# Patient Record
Sex: Female | Born: 1950 | Race: Black or African American | Hispanic: No | Smoking: Former smoker
Health system: Southern US, Community
[De-identification: ages and names within clinical notes are randomized; demographics above are authoritative.]

## PROBLEM LIST (undated history)

## (undated) DIAGNOSIS — N814 Uterovaginal prolapse, unspecified: Secondary | ICD-10-CM

## (undated) DIAGNOSIS — N8110 Cystocele, unspecified: Secondary | ICD-10-CM

## (undated) DIAGNOSIS — I10 Essential (primary) hypertension: Secondary | ICD-10-CM

## (undated) DIAGNOSIS — M1990 Unspecified osteoarthritis, unspecified site: Secondary | ICD-10-CM

## (undated) HISTORY — PX: BREAST SURGERY: SHX581

## (undated) HISTORY — DX: Cystocele, unspecified: N81.10

## (undated) HISTORY — PX: OOPHORECTOMY: SHX86

## (undated) HISTORY — PX: HERNIA REPAIR: SHX51

## (undated) HISTORY — PX: OTHER SURGICAL HISTORY: SHX169

## (undated) HISTORY — PX: KNEE ARTHROSCOPY: SUR90

---

## 1998-05-23 ENCOUNTER — Ambulatory Visit (HOSPITAL_COMMUNITY): Admission: RE | Admit: 1998-05-23 | Discharge: 1998-05-23 | Payer: Self-pay | Admitting: *Deleted

## 1998-11-23 ENCOUNTER — Ambulatory Visit (HOSPITAL_COMMUNITY): Admission: RE | Admit: 1998-11-23 | Discharge: 1998-11-23 | Payer: Self-pay

## 1999-12-22 ENCOUNTER — Ambulatory Visit (HOSPITAL_COMMUNITY): Admission: RE | Admit: 1999-12-22 | Discharge: 1999-12-22 | Payer: Self-pay | Admitting: *Deleted

## 1999-12-22 ENCOUNTER — Encounter: Payer: Self-pay | Admitting: *Deleted

## 2000-11-26 HISTORY — PX: REDUCTION MAMMAPLASTY: SUR839

## 2000-12-30 ENCOUNTER — Ambulatory Visit (HOSPITAL_COMMUNITY): Admission: RE | Admit: 2000-12-30 | Discharge: 2000-12-30 | Payer: Self-pay | Admitting: Internal Medicine

## 2000-12-30 ENCOUNTER — Encounter: Payer: Self-pay | Admitting: Internal Medicine

## 2001-12-31 ENCOUNTER — Ambulatory Visit (HOSPITAL_COMMUNITY): Admission: RE | Admit: 2001-12-31 | Discharge: 2001-12-31 | Payer: Self-pay | Admitting: Internal Medicine

## 2001-12-31 ENCOUNTER — Encounter: Payer: Self-pay | Admitting: Internal Medicine

## 2003-09-24 ENCOUNTER — Ambulatory Visit (HOSPITAL_COMMUNITY): Admission: RE | Admit: 2003-09-24 | Discharge: 2003-09-24 | Payer: Self-pay | Admitting: Internal Medicine

## 2004-05-17 ENCOUNTER — Ambulatory Visit (HOSPITAL_COMMUNITY): Admission: RE | Admit: 2004-05-17 | Discharge: 2004-05-17 | Payer: Self-pay | Admitting: Family Medicine

## 2004-06-13 ENCOUNTER — Encounter (INDEPENDENT_AMBULATORY_CARE_PROVIDER_SITE_OTHER): Payer: Self-pay | Admitting: Specialist

## 2004-06-13 ENCOUNTER — Encounter (INDEPENDENT_AMBULATORY_CARE_PROVIDER_SITE_OTHER): Payer: Self-pay | Admitting: *Deleted

## 2004-06-13 ENCOUNTER — Observation Stay (HOSPITAL_COMMUNITY): Admission: RE | Admit: 2004-06-13 | Discharge: 2004-06-14 | Payer: Self-pay | Admitting: Obstetrics and Gynecology

## 2004-09-27 ENCOUNTER — Ambulatory Visit (HOSPITAL_COMMUNITY): Admission: RE | Admit: 2004-09-27 | Discharge: 2004-09-27 | Payer: Self-pay | Admitting: Obstetrics and Gynecology

## 2005-04-09 ENCOUNTER — Other Ambulatory Visit: Admission: RE | Admit: 2005-04-09 | Discharge: 2005-04-09 | Payer: Self-pay | Admitting: Obstetrics and Gynecology

## 2005-10-09 ENCOUNTER — Ambulatory Visit (HOSPITAL_COMMUNITY): Admission: RE | Admit: 2005-10-09 | Discharge: 2005-10-09 | Payer: Self-pay | Admitting: Oncology

## 2006-03-15 ENCOUNTER — Ambulatory Visit: Payer: Self-pay | Admitting: Gastroenterology

## 2006-03-25 ENCOUNTER — Encounter (INDEPENDENT_AMBULATORY_CARE_PROVIDER_SITE_OTHER): Payer: Self-pay | Admitting: Specialist

## 2006-03-25 ENCOUNTER — Ambulatory Visit: Payer: Self-pay | Admitting: Gastroenterology

## 2006-10-10 ENCOUNTER — Ambulatory Visit (HOSPITAL_COMMUNITY): Admission: RE | Admit: 2006-10-10 | Discharge: 2006-10-10 | Payer: Self-pay | Admitting: Obstetrics and Gynecology

## 2006-12-02 ENCOUNTER — Other Ambulatory Visit: Admission: RE | Admit: 2006-12-02 | Discharge: 2006-12-02 | Payer: Self-pay | Admitting: Obstetrics and Gynecology

## 2007-10-15 ENCOUNTER — Ambulatory Visit (HOSPITAL_COMMUNITY): Admission: RE | Admit: 2007-10-15 | Discharge: 2007-10-15 | Payer: Self-pay | Admitting: Obstetrics and Gynecology

## 2007-12-10 ENCOUNTER — Other Ambulatory Visit: Admission: RE | Admit: 2007-12-10 | Discharge: 2007-12-10 | Payer: Self-pay | Admitting: Obstetrics and Gynecology

## 2008-10-19 ENCOUNTER — Ambulatory Visit (HOSPITAL_COMMUNITY): Admission: RE | Admit: 2008-10-19 | Discharge: 2008-10-19 | Payer: Self-pay | Admitting: Obstetrics and Gynecology

## 2009-03-08 ENCOUNTER — Encounter: Payer: Self-pay | Admitting: Obstetrics and Gynecology

## 2009-03-08 ENCOUNTER — Ambulatory Visit: Payer: Self-pay | Admitting: Obstetrics and Gynecology

## 2009-03-08 ENCOUNTER — Other Ambulatory Visit: Admission: RE | Admit: 2009-03-08 | Discharge: 2009-03-08 | Payer: Self-pay | Admitting: Obstetrics and Gynecology

## 2009-06-08 ENCOUNTER — Ambulatory Visit: Payer: Self-pay | Admitting: Obstetrics and Gynecology

## 2009-06-13 ENCOUNTER — Ambulatory Visit: Payer: Self-pay | Admitting: Obstetrics and Gynecology

## 2009-07-03 ENCOUNTER — Ambulatory Visit (HOSPITAL_COMMUNITY): Admission: RE | Admit: 2009-07-03 | Discharge: 2009-07-03 | Payer: Self-pay | Admitting: Orthopedic Surgery

## 2009-07-20 ENCOUNTER — Ambulatory Visit (HOSPITAL_BASED_OUTPATIENT_CLINIC_OR_DEPARTMENT_OTHER): Admission: RE | Admit: 2009-07-20 | Discharge: 2009-07-20 | Payer: Self-pay | Admitting: Orthopedic Surgery

## 2009-10-26 ENCOUNTER — Ambulatory Visit (HOSPITAL_COMMUNITY): Admission: RE | Admit: 2009-10-26 | Discharge: 2009-10-26 | Payer: Self-pay | Admitting: Emergency Medicine

## 2010-03-15 ENCOUNTER — Other Ambulatory Visit: Admission: RE | Admit: 2010-03-15 | Discharge: 2010-03-15 | Payer: Self-pay | Admitting: Obstetrics and Gynecology

## 2010-03-15 ENCOUNTER — Ambulatory Visit: Payer: Self-pay | Admitting: Obstetrics and Gynecology

## 2010-11-23 ENCOUNTER — Ambulatory Visit (HOSPITAL_COMMUNITY)
Admission: RE | Admit: 2010-11-23 | Discharge: 2010-11-23 | Payer: Self-pay | Source: Home / Self Care | Attending: Obstetrics and Gynecology | Admitting: Obstetrics and Gynecology

## 2011-04-10 NOTE — Op Note (Signed)
Holly Herring, Holly Herring             ACCOUNT NO.:  [id number]   MEDICAL RECORD NO.:  [id number]          PATIENT TYPE:  AMB   LOCATION:  [facility]                         FACILITY:  [facility]   PHYSICIAN:  Georges Lynch. Gioffre, M.D.DATE OF BIRTH:  July 29, 1951   DATE OF PROCEDURE:  07/20/2009  DATE OF DISCHARGE:                               OPERATIVE REPORT   SURGEON:  Dr. Darrelyn Hillock.   ASSISTANT:  Nurse.   PREOPERATIVE DIAGNOSES:  1. Severe degenerative arthritis, left knee.  2. Complex tear of the posterior horn of medial meniscus, left knee.   POSTOPERATIVE DIAGNOSES:  1. Severe degenerative arthritis, left knee.  2. Complex tear of the posterior horn of medial meniscus, left knee.   OPERATION:  1. Diagnostic diagnostic arthroscopy, left knee.  2. Medial meniscectomy, left knee.  3. Abrasion chondroplasty medial femoral condyle, left knee.  4. Synovectomy suprapatellar pouch, left knee.   PROCEDURE:  Under general anesthesia, routine orthopedic prep and  draping of the left lower extremity was carried out.  She was given 500  mg of vancomycin IV.  After prepping and draping was carried out, a  small incision was made in the suprapatellar pouch, inflow cannula was  inserted and knee was distended with saline.  Another small punctate  incision was made in the anterolateral joint.  The arthroscope was  entered from a lateral approach and a complete diagnostic arthroscopy  was carried out.  I went up in the suprapatellar pouch, she had severe  synovitis.  I introduced the ArthroCare and did a synovectomy.  Following that, I went down the lateral joint.  She had definite  arthritic changes, but no meniscal tear.  The cruciates were intact.  I  went over the medial joint, she had a severe complex tear of the  posterior horn of the medial meniscus.  I introduced a shaver suction  device followed by the ArthroCare and did a medial meniscectomy.  There  was no other abnormalities.  The lateral  meniscus was definitely intact.  I did an abrasion chondroplasty utilizing a shaver suction device  followed by the ArthroCare over the medial femoral condyle.  Note, she  had severe changes in the tibial plateau and the medial femoral condyle  and someday she will need a total knee replacement.  The wound was  thoroughly irrigated out with saline solution.  I closed all three  punctate incisions with 3-0 nylon suture.  I injected 30 mL of quarter-  percent Marcaine with epinephrine in the knee joint.  A sterile  Neosporin bundle dressing was applied and the patient left the operative  room in satisfactory condition.   POSTOPERATIVE CARE:  1. She will be on Percocet 10/650 one every 4 hours p.r.n. for pain.  2. She will be on aspirin 325 mg b.i.d. today and b.i.d. for 2 weeks      on a daily basis.  3. She will ambulate with a walker partial to full weightbearing as      tolerated.  4. She will see me in the office in 12-14 days or prior to that for  suture removal.           ______________________________  Georges Lynch. Darrelyn Hillock, M.D.     RAG/MEDQ  D:  07/20/2009  T:  07/20/2009  Job:  [id number]

## 2011-04-13 NOTE — Discharge Summary (Signed)
NAME:  Holly Herring, Holly Herring                       ACCOUNT NO.:  [id number]   MEDICAL RECORD NO.:  [id number]                   PATIENT TYPE:  OBV   LOCATION:  9309                                 FACILITY:  [facility]   PHYSICIAN:  Daniel L. Eda Paschal, M.D.           DATE OF BIRTH:  02-Jun-1951   DATE OF ADMISSION:  06/13/2004  DATE OF DISCHARGE:                                 DISCHARGE SUMMARY   HOSPITAL COURSE:  The patient is a 60 year old female with right lower  quadrant pain and a large ovarian neoplasm who entered the hospital for  surgery.  On the day of admission she was taken to the operating room and  open laparoscopy was performed.  Findings were pelvic adhesive disease,  fibroids, and a large right ovarian neoplasm.  As per our discussion  preoperatively a laparoscopic right salpingo-oophorectomy was performed.  She was kept overnight for observation, by the next morning was doing well,  and was ready for discharge.   DISCHARGE MEDICATIONS:  Percocet for pain relief.   DISCHARGE DIET:  Regular.   DISCHARGE ACTIVITY:  Ambulatory.   Final pathology report is not available at the time of dictation.   CONDITION ON DISCHARGE:  Improved.   DISCHARGE DIAGNOSES:  1. Pelvic adhesive disease.  2. Fibroids.  3. Right ovarian neoplasm.                                               Daniel L. Eda Paschal, M.D.    Tonette Bihari  D:  06/14/2004  T:  06/14/2004  Job:  [id number]

## 2011-04-13 NOTE — Op Note (Signed)
NAME:  Holly Herring, Holly Herring                       ACCOUNT NO.:  [id number]   MEDICAL RECORD NO.:  [id number]                   PATIENT TYPE:  OBV   LOCATION:  9309                                 FACILITY:  [facility]   PHYSICIAN:  Daniel L. Eda Paschal, M.D.           DATE OF BIRTH:  Feb 19, 1951   DATE OF PROCEDURE:  06/13/2004  DATE OF DISCHARGE:                                 OPERATIVE REPORT   PREOPERATIVE DIAGNOSIS:  Right ovarian neoplasm, right lower quadrant pain.   OPERATIONS:  1. Diagnostic laparoscopy with lysis of pelvic adhesions.  2. Right salpingo-oophorectomy.   SURGEON:  Daniel L. Eda Paschal, M.D.   FIRST ASSISTANT:  Timothy P. Fontaine, M.D.   FINDINGS:  At the time of surgery, the patient's uterus was involved with  multiple fibroids.  There were also adhesions on the left adnexal side,  obscuring the left adnexa; although by history it had been removed.  The cul-  de-sac had adhesions as well, although you could see the cul-de-sac.  The  patient's right ovary was enlarged by an ovarian neoplasm of about 5.5 cm,  that felt both firm and cystic.  There were adhesions from a previous  laparotomy, with little windows between the top of the peritoneum and the  fundus of the uterus.  There were adhesions obscuring the vesicouterine fold  of the peritoneum from previous surgery.  The ileocecal junction was  identified and the appendix appeared to be normal.   DESCRIPTION OF PROCEDURE:  After adequate general endotracheal anesthesia,  the patient was placed in the dorsal lithotomy position; prepped and draped  in the usual sterile manner.  A Foley catheter was inserted into the bladder  and a Hulka catheter was inserted into the uterus.  Two attempts were made  to introduce the Veress needle subumbilically to create a pneumoperitoneum;  both times it was felt that the peritoneum had been entered successfully;  however, we could not get good flow of gas.  Therefore the procedure  was  terminated and an open laparoscopy was done.   The skin incision was extended subumbilically from side to side.  It was cut  down to the fascia, which was easily identified.  The fascia was whipped  with a 0 Vicryl.  The peritoneum was then opened by sharp dissection.  The  Hasson catheter was placed and the Vicryl was tied around it to create a  good seal.  The operating laparoscope was placed and a pneumoperitoneum was  then created without any difficulty.  A 5 mm port was placed suprapubically  and 12 mm port was placed in the right lower quadrant.   Some of the adhesions, that obscured a good view of the right adnexa, were  taken down with sharp dissection by scissors -- without any bleeding  encountered.  Also, there were some dense adhesions still on the left side,  where the adnexa by history had been surgically removed.  Once again we  could not see an adnexa on the left, but there was adhesion there as well as  the uterus went all the way to the sidewall.  The patient's uterus was  enlarged by fibroids.   The right adnexa, however, was fairly free; although, there were some  adhesions which were lysed.  The right adnexa was then elevated. The ureter  could be identified.  The right infundibulopelvic ligament was bipolar  cauterized and cut.  Working our way down the broad ligament and staying  close to the adnexa, attachments of the adnexa to the broad ligament were  bipolar cauterized and cut, until the right adnexa was completely free.   An Endo pouch was placed, and the specimen was placed in it.  In trying to  remove it, the specimen was very large and the fascial incisions had to be  extended more in order to get it out -- but, finally this was successful and  the specimen was removed.   The pelvis was inspected and there was absolutely no bleeding.  Please note  that peritoneal washings had been obtained prior to obtaining the specimen;  these were sent to  pathology for tissue diagnosis separately.   At this point the subumbilical fascial incision was closed with the whip  stitch sutured in place before; just by pulling up on it the incision was  completely closed.  That skin incision was closed with a 3-0 plain  subcuticular suture.  The right lower quadrant fascial incision was closed  with a running 0 Vicryl, and then the skin was closed with a 3-0 plain  subcuticular suture.  Steri-Strips were placed in the other incision.   Sponge, needle and instrument counts were correct.  Estimated blood loss was  less than 100 cc, with no replaced.  The patient tolerated the procedure  well and left the operating room in satisfactory condition.  Drained clear  urine from her Foley catheter, which was then removed.                                               Daniel L. Eda Paschal, M.D.    Tonette Bihari  D:  06/13/2004  T:  06/14/2004  Job:  [id number]

## 2011-04-19 ENCOUNTER — Encounter: Payer: Self-pay | Admitting: Gastroenterology

## 2011-07-16 ENCOUNTER — Other Ambulatory Visit: Payer: Self-pay | Admitting: Obstetrics and Gynecology

## 2011-10-15 ENCOUNTER — Other Ambulatory Visit: Payer: Self-pay | Admitting: Obstetrics and Gynecology

## 2011-10-15 NOTE — Telephone Encounter (Signed)
RX DENIED. NO CHECK UP SINCE April 2011. PT. NEVER SET UP AN APPT.

## 2011-10-26 ENCOUNTER — Other Ambulatory Visit: Payer: Self-pay | Admitting: Internal Medicine

## 2011-10-26 DIAGNOSIS — Z1231 Encounter for screening mammogram for malignant neoplasm of breast: Secondary | ICD-10-CM

## 2011-11-30 ENCOUNTER — Telehealth: Payer: Self-pay | Admitting: *Deleted

## 2011-11-30 NOTE — Telephone Encounter (Signed)
Pt is currently unemployed last office visit 4/20/211 her rx for estrace 1 mg has expired. Pt would like to know what you would recommend to take OTC for hot flashes? No sleep in last 4 nights due to the flashes. Please advise

## 2011-11-30 NOTE — Telephone Encounter (Signed)
Pt informed with the below note. 

## 2011-11-30 NOTE — Telephone Encounter (Signed)
Health food stores sell a variety of products for hot flashes. Different things work for different people. I've had the best results with soy  or black cohash.

## 2011-12-03 ENCOUNTER — Ambulatory Visit (HOSPITAL_COMMUNITY)
Admission: RE | Admit: 2011-12-03 | Discharge: 2011-12-03 | Disposition: A | Discharge status: Home / Self Care | Payer: No Typology Code available for payment source | Source: Ambulatory Visit | Attending: Internal Medicine | Admitting: Internal Medicine

## 2011-12-03 DIAGNOSIS — Z1231 Encounter for screening mammogram for malignant neoplasm of breast: Secondary | ICD-10-CM

## 2011-12-07 ENCOUNTER — Encounter: Payer: Self-pay | Admitting: Family Medicine

## 2011-12-07 ENCOUNTER — Ambulatory Visit
Admission: RE | Admit: 2011-12-07 | Discharge: 2011-12-07 | Disposition: A | Discharge status: Home / Self Care | Payer: Self-pay | Source: Ambulatory Visit | Attending: Family Medicine | Admitting: Family Medicine

## 2011-12-07 ENCOUNTER — Ambulatory Visit (INDEPENDENT_AMBULATORY_CARE_PROVIDER_SITE_OTHER): Payer: Self-pay | Admitting: Family Medicine

## 2011-12-07 VITALS — BP 138/80 | HR 86 | Ht 61.0 in | Wt 222.0 lb

## 2011-12-07 DIAGNOSIS — M1712 Unilateral primary osteoarthritis, left knee: Secondary | ICD-10-CM

## 2011-12-07 DIAGNOSIS — IMO0002 Reserved for concepts with insufficient information to code with codable children: Secondary | ICD-10-CM

## 2011-12-07 DIAGNOSIS — M1710 Unilateral primary osteoarthritis, unspecified knee: Secondary | ICD-10-CM

## 2011-12-07 MED ORDER — MELOXICAM 15 MG PO TABS: ORAL_TABLET | ORAL | Status: DC

## 2011-12-07 NOTE — Progress Notes (Signed)
  Subjective:    Patient ID: Holly Herring, female    DOB: 05/21/1951, 61 y.o.   MRN: [id number]  HPI  Holly Herring is coming complaining of left knee pain. This is a chronic condition.  Holly Herring had an arthroscopic surgery 2 years ago for the same condition with findings of degenerative changes in intra-articularly . Holly Herring states that the pain is in the anteromedial side of her knee. In the medial joint line. The pain is 5 intensity, no radiating, worse with weightbearing activities. No mechanical symptoms locking catching or giving away. He denies a numbness or tingling. Holly Herring has been taking Advil for pain control. Holly Herring also has knee sleeve and Holly Herring also has a cane. Mild swelling on and off. The last time Holly Herring had any images of her knees was 2 years ago.  There is no problem list on file for this patient.  Current Outpatient Prescriptions on File Prior to Visit  Medication Sig Dispense Refill  . estradiol (ESTRACE) 1 MG tablet TAKE ONE TABLET BY MOUTH EVERY DAY  30 tablet  0   Allergies  Allergen Reactions  . Penicillins Hives      Review of Systems  Constitutional: Negative for fever, chills, diaphoresis and fatigue.  Musculoskeletal: Positive for arthralgias and gait problem. Negative for back pain and joint swelling.  Neurological: Negative for weakness and numbness.       Objective:   Physical Exam  Constitutional: Holly Herring is oriented to person, place, and time. Holly Herring appears well-developed and well-nourished.       BP 138/80  Pulse 86  Ht 5\' 1"  (1.549 m)  Wt 222 lb (100.699 kg)  BMI 41.95 kg/m2   Pulmonary/Chest: Effort normal.  Musculoskeletal:       Left knee with intact skin, FROM. Patellofemoral crepitus present with flexion and extension. Patellofemoral compression  test +. TTP in medial joint line. No tenderness on the quad neither or patellar tendon. Ligaments intact. Lachman neg. Varus and valgus test at 0 and 30 degres neg Poor quad muscle definition.  Tight  hamstrings Normal gait with a limp favoring the left side      Neurological: Holly Herring is alert and oriented to person, place, and time.  Skin: Skin is warm. No rash noted. No erythema.  Psychiatric: Holly Herring has a normal mood and affect. Her behavior is normal.          Assessment & Plan:   1. Left knee DJD  DG Knee AP/LAT W/Sunrise Left   Ice massage twice a day mobic 1 tablet orally daily for 2 weeks Quad exercises Knee sleeve Knee x rays Weight loss Biking daily F/U in 4 weeks. We discussed the merits of a cortisone and intra-articular injection but the patient to wait on Holly Herring had x-rays of her knees.

## 2011-12-07 NOTE — Patient Instructions (Signed)
Ice massage twice a day mobic 1 tablet orally daily for 2 weeks Quad exercises Knee sleeve Knee x rays Weight loss Biking daily F/U in 4 weeks.

## 2011-12-26 ENCOUNTER — Encounter: Payer: Self-pay | Admitting: Family Medicine

## 2011-12-26 ENCOUNTER — Ambulatory Visit (INDEPENDENT_AMBULATORY_CARE_PROVIDER_SITE_OTHER): Payer: Self-pay | Admitting: Family Medicine

## 2011-12-26 DIAGNOSIS — M25569 Pain in unspecified knee: Secondary | ICD-10-CM

## 2011-12-26 DIAGNOSIS — I10 Essential (primary) hypertension: Secondary | ICD-10-CM | POA: Insufficient documentation

## 2011-12-26 DIAGNOSIS — Z780 Asymptomatic menopausal state: Secondary | ICD-10-CM

## 2011-12-26 MED ORDER — NAPROXEN 500 MG PO TABS: 500.0000 mg | ORAL_TABLET | Freq: Two times a day (BID) | ORAL | Status: DC

## 2011-12-26 MED ORDER — ESTRADIOL 1 MG PO TABS: 1.0000 mg | ORAL_TABLET | Freq: Every day | ORAL | Status: DC

## 2011-12-26 MED ORDER — TRIAMTERENE-HCTZ 37.5-25 MG PO TABS: 1.0000 | ORAL_TABLET | Freq: Every day | ORAL | Status: DC

## 2011-12-26 MED ORDER — LISINOPRIL 10 MG PO TABS: 10.0000 mg | ORAL_TABLET | Freq: Every day | ORAL | Status: DC

## 2011-12-26 MED ORDER — MEDROXYPROGESTERONE ACETATE 5 MG PO TABS: 5.0000 mg | ORAL_TABLET | Freq: Every day | ORAL | Status: DC

## 2011-12-26 NOTE — Progress Notes (Signed)
  Subjective:    Patient ID: Holly Herring, female    DOB: 03/14/1951, 61 y.o.   MRN: [id number]  HPI  New patient to my practice for establishment of care. She has hypertension and has been on medication as it fairly well controlled for some time. She's not having any problems with that medicine. She takes irregular. #2. Postmenopausal and had been on hormone replacement up until about a month ago when she ran out of refills. In the last month she's really had a lot of hot flashes and would like to go back on her replacement. She is a nonsmoker. She's never had a history of DVT. She's only been off the medicine for about 3-4 weeks.  Review of Systems    denies fever, sweats, chills. Denies unusual weight change. Denies shortness of breath with exertion, shortness of breath at rest, denies chest pain, denies lower extremity swelling. Objective:   Physical Exam  Vital signs reviewed GENERALl: Well developed, well nourished, in no acute distress. NECK: Supple, FROM, without lymphadenopathy.  THYROID: normal without nodularity CAROTID ARTERIES: without bruits LUNGS: clear to auscultation bilaterally. No wheezes or rales. HEART: Regular rate and rhythm, no murmurs ABDOMEN: soft with positive bowel sounds NEURO: No gross focal deficits        Assessment & Plan:  #1. Hypertension. Will continue on her current medication. She recently had some blood work done at urgent care and she is going to get that lab work for her so we don't have to repeat it today. Finances are quite itchy for her right now she is unemployed #2. postmenopausal. Would like her hormone replacement refill did not do that. She has been postmenopausal for about 8 years and has been on hormone replacement that entire time except for the last 2 or 4 weeks. #3. Was seen by Dr. Ashley [facility] for knee arthritis. He placed her on meloxicam. She really thinks that Aleve or Naprosyn does better for her. She  wonders if she could take both. Discussed at some length. I will call in prescription strength Naprosyn. She will complete the meloxicam and then discontinue that. She has followup with [place] in sports medicine in a week.

## 2011-12-26 NOTE — Patient Instructions (Signed)
I have called in a rx for naprosyn which is same as aleve. Take one twice a day. It will be cheaper than buying it over the counter. Meloxicam, aleve and naprosyn are all really similar so take ONE of them at a time. Great to meet you

## 2012-01-04 ENCOUNTER — Encounter: Payer: Self-pay | Admitting: Family Medicine

## 2012-01-04 ENCOUNTER — Ambulatory Visit (INDEPENDENT_AMBULATORY_CARE_PROVIDER_SITE_OTHER): Payer: Self-pay | Admitting: Family Medicine

## 2012-01-04 VITALS — BP 146/84

## 2012-01-04 DIAGNOSIS — M25569 Pain in unspecified knee: Secondary | ICD-10-CM

## 2012-01-04 NOTE — Progress Notes (Signed)
  Subjective:    Patient ID: [name], female    DOB: [birth year], 61 y.o.   MRN: [id number]  HPI . Followup left he arthritis. About 20% better with the current treatment plan. She's having no problems with the nonsteroidal medication. #2. Followup recently restarting her hormone replacement. She just has been on it for about 2 days says there was delay in getting it filled at the pharmacy. She's already feeling somewhat better. #3. Hypertension: Said she did not sleep well last night She thinks her blood pressures up a little bit today   Review of Systems Pertinent review of systems: negative for fever or unusual weight change.     Objective:   Physical Exam  Vital signs reviewed. GENERAL: Well developed, well nourished, no acute distress KNEES: Bilaterally full range of motion in extension and flexion. There is no erythema, no warmth, no effusion on either knee. She has crepitus bilaterally that is mild. Distally she is neurovascular intact with soft calves.      Assessment & Plan:  #1. Osteoarthritis knee. Currently doing pretty well with the nonsteroidal medication. She is doing some weight loss and we did discuss water aerobics. I'll see her back in 4-6 weeks for followup. We have not done corticosteroid injection up to this point and that would still be an option. #2. Hormone replacement reinitiated after a 3-4 week absence from medication. She seems to be doing fine with that #3. Hypertension. Slight suboptimal control today, i will  recheck when  see her back

## 2012-02-08 ENCOUNTER — Ambulatory Visit: Payer: Self-pay | Admitting: Family Medicine

## 2012-09-05 ENCOUNTER — Encounter: Payer: Self-pay | Admitting: Family Medicine

## 2012-09-05 ENCOUNTER — Ambulatory Visit (INDEPENDENT_AMBULATORY_CARE_PROVIDER_SITE_OTHER): Payer: Self-pay | Admitting: Family Medicine

## 2012-09-05 VITALS — BP 126/80 | HR 82 | Ht 61.0 in | Wt 210.0 lb

## 2012-09-05 DIAGNOSIS — M25569 Pain in unspecified knee: Secondary | ICD-10-CM

## 2012-09-08 NOTE — Progress Notes (Signed)
  Subjective:    Patient ID: Holly Herring, female    DOB: November 01, 1951, 61 y.o.   MRN: [id number]  HPI  Right knee pain. Worse over last month or so. Last injection helped for many months. Has lost 16 pounds.Pain 6/10 at worst.   Review of Systems Pertinent review of systems: negative for fever or unusual weight change.     Objective:   Physical Exam Vital signs reviewed. GENERAL: Well developed, well nourished, no acute distress KNEE right ttp medial joint line. No effusion. ligamentously intact. Calf is soft. Distally NV intact  INJECTION: Patient was given informed consent, signed copy in the chart. Appropriate time out was taken. Area prepped and draped in usual sterile fashion. 1 cc of methylprednisolone 40 mg/ml plus  4 cc of 1% lidocaine without epinephrine was injected into the right knee using a(n) medial anterior approach. The patient tolerated the procedure well. There were no complications. Post procedure instructions were given.        Assessment & Plan:  DJD knee. csi today Congratulated on wt loss. At some point we will likely need new knee films

## 2012-11-07 ENCOUNTER — Other Ambulatory Visit: Payer: Self-pay | Admitting: Family Medicine

## 2012-11-07 DIAGNOSIS — Z1231 Encounter for screening mammogram for malignant neoplasm of breast: Secondary | ICD-10-CM

## 2012-12-01 ENCOUNTER — Other Ambulatory Visit: Payer: Self-pay | Admitting: Family Medicine

## 2012-12-04 ENCOUNTER — Ambulatory Visit (HOSPITAL_COMMUNITY)
Admission: RE | Admit: 2012-12-04 | Discharge: 2012-12-04 | Disposition: A | Discharge status: Home / Self Care | Payer: No Typology Code available for payment source | Source: Ambulatory Visit | Attending: Family Medicine | Admitting: Family Medicine

## 2012-12-04 DIAGNOSIS — Z1231 Encounter for screening mammogram for malignant neoplasm of breast: Secondary | ICD-10-CM

## 2012-12-12 ENCOUNTER — Ambulatory Visit (INDEPENDENT_AMBULATORY_CARE_PROVIDER_SITE_OTHER): Payer: Self-pay | Admitting: *Deleted

## 2012-12-12 DIAGNOSIS — Z111 Encounter for screening for respiratory tuberculosis: Secondary | ICD-10-CM

## 2012-12-15 ENCOUNTER — Ambulatory Visit (INDEPENDENT_AMBULATORY_CARE_PROVIDER_SITE_OTHER): Payer: Self-pay | Admitting: *Deleted

## 2012-12-15 DIAGNOSIS — Z111 Encounter for screening for respiratory tuberculosis: Secondary | ICD-10-CM

## 2012-12-15 LAB — TB SKIN TEST
Induration: 0 mm
TB Skin Test: NEGATIVE

## 2013-01-02 ENCOUNTER — Encounter: Payer: Self-pay | Admitting: Family Medicine

## 2013-01-02 ENCOUNTER — Ambulatory Visit (INDEPENDENT_AMBULATORY_CARE_PROVIDER_SITE_OTHER): Payer: No Typology Code available for payment source | Admitting: Family Medicine

## 2013-01-02 ENCOUNTER — Other Ambulatory Visit: Payer: Self-pay | Admitting: Family Medicine

## 2013-01-02 ENCOUNTER — Ambulatory Visit
Admission: RE | Admit: 2013-01-02 | Discharge: 2013-01-02 | Disposition: A | Discharge status: Home / Self Care | Payer: PRIVATE HEALTH INSURANCE | Source: Ambulatory Visit | Attending: Family Medicine | Admitting: Family Medicine

## 2013-01-02 VITALS — BP 149/84 | HR 86 | Ht 61.0 in | Wt 203.0 lb

## 2013-01-02 DIAGNOSIS — M25569 Pain in unspecified knee: Secondary | ICD-10-CM

## 2013-01-02 MED ORDER — LISINOPRIL 10 MG PO TABS: 10.0000 mg | ORAL_TABLET | Freq: Every day | ORAL | Status: DC

## 2013-01-02 MED ORDER — MEDROXYPROGESTERONE ACETATE 5 MG PO TABS: 5.0000 mg | ORAL_TABLET | Freq: Every day | ORAL | Status: DC

## 2013-01-02 MED ORDER — ESTRADIOL 1 MG PO TABS: 0.5000 mg | ORAL_TABLET | Freq: Every day | ORAL | Status: DC

## 2013-01-02 MED ORDER — NAPROXEN 500 MG PO TABS: 500.0000 mg | ORAL_TABLET | Freq: Two times a day (BID) | ORAL | Status: DC

## 2013-01-02 NOTE — Progress Notes (Signed)
Holly Herring is a 62 y.o. female who presents to [facility] today for follow up left knee pain.  She was last seen in October where she was found to have moderate DJD. She was treated with a left knee interarticular steroid injection. This provided last relief for about 2 months. Over the past month her knee pain is worsened. She is worsening pain with activity and better with rest. Additionally she notes stiffness and is using a cane to ambulate now.  Additionally she is taking naproxen twice daily for pain which does help.  She is thinking about a knee replacement.   PMH reviewed. Hypertension History  Substance Use Topics  . Smoking status: Former Smoker    Quit date: 11/26/1978  . Smokeless tobacco: Never Used  . Alcohol Use: 0.6 oz/week    1 Glasses of wine per week   ROS as above otherwise neg   Exam:  BP 149/84  Pulse 86  Ht 5\' 1"  (1.549 m)  Wt 203 lb (92.08 kg)  BMI 38.36 kg/m2 Gen: Well NAD MSK: Left knee no effusion Range of motion 0-120 Nontender 1+ retropatellar crepitations on extension Negative Lachman's, McMurray's, valgus varus stress.  Pulses capillary refill and sensation are intact distally  Knee injection. Consent obtained and timeout performed. Patient seated in a comfortable position with legs hanging off the table.  The medial Peri-patellar tendon space was palpated and marked. The skin was then cleaned with alcohol. Cold spray applied. A 25-gauge inch and a half needle was used to inject 40 mg of Depo-Medrol and 4 mL of 0.5% Marcaine. Patient tolerated procedure well no bleeding. Pain improved following injection

## 2013-01-02 NOTE — Progress Notes (Signed)
[facility]: Attending Note: I have reviewed the chart, discussed wit the Sports Medicine Fellow. I agree with assessment and treatment plan as detailed in the Fellow's note.  

## 2013-01-02 NOTE — Patient Instructions (Addendum)
Thank you for coming in today. We will try an injection.  We will also get repeat xrays.  If they look bad we will call and send you to the orthopedic surgeon to consider a knee replacement.  Come back as needed.

## 2013-01-02 NOTE — Assessment & Plan Note (Signed)
Secondary to DJD. Plan:  Repeat corticosteroid injection today New knee films If DJD severe refer to orthopedics for joint placement consultation. Followup when necessary

## 2013-01-16 ENCOUNTER — Other Ambulatory Visit: Payer: Self-pay | Admitting: Family Medicine

## 2013-02-02 ENCOUNTER — Telehealth: Payer: Self-pay | Admitting: *Deleted

## 2013-02-02 NOTE — Telephone Encounter (Signed)
Called pt- gave her Lt knee x-ray results.

## 2013-02-02 NOTE — Telephone Encounter (Signed)
Message copied by Mora Bellman on Mon Feb 02, 2013  4:57 PM ------      Message from: CERESI, MELANIE L      Created: Mon Feb 02, 2013  3:09 PM      Regarding: xray results      Contact: [phone]       Pt would like a call back with left knee xray results ------

## 2013-02-27 ENCOUNTER — Encounter: Payer: Self-pay | Admitting: Family Medicine

## 2013-02-27 ENCOUNTER — Ambulatory Visit (INDEPENDENT_AMBULATORY_CARE_PROVIDER_SITE_OTHER): Payer: PRIVATE HEALTH INSURANCE | Admitting: Family Medicine

## 2013-02-27 VITALS — BP 144/87 | HR 81 | Ht 61.0 in | Wt 203.0 lb

## 2013-02-27 DIAGNOSIS — M25569 Pain in unspecified knee: Secondary | ICD-10-CM

## 2013-02-27 DIAGNOSIS — M25562 Pain in left knee: Secondary | ICD-10-CM

## 2013-02-27 NOTE — Patient Instructions (Addendum)
Thank you for coming in today. You need a knee replacement.  We will get you to Dr. Luiz Blare group.  We are happy to see you back as needed.   You have been scheduled for an appt with Dr. Seleta Rhymes on 03/05/13/ @ 10:45am at [facility] ortho.

## 2013-02-27 NOTE — Assessment & Plan Note (Signed)
Secondary to DJD. Plan: Discuss possibility of viscous supplementation versus direct referral to orthopedics for or near placement. Patient agrees and this very marginal and likely not to be beneficial in her case. Refer to [facility] orthopedic for knee replacement

## 2013-02-27 NOTE — Progress Notes (Signed)
[facility]: Attending Note: I have reviewed the chart, discussed wit the Sports Medicine Fellow. I agree with assessment and treatment plan as detailed in the Fellow's note.  

## 2013-02-27 NOTE — Progress Notes (Signed)
Holly Herring is a 62 y.o. female who presents to [facility] today for left knee pain. Patient has continued chronic left knee pain. She's had 2 quarts her injections recently which only lasted several weeks. She is a recent left knee x-ray in February 2014 which is moderate to severe DJD. She notes that her pain and stiffness prevent her from enjoying her normal activities of daily living such as walking her work and shopping. She is ready to consider a knee replacement.  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    PMH reviewed.  History  Substance Use Topics  . Smoking status: Former Smoker    Quit date: 11/26/1978  . Smokeless tobacco: Never Used  . Alcohol Use: 0.6 oz/week    1 Glasses of wine per week   ROS as above otherwise neg   Exam:  BP 144/87  Pulse 81  Ht 5\' 1"  (1.549 m)  Wt 203 lb (92.08 kg)  BMI 38.38 kg/m2 Gen: Well NAD MSK: Left knee no effusion  Range of motion 0-120  Nontender  1+ retropatellar crepitations on extension  Negative Lachman's, McMurray's, valgus varus stress.  Pulses capillary refill and sensation are intact distally   No results found.

## 2013-03-09 ENCOUNTER — Other Ambulatory Visit: Payer: Self-pay | Admitting: Orthopedic Surgery

## 2013-03-11 ENCOUNTER — Other Ambulatory Visit: Payer: Self-pay | Admitting: Family Medicine

## 2013-03-25 ENCOUNTER — Encounter (HOSPITAL_COMMUNITY): Payer: Self-pay | Admitting: Pharmacy Technician

## 2013-04-03 NOTE — H&P (Signed)
TOTAL KNEE ADMISSION H&P  Patient is being admitted for left total knee arthroplasty.  Subjective:  Chief Complaint:left knee pain.  HPI: Holly Herring, 62 y.o. female, has a history of pain and functional disability in the left knee due to arthritis and has failed non-surgical conservative treatments for greater than 12 weeks to includeNSAID's and/or analgesics, corticosteriod injections and use of assistive devices.  Onset of symptoms was gradual, starting 4 years ago with gradually worsening course since that time. The patient noted prior procedures on the knee to include  arthroscopy on the left knee(s).  Patient currently rates pain in the left knee(s) at 7 out of 10 with activity. Patient has night pain, worsening of pain with activity and weight bearing and pain that interferes with activities of daily living.  Patient has evidence of periarticular osteophytes and joint space narrowing by imaging studies. There is no active infection.  Patient Active Problem List   Diagnosis Date Noted  . Hypertension 12/26/2011  . Post menopausal problems 12/26/2011  . Knee pain 12/26/2011   No past medical history on file.  Past Surgical History  Procedure Laterality Date  . Knee arthroscopy      Dr Hollice Gong  . Hernia repair    . Oophorectomy      one ovary removed due to ectopic    No prescriptions prior to admission   Allergies  Allergen Reactions  . Penicillins Hives    History  Substance Use Topics  . Smoking status: Former Smoker    Quit date: 11/26/1978  . Smokeless tobacco: Never Used  . Alcohol Use: 0.6 oz/week    1 Glasses of wine per week    Family History  Problem Relation Age of Onset  . Heart disease Mother   . Heart disease Brother      Review of Systems  Constitutional: Positive for weight loss.  HENT: Negative.   Eyes: Negative.   Respiratory: Negative.   Cardiovascular: Negative.   Gastrointestinal: Negative.   Genitourinary: Negative.    Musculoskeletal: Positive for joint pain.  Skin: Negative.   Neurological: Negative.   Endo/Heme/Allergies: Negative.   Psychiatric/Behavioral: Positive for depression.    Objective:  Physical Exam  Constitutional: She is oriented to person, place, and time. She appears well-developed and well-nourished.  HENT:  Head: Normocephalic.  Eyes: Pupils are equal, round, and reactive to light.  Cardiovascular: Intact distal pulses.   Respiratory: Effort normal.  Musculoskeletal:       Left knee: She exhibits decreased range of motion and swelling. Tenderness found. Medial joint line tenderness noted.  Neurological: She is alert and oriented to person, place, and time.  Psychiatric: She has a normal mood and affect.    Vital signs in last 24 hours:    Labs:   Estimated body mass index is 38.38 kg/(m^2) as calculated from the following:   Height as of 02/27/13: 5\' 1"  (1.549 m).   Weight as of 02/27/13: 92.08 kg (203 lb).   Imaging Review Plain radiographs demonstrate severe degenerative joint disease of the left knee(s). The overall alignment ismild varus. The bone quality appears to be adequate for age and reported activity level.  Assessment/Plan:  End stage arthritis, left knee   The patient history, physical examination, clinical judgment of the provider and imaging studies are consistent with end stage degenerative joint disease of the left knee(s) and total knee arthroplasty is deemed medically necessary. The treatment options including medical management, injection therapy arthroscopy and arthroplasty were discussed at  length. The risks and benefits of total knee arthroplasty were presented and reviewed. The risks due to aseptic loosening, infection, stiffness, patella tracking problems, thromboembolic complications and other imponderables were discussed. The patient acknowledged the explanation, agreed to proceed with the plan and consent was signed. Patient is being admitted for  inpatient treatment for surgery, pain control, PT, OT, prophylactic antibiotics, VTE prophylaxis, progressive ambulation and ADL's and discharge planning. The patient is planning to be discharged home with home health services

## 2013-04-06 ENCOUNTER — Encounter (HOSPITAL_COMMUNITY)
Admission: RE | Admit: 2013-04-06 | Discharge: 2013-04-06 | Disposition: A | Discharge status: Home / Self Care | Payer: PRIVATE HEALTH INSURANCE | Source: Ambulatory Visit | Attending: Orthopedic Surgery | Admitting: Orthopedic Surgery

## 2013-04-06 ENCOUNTER — Encounter (HOSPITAL_COMMUNITY): Payer: Self-pay

## 2013-04-06 DIAGNOSIS — Z01812 Encounter for preprocedural laboratory examination: Secondary | ICD-10-CM | POA: Insufficient documentation

## 2013-04-06 DIAGNOSIS — Z01810 Encounter for preprocedural cardiovascular examination: Secondary | ICD-10-CM | POA: Insufficient documentation

## 2013-04-06 DIAGNOSIS — Z01818 Encounter for other preprocedural examination: Secondary | ICD-10-CM | POA: Insufficient documentation

## 2013-04-06 HISTORY — DX: Unspecified osteoarthritis, unspecified site: M19.90

## 2013-04-06 HISTORY — DX: Essential (primary) hypertension: I10

## 2013-04-06 LAB — URINALYSIS, ROUTINE W REFLEX MICROSCOPIC
Bilirubin Urine: NEGATIVE
Glucose, UA: NEGATIVE mg/dL
Ketones, ur: NEGATIVE mg/dL
Specific Gravity, Urine: 1.018 (ref 1.005–1.030)
pH: 6.5 (ref 5.0–8.0)

## 2013-04-06 LAB — BASIC METABOLIC PANEL
BUN: 19 mg/dL (ref 6–23)
CO2: 28 mEq/L (ref 19–32)
Chloride: 101 mEq/L (ref 96–112)
Creatinine, Ser: 0.74 mg/dL (ref 0.50–1.10)
GFR calc Af Amer: >90 mL/min (ref >90)
Glucose, Bld: 84 mg/dL (ref 70–99)

## 2013-04-06 LAB — CBC WITH DIFFERENTIAL/PLATELET
Basophils Absolute: 0 10*3/uL (ref 0.0–0.1)
Basophils Relative: 0 % (ref 0–1)
Eosinophils Absolute: 0.1 10*3/uL (ref 0.0–0.7)
HCT: 38.4 % (ref 36.0–46.0)
Lymphs Abs: 2.8 10*3/uL (ref 0.7–4.0)
MCH: 27.8 pg (ref 26.0–34.0)
MCHC: 33.6 g/dL (ref 30.0–36.0)
MCV: 82.8 fL (ref 78.0–100.0)
Monocytes Absolute: 0.9 10*3/uL (ref 0.1–1.0)
Neutro Abs: 3.6 10*3/uL (ref 1.7–7.7)
RDW: 14.7 % (ref 11.5–15.5)

## 2013-04-06 LAB — SURGICAL PCR SCREEN: Staphylococcus aureus: NEGATIVE

## 2013-04-06 NOTE — Pre-Procedure Instructions (Signed)
Holly Herring  04/06/2013   Your procedure is scheduled on:  04-13-2013   Monday   Report to [facility] at 8:00 AM.  Call this number if you have problems the morning of surgery: [phone]   Remember:   Do not eat food or drink liquids after midnight.    Take these medicines the morning of surgery with A SIP OF WATER: estradiol,provera,   Do not wear jewelry, make-up or nail polish.  Do not wear lotions, powders, or perfumes. You may wear deodorant.  Do not shave 48 hours prior to surgery.   Do not bring valuables to the hospital.  Contacts, dentures or bridgework may not be worn into surgery.  Leave suitcase in the car. After surgery it may be brought to your room.   For patients admitted to the hospital, checkout time is 11:00 AM the day of discharge.       Special Instructions: Shower using CHG 2 nights before surgery and the night before surgery.  If you shower the day of surgery use CHG.  Use special wash - you have one bottle of CHG for all showers.  You should use approximately 1/3 of the bottle for each shower.   Please read over the following fact sheets that you were given: Pain Booklet, Coughing and Deep Breathing, Blood Transfusion Information and MRSA Information

## 2013-04-06 NOTE — Progress Notes (Signed)
Dr.Rowan's office notified that pt. Refuses blood and blood products.

## 2013-04-07 NOTE — Progress Notes (Signed)
This is a 63 year old female who is scheduled for left knee arthroplasty by Dr. Gean Birchwood on 13 Apr 2013. An EKG was performed on 06 Apr 2013 which has been conformed by cardiology. IMPRESSION: Normal sinus rhythm (ventricular rate 72 bpm), within normal limits. Her past medical history is significant for hypertension which is managed with Lisinopril 10 mg daily and Maxide 37.5.25 daily. May proceed with surgery as scheduled.

## 2013-04-12 MED ORDER — VANCOMYCIN HCL IN DEXTROSE 1-5 GM/200ML-% IV SOLN
1000.0000 mg | INTRAVENOUS | Status: AC
  Administered 2013-04-13: 1000 mg via INTRAVENOUS
  Filled 2013-04-12: qty 200

## 2013-04-13 ENCOUNTER — Encounter (HOSPITAL_COMMUNITY): Payer: Self-pay | Admitting: *Deleted

## 2013-04-13 ENCOUNTER — Inpatient Hospital Stay (HOSPITAL_COMMUNITY)
Admission: RE | Admit: 2013-04-13 | Discharge: 2013-04-15 | DRG: 470 | Disposition: A | Discharge status: Home / Self Care | Payer: PRIVATE HEALTH INSURANCE | Source: Ambulatory Visit | Attending: Orthopedic Surgery | Admitting: Orthopedic Surgery

## 2013-04-13 ENCOUNTER — Encounter (HOSPITAL_COMMUNITY): Payer: Self-pay | Admitting: Anesthesiology

## 2013-04-13 ENCOUNTER — Ambulatory Visit (HOSPITAL_COMMUNITY): Payer: PRIVATE HEALTH INSURANCE | Admitting: Anesthesiology

## 2013-04-13 ENCOUNTER — Encounter (HOSPITAL_COMMUNITY)
Admission: RE | Disposition: A | Discharge status: Home / Self Care | Payer: Self-pay | Source: Ambulatory Visit | Attending: Orthopedic Surgery

## 2013-04-13 DIAGNOSIS — Z87891 Personal history of nicotine dependence: Secondary | ICD-10-CM

## 2013-04-13 DIAGNOSIS — N959 Unspecified menopausal and perimenopausal disorder: Secondary | ICD-10-CM | POA: Diagnosis present

## 2013-04-13 DIAGNOSIS — M1712 Unilateral primary osteoarthritis, left knee: Secondary | ICD-10-CM | POA: Diagnosis present

## 2013-04-13 DIAGNOSIS — M129 Arthropathy, unspecified: Secondary | ICD-10-CM | POA: Diagnosis present

## 2013-04-13 DIAGNOSIS — I10 Essential (primary) hypertension: Secondary | ICD-10-CM | POA: Diagnosis present

## 2013-04-13 DIAGNOSIS — Z8249 Family history of ischemic heart disease and other diseases of the circulatory system: Secondary | ICD-10-CM

## 2013-04-13 DIAGNOSIS — M1710 Unilateral primary osteoarthritis, unspecified knee: Principal | ICD-10-CM | POA: Diagnosis present

## 2013-04-13 DIAGNOSIS — Z880 Allergy status to penicillin: Secondary | ICD-10-CM

## 2013-04-13 HISTORY — PX: TOTAL KNEE ARTHROPLASTY: SHX125

## 2013-04-13 SURGERY — ARTHROPLASTY, KNEE, TOTAL
Anesthesia: General | Site: Knee | Laterality: Left | Wound class: Clean

## 2013-04-13 MED ORDER — MEPERIDINE HCL 25 MG/ML IJ SOLN: 6.2500 mg | INTRAMUSCULAR | Status: DC | PRN

## 2013-04-13 MED ORDER — METHOCARBAMOL 500 MG PO TABS
500.0000 mg | ORAL_TABLET | Freq: Four times a day (QID) | ORAL | Status: DC | PRN
  Administered 2013-04-13 – 2013-04-15 (×6): 500 mg via ORAL
  Filled 2013-04-13 (×6): qty 1

## 2013-04-13 MED ORDER — ZOLPIDEM TARTRATE 5 MG PO TABS
5.0000 mg | ORAL_TABLET | Freq: Every evening | ORAL | Status: DC | PRN
  Administered 2013-04-14 – 2013-04-15 (×3): 5 mg via ORAL
  Filled 2013-04-13 (×3): qty 1

## 2013-04-13 MED ORDER — ACETAMINOPHEN 10 MG/ML IV SOLN
1000.0000 mg | Freq: Four times a day (QID) | INTRAVENOUS | Status: AC
  Administered 2013-04-13 – 2013-04-14 (×3): 1000 mg via INTRAVENOUS
  Filled 2013-04-13 (×4): qty 100

## 2013-04-13 MED ORDER — SODIUM CHLORIDE 0.9 % IJ SOLN
INTRAMUSCULAR | Status: DC | PRN
  Administered 2013-04-13: 12:00:00

## 2013-04-13 MED ORDER — DIPHENHYDRAMINE HCL 25 MG PO TABS
25.0000 mg | ORAL_TABLET | Freq: Four times a day (QID) | ORAL | Status: DC | PRN
  Filled 2013-04-13: qty 1

## 2013-04-13 MED ORDER — OXYCODONE HCL 5 MG PO TABS
ORAL_TABLET | ORAL | Status: AC
  Filled 2013-04-13: qty 1

## 2013-04-13 MED ORDER — DIPHENHYDRAMINE HCL 50 MG/ML IJ SOLN
INTRAMUSCULAR | Status: AC
  Administered 2013-04-13: 12.5 mg
  Filled 2013-04-13: qty 1

## 2013-04-13 MED ORDER — METOCLOPRAMIDE HCL 5 MG/ML IJ SOLN: 5.0000 mg | Freq: Three times a day (TID) | INTRAMUSCULAR | Status: DC | PRN

## 2013-04-13 MED ORDER — FLEET ENEMA 7-19 GM/118ML RE ENEM: 1.0000 | ENEMA | Freq: Once | RECTAL | Status: AC | PRN

## 2013-04-13 MED ORDER — HYDROMORPHONE HCL PF 1 MG/ML IJ SOLN
INTRAMUSCULAR | Status: AC
  Filled 2013-04-13: qty 2

## 2013-04-13 MED ORDER — KCL IN DEXTROSE-NACL 20-5-0.45 MEQ/L-%-% IV SOLN
INTRAVENOUS | Status: DC
  Administered 2013-04-13: 21:00:00 via INTRAVENOUS
  Filled 2013-04-13 (×11): qty 1000

## 2013-04-13 MED ORDER — HYDROMORPHONE HCL PF 1 MG/ML IJ SOLN
0.2500 mg | INTRAMUSCULAR | Status: DC | PRN
  Administered 2013-04-13 (×3): 0.5 mg via INTRAVENOUS

## 2013-04-13 MED ORDER — PHENOL 1.4 % MT LIQD: 1.0000 | OROMUCOSAL | Status: DC | PRN

## 2013-04-13 MED ORDER — CHLORHEXIDINE GLUCONATE 4 % EX LIQD: 60.0000 mL | Freq: Once | CUTANEOUS | Status: DC

## 2013-04-13 MED ORDER — MEDROXYPROGESTERONE ACETATE 5 MG PO TABS
5.0000 mg | ORAL_TABLET | Freq: Every day | ORAL | Status: DC
  Administered 2013-04-13 – 2013-04-15 (×3): 5 mg via ORAL
  Filled 2013-04-13 (×3): qty 1

## 2013-04-13 MED ORDER — ACETAMINOPHEN 325 MG PO TABS: 650.0000 mg | ORAL_TABLET | Freq: Four times a day (QID) | ORAL | Status: DC | PRN

## 2013-04-13 MED ORDER — PROPOFOL 10 MG/ML IV BOLUS
INTRAVENOUS | Status: DC | PRN
  Administered 2013-04-13: 150 mg via INTRAVENOUS

## 2013-04-13 MED ORDER — DEXTROSE-NACL 5-0.45 % IV SOLN: INTRAVENOUS | Status: DC

## 2013-04-13 MED ORDER — MENTHOL 3 MG MT LOZG: 1.0000 | LOZENGE | OROMUCOSAL | Status: DC | PRN

## 2013-04-13 MED ORDER — BUPIVACAINE LIPOSOME 1.3 % IJ SUSP
20.0000 mL | Freq: Once | INTRAMUSCULAR | Status: DC
  Filled 2013-04-13: qty 20

## 2013-04-13 MED ORDER — HYDROMORPHONE HCL 2 MG PO TABS
2.0000 mg | ORAL_TABLET | ORAL | Status: DC | PRN
Start: 2013-04-13 — End: 2013-04-15
  Administered 2013-04-13 – 2013-04-15 (×9): 4 mg via ORAL
  Filled 2013-04-13 (×10): qty 2

## 2013-04-13 MED ORDER — DIPHENHYDRAMINE HCL 12.5 MG/5ML PO ELIX
12.5000 mg | ORAL_SOLUTION | ORAL | Status: DC | PRN
  Administered 2013-04-13: 25 mg via ORAL
  Filled 2013-04-13: qty 10

## 2013-04-13 MED ORDER — BISACODYL 5 MG PO TBEC: 5.0000 mg | DELAYED_RELEASE_TABLET | Freq: Every day | ORAL | Status: DC | PRN

## 2013-04-13 MED ORDER — ACETAMINOPHEN 10 MG/ML IV SOLN
INTRAVENOUS | Status: AC
  Administered 2013-04-13: 1000 mg via INTRAVENOUS
  Filled 2013-04-13: qty 100

## 2013-04-13 MED ORDER — ONDANSETRON HCL 4 MG/2ML IJ SOLN
INTRAMUSCULAR | Status: DC | PRN
  Administered 2013-04-13: 4 mg via INTRAVENOUS

## 2013-04-13 MED ORDER — OXYCODONE HCL 5 MG/5ML PO SOLN: 5.0000 mg | Freq: Once | ORAL | Status: AC | PRN

## 2013-04-13 MED ORDER — ASPIRIN EC 325 MG PO TBEC
325.0000 mg | DELAYED_RELEASE_TABLET | Freq: Two times a day (BID) | ORAL | Status: DC
  Administered 2013-04-13 – 2013-04-15 (×4): 325 mg via ORAL
  Filled 2013-04-13 (×5): qty 1

## 2013-04-13 MED ORDER — TRANEXAMIC ACID 100 MG/ML IV SOLN
1000.0000 mg | INTRAVENOUS | Status: AC
  Administered 2013-04-13: 1000 mg via INTRAVENOUS
  Filled 2013-04-13: qty 10

## 2013-04-13 MED ORDER — LACTATED RINGERS IV SOLN
INTRAVENOUS | Status: DC
  Administered 2013-04-13: 09:00:00 via INTRAVENOUS

## 2013-04-13 MED ORDER — SODIUM CHLORIDE 0.9 % IR SOLN
Status: DC | PRN
  Administered 2013-04-13: 1

## 2013-04-13 MED ORDER — HYDROMORPHONE HCL PF 1 MG/ML IJ SOLN
1.0000 mg | INTRAMUSCULAR | Status: DC | PRN
  Administered 2013-04-13 – 2013-04-14 (×3): 1 mg via INTRAVENOUS
  Filled 2013-04-13 (×2): qty 1

## 2013-04-13 MED ORDER — METHOCARBAMOL 500 MG PO TABS
ORAL_TABLET | ORAL | Status: AC
  Filled 2013-04-13: qty 1

## 2013-04-13 MED ORDER — FENTANYL CITRATE 0.05 MG/ML IJ SOLN
INTRAMUSCULAR | Status: DC | PRN
  Administered 2013-04-13 (×3): 50 ug via INTRAVENOUS
  Administered 2013-04-13: 25 ug via INTRAVENOUS
  Administered 2013-04-13 (×8): 50 ug via INTRAVENOUS
  Administered 2013-04-13: 25 ug via INTRAVENOUS
  Administered 2013-04-13: 50 ug via INTRAVENOUS

## 2013-04-13 MED ORDER — MAGNESIUM HYDROXIDE 400 MG/5ML PO SUSP: 30.0000 mL | Freq: Every day | ORAL | Status: DC | PRN

## 2013-04-13 MED ORDER — OXYCODONE HCL 5 MG PO TABS
5.0000 mg | ORAL_TABLET | Freq: Once | ORAL | Status: AC | PRN
Start: 2013-04-13 — End: 2013-04-13
  Administered 2013-04-13: 13:00:00 via ORAL

## 2013-04-13 MED ORDER — LISINOPRIL 10 MG PO TABS
10.0000 mg | ORAL_TABLET | Freq: Every day | ORAL | Status: DC
  Administered 2013-04-13 – 2013-04-15 (×3): 10 mg via ORAL
  Filled 2013-04-13 (×3): qty 1

## 2013-04-13 MED ORDER — LACTATED RINGERS IV SOLN
INTRAVENOUS | Status: DC | PRN
  Administered 2013-04-13 (×3): via INTRAVENOUS

## 2013-04-13 MED ORDER — ONDANSETRON HCL 4 MG/2ML IJ SOLN: 4.0000 mg | Freq: Four times a day (QID) | INTRAMUSCULAR | Status: DC | PRN

## 2013-04-13 MED ORDER — ESTRADIOL 1 MG PO TABS
0.5000 mg | ORAL_TABLET | Freq: Every day | ORAL | Status: DC
  Administered 2013-04-14 – 2013-04-15 (×2): 0.5 mg via ORAL
  Filled 2013-04-13 (×3): qty 0.5

## 2013-04-13 MED ORDER — METHOCARBAMOL 100 MG/ML IJ SOLN
500.0000 mg | Freq: Four times a day (QID) | INTRAMUSCULAR | Status: DC | PRN
  Filled 2013-04-13: qty 5

## 2013-04-13 MED ORDER — TRIAMTERENE-HCTZ 37.5-25 MG PO TABS
1.0000 | ORAL_TABLET | Freq: Every day | ORAL | Status: DC
  Administered 2013-04-13 – 2013-04-15 (×3): 1 via ORAL
  Filled 2013-04-13 (×3): qty 1

## 2013-04-13 MED ORDER — VANCOMYCIN HCL 1000 MG IV SOLR
INTRAVENOUS | Status: DC | PRN
  Administered 2013-04-13: 1000 mg

## 2013-04-13 MED ORDER — SODIUM CHLORIDE 0.9 % IR SOLN
Status: DC | PRN
  Administered 2013-04-13: 3000 mL

## 2013-04-13 MED ORDER — ONDANSETRON HCL 4 MG PO TABS: 4.0000 mg | ORAL_TABLET | Freq: Four times a day (QID) | ORAL | Status: DC | PRN

## 2013-04-13 MED ORDER — ACETAMINOPHEN 650 MG RE SUPP: 650.0000 mg | Freq: Four times a day (QID) | RECTAL | Status: DC | PRN

## 2013-04-13 MED ORDER — CEFUROXIME SODIUM 1.5 G IJ SOLR
INTRAMUSCULAR | Status: AC
  Filled 2013-04-13: qty 1.5

## 2013-04-13 MED ORDER — METOCLOPRAMIDE HCL 10 MG PO TABS: 5.0000 mg | ORAL_TABLET | Freq: Three times a day (TID) | ORAL | Status: DC | PRN

## 2013-04-13 MED ORDER — LIDOCAINE HCL (CARDIAC) 20 MG/ML IV SOLN
INTRAVENOUS | Status: DC | PRN
  Administered 2013-04-13: 100 mg via INTRAVENOUS

## 2013-04-13 MED ORDER — ARTIFICIAL TEARS OP OINT
TOPICAL_OINTMENT | OPHTHALMIC | Status: DC | PRN
  Administered 2013-04-13: 1 via OPHTHALMIC

## 2013-04-13 MED ORDER — MIDAZOLAM HCL 5 MG/5ML IJ SOLN
INTRAMUSCULAR | Status: DC | PRN
  Administered 2013-04-13: 2 mg via INTRAVENOUS

## 2013-04-13 MED ORDER — HYDROMORPHONE HCL PF 1 MG/ML IJ SOLN
INTRAMUSCULAR | Status: AC
  Filled 2013-04-13: qty 1

## 2013-04-13 MED ORDER — VITAMIN C 500 MG PO TABS
500.0000 mg | ORAL_TABLET | Freq: Every day | ORAL | Status: DC
  Administered 2013-04-13 – 2013-04-15 (×3): 500 mg via ORAL
  Filled 2013-04-13 (×3): qty 1

## 2013-04-13 MED ORDER — ALUM & MAG HYDROXIDE-SIMETH 200-200-20 MG/5ML PO SUSP: 30.0000 mL | ORAL | Status: DC | PRN

## 2013-04-13 MED ORDER — ONDANSETRON HCL 4 MG/2ML IJ SOLN: 4.0000 mg | Freq: Once | INTRAMUSCULAR | Status: DC | PRN

## 2013-04-13 SURGICAL SUPPLY — 55 items
BANDAGE ESMARK 6X9 LF (GAUZE/BANDAGES/DRESSINGS) ×1 IMPLANT
BLADE SAG 18X100X1.27 (BLADE) ×2 IMPLANT
BLADE SAW SGTL 13X75X1.27 (BLADE) ×2 IMPLANT
BLADE SURG ROTATE 9660 (MISCELLANEOUS) IMPLANT
BNDG ELASTIC 6X10 VLCR STRL LF (GAUZE/BANDAGES/DRESSINGS) ×2 IMPLANT
BNDG ESMARK 6X9 LF (GAUZE/BANDAGES/DRESSINGS) ×2
BOWL SMART MIX CTS (DISPOSABLE) ×2 IMPLANT
CEMENT HV SMART SET (Cement) ×4 IMPLANT
CLOTH BEACON ORANGE TIMEOUT ST (SAFETY) ×2 IMPLANT
COVER SURGICAL LIGHT HANDLE (MISCELLANEOUS) ×2 IMPLANT
CUFF TOURNIQUET SINGLE 34IN LL (TOURNIQUET CUFF) ×2 IMPLANT
CUFF TOURNIQUET SINGLE 44IN (TOURNIQUET CUFF) IMPLANT
DECANTER SPIKE VIAL GLASS SM (MISCELLANEOUS) ×2 IMPLANT
DRAPE EXTREMITY T 121X128X90 (DRAPE) ×2 IMPLANT
DRAPE U-SHAPE 47X51 STRL (DRAPES) ×2 IMPLANT
DURAPREP 26ML APPLICATOR (WOUND CARE) ×2 IMPLANT
ELECT REM PT RETURN 9FT ADLT (ELECTROSURGICAL) ×2
ELECTRODE REM PT RTRN 9FT ADLT (ELECTROSURGICAL) ×1 IMPLANT
EVACUATOR 1/8 PVC DRAIN (DRAIN) ×2 IMPLANT
GAUZE XEROFORM 1X8 LF (GAUZE/BANDAGES/DRESSINGS) ×2 IMPLANT
GLOVE BIO SURGEON STRL SZ7 (GLOVE) ×2 IMPLANT
GLOVE BIO SURGEON STRL SZ7.5 (GLOVE) ×2 IMPLANT
GLOVE BIOGEL PI IND STRL 7.0 (GLOVE) ×1 IMPLANT
GLOVE BIOGEL PI IND STRL 8 (GLOVE) ×1 IMPLANT
GLOVE BIOGEL PI INDICATOR 7.0 (GLOVE) ×1
GLOVE BIOGEL PI INDICATOR 8 (GLOVE) ×1
GLOVE SURG SS PI 6.5 STRL IVOR (GLOVE) ×2 IMPLANT
GOWN PREVENTION PLUS XLARGE (GOWN DISPOSABLE) ×2 IMPLANT
GOWN STRL NON-REIN LRG LVL3 (GOWN DISPOSABLE) ×6 IMPLANT
HANDPIECE INTERPULSE COAX TIP (DISPOSABLE) ×1
HOOD PEEL AWAY FACE SHEILD DIS (HOOD) ×6 IMPLANT
KIT BASIN OR (CUSTOM PROCEDURE TRAY) ×2 IMPLANT
KIT ROOM TURNOVER OR (KITS) ×2 IMPLANT
MANIFOLD NEPTUNE II (INSTRUMENTS) ×2 IMPLANT
NEEDLE 18GX1X1/2 (RX/OR ONLY) (NEEDLE) ×2 IMPLANT
NEEDLE HYPO 25GX1X1/2 BEV (NEEDLE) ×2 IMPLANT
NS IRRIG 1000ML POUR BTL (IV SOLUTION) ×2 IMPLANT
PACK TOTAL JOINT (CUSTOM PROCEDURE TRAY) ×2 IMPLANT
PAD ARMBOARD 7.5X6 YLW CONV (MISCELLANEOUS) ×4 IMPLANT
PADDING CAST COTTON 6X4 STRL (CAST SUPPLIES) ×2 IMPLANT
SET HNDPC FAN SPRY TIP SCT (DISPOSABLE) ×1 IMPLANT
SPONGE GAUZE 4X4 12PLY (GAUZE/BANDAGES/DRESSINGS) ×2 IMPLANT
STAPLER VISISTAT 35W (STAPLE) ×2 IMPLANT
SUCTION FRAZIER TIP 10 FR DISP (SUCTIONS) IMPLANT
SUT VIC AB 0 CTX 36 (SUTURE) ×1
SUT VIC AB 0 CTX36XBRD ANTBCTR (SUTURE) ×1 IMPLANT
SUT VIC AB 1 CTX 36 (SUTURE) ×1
SUT VIC AB 1 CTX36XBRD ANBCTR (SUTURE) ×1 IMPLANT
SUT VIC AB 2-0 CT1 27 (SUTURE) ×1
SUT VIC AB 2-0 CT1 TAPERPNT 27 (SUTURE) ×1 IMPLANT
TAPE HY-TAPE 1X5Y PINK NS LF (GAUZE/BANDAGES/DRESSINGS) ×2 IMPLANT
TOWEL OR 17X24 6PK STRL BLUE (TOWEL DISPOSABLE) ×2 IMPLANT
TOWEL OR 17X26 10 PK STRL BLUE (TOWEL DISPOSABLE) ×2 IMPLANT
TRAY FOLEY CATH 14FR (SET/KITS/TRAYS/PACK) ×2 IMPLANT
WATER STERILE IRR 1000ML POUR (IV SOLUTION) ×4 IMPLANT

## 2013-04-13 NOTE — Anesthesia Postprocedure Evaluation (Signed)
Anesthesia Post Note  Patient: Holly Herring  Procedure(s) Performed: Procedure(s) (LRB): TOTAL KNEE ARTHROPLASTY (Left)  Anesthesia type: general  Patient location: PACU  Post pain: Pain level controlled  Post assessment: Patient's Cardiovascular Status Stable  Last Vitals:  Filed Vitals:   04/13/13 1248  BP: 138/59  Pulse: 109  Temp: 36.4 C  Resp: 14    Post vital signs: Reviewed and stable  Level of consciousness: sedated  Complications: No apparent anesthesia complications

## 2013-04-13 NOTE — Progress Notes (Signed)
Physical Therapy Progress Note  04/13/13 2037  PT Visit Information  Last PT Received On 04/13/13  Assistance Needed +1  PT Time Calculation  PT Start Time 1944  PT Stop Time 1955  PT Time Calculation (min) 11 min  Subjective Data  Subjective "I need to get back to bed.  My knee is cramping"  Precautions  Precautions Knee  Restrictions  Weight Bearing Restrictions Yes  LLE Weight Bearing WBAT  Cognition  Arousal/Alertness Awake/alert  Behavior During Therapy WFL for tasks assessed/performed  Overall Cognitive Status Within Functional Limits for tasks assessed  Bed Mobility  Bed Mobility Sit to Supine  Sit to Supine 4: Min assist;HOB flat  Details for Bed Mobility Assistance Verbal cues for technique.  Assist to bring LLE onto bed.  Transfers  Transfers Sit to Stand;Stand to Sit  Sit to Stand 4: Min assist;With upper extremity assist;With armrests;From chair/3-in-1  Stand to Sit 4: Min guard;With upper extremity assist;To bed  Details for Transfer Assistance Verbal cues for placement of hands and of LLE.  Assist to rise from chair.  Ambulation/Gait  Ambulation/Gait Assistance 4: Min assist  Ambulation Distance (Feet) 6 Feet  Assistive device Rolling walker  Ambulation/Gait Assistance Details Verbal cues for safe use of RW and for gait sequence.  Good use of UE's on walker.  Gait Pattern Step-to pattern;Decreased stance time - left;Decreased step length - right;Antalgic  PT - End of Session  Equipment Utilized During Treatment Gait belt  Activity Tolerance Patient limited by pain;Patient tolerated treatment well  Patient left in bed;with call bell/phone within reach;with family/visitor present  Nurse Communication Mobility status;Patient requests pain meds  PT - Assessment/Plan  Comments on Treatment Session Patient motivated to attempt ambulation today.  Was able to ambulate 6' with RW.  Pain limiting mobility.  PT Plan Discharge plan remains appropriate;Frequency remains  appropriate  PT Frequency 7X/week  Follow Up Recommendations Home health PT;Supervision/Assistance - 24 hour  PT equipment None recommended by PT  Acute Rehab PT Goals  Pt will go Sit to Supine/Side with supervision;with HOB 0 degrees  PT Goal: Sit to Supine/Side - Progress Progressing toward goal  Pt will go Sit to Stand with supervision;with upper extremity assist  PT Goal: Sit to Stand - Progress Progressing toward goal  Pt will Ambulate >150 feet;with supervision;with rolling walker  PT Goal: Ambulate - Progress Progressing toward goal  PT General Charges  $$ ACUTE PT VISIT 1 Procedure  PT Treatments  $Gait Training 8-22 mins  Durenda Hurt. Renaldo Fiddler, [facility] Acute Rehab Services Pager [phone]

## 2013-04-13 NOTE — OR Nursing (Signed)
Patient placed on PACU hold at 12:29.  Oralia Manis, RN

## 2013-04-13 NOTE — Anesthesia Preprocedure Evaluation (Addendum)
Anesthesia Evaluation  Patient identified by MRN, date of birth, ID band Patient awake    Reviewed: Allergy & Precautions, H&P , NPO status , Patient's Chart, lab work & pertinent test results  Airway Mallampati: II TM Distance: >3 FB Neck ROM: Full    Dental  (+) Teeth Intact and Dental Advisory Given   Pulmonary          Cardiovascular hypertension, Pt. on medications     Neuro/Psych    GI/Hepatic   Endo/Other    Renal/GU      Musculoskeletal   Abdominal   Peds  Hematology   Anesthesia Other Findings   Reproductive/Obstetrics                           Anesthesia Physical Anesthesia Plan  ASA: III  Anesthesia Plan: General   Post-op Pain Management:    Induction: Intravenous  Airway Management Planned: LMA  Additional Equipment:   Intra-op Plan:   Post-operative Plan: Extubation in OR  Informed Consent: I have reviewed the patients History and Physical, chart, labs and discussed the procedure including the risks, benefits and alternatives for the proposed anesthesia with the patient or authorized representative who has indicated his/her understanding and acceptance.   Dental advisory given  Plan Discussed with: CRNA and Surgeon  Anesthesia Plan Comments:       Anesthesia Quick Evaluation

## 2013-04-13 NOTE — Evaluation (Signed)
Physical Therapy Evaluation Patient Details Name: Holly Herring MRN: [id number] DOB: May 23, 1951 Today's Date: 04/13/2013 Time: 2841-3244 PT Time Calculation (min): 32 min  PT Assessment / Plan / Recommendation Clinical Impression  Patient is a 62 yo female s/p Lt. TKA.  Will benefit from acute PT to maximize independence prior to return home.  Recommend HHPT.    PT Assessment  Patient needs continued PT services    Follow Up Recommendations  Home health PT;Supervision/Assistance - 24 hour    Does the patient have the potential to tolerate intense rehabilitation      Barriers to Discharge None      Equipment Recommendations  None recommended by PT    Recommendations for Other Services     Frequency 7X/week    Precautions / Restrictions Precautions Precautions: Knee Precaution Booklet Issued: Yes (comment) Precaution Comments: Provided education on precautions. Restrictions Weight Bearing Restrictions: Yes LLE Weight Bearing: Weight bearing as tolerated   Pertinent Vitals/Pain       Mobility  Bed Mobility Bed Mobility: Supine to Sit;Sitting - Scoot to Edge of Bed Supine to Sit: 4: Min assist;HOB flat Sitting - Scoot to [place] of Bed: 4: Min guard Details for Bed Mobility Assistance: Verbal cues for technique.  Assist to move LLE off of bed.  Patient sat EOB x 6 minutes with good balance. Transfers Transfers: Sit to Stand;Stand to [employer] Transfers Sit to Stand: 4: Min assist;With upper extremity assist;From bed Stand to Sit: 4: Min assist;With upper extremity assist;With armrests;To chair/3-in-1 Stand Pivot Transfers: 4: Min assist Details for Transfer Assistance: Verbal cues for hand placement and technique.  Assist to rise to standing.  Patient able to take several steps to pivot to chair. Ambulation/Gait Ambulation/Gait Assistance: Not tested (comment)    Exercises Total Joint Exercises Ankle Circles/Pumps: AROM;Both;10 reps;Seated   PT Diagnosis:  Difficulty walking;Abnormality of gait;Acute pain  PT Problem List: Decreased strength;Decreased range of motion;Decreased activity tolerance;Decreased balance;Decreased mobility;Decreased knowledge of use of DME;Decreased knowledge of precautions;Pain PT Treatment Interventions: DME instruction;Gait training;Stair training;Functional mobility training;Therapeutic exercise;Patient/family education   PT Goals Acute Rehab PT Goals PT Goal Formulation: With patient Time For Goal Achievement: 04/20/13 Potential to Achieve Goals: Good Pt will go Supine/Side to Sit: with supervision;with HOB 0 degrees PT Goal: Supine/Side to Sit - Progress: Goal set today Pt will go Sit to Supine/Side: with supervision;with HOB 0 degrees PT Goal: Sit to Supine/Side - Progress: Goal set today Pt will go Sit to Stand: with supervision;with upper extremity assist PT Goal: Sit to Stand - Progress: Goal set today Pt will Ambulate: >150 feet;with supervision;with rolling walker PT Goal: Ambulate - Progress: Goal set today Pt will Go Up / Down Stairs: 6-9 stairs;with min assist;with rail(s);with least restrictive assistive device PT Goal: Up/Down Stairs - Progress: Goal set today Pt will Perform Home Exercise Program: with supervision, verbal cues required/provided PT Goal: Perform Home Exercise Program - Progress: Goal set today  Visit Information  Last PT Received On: 04/13/13 Assistance Needed: +1    Subjective Data  Subjective: "I'm feeling pretty good" Patient Stated Goal: To go home soon.   Prior Functioning  Home Living Lives With: Alone Available Help at Discharge: Family;Friend(s);Available 24 hours/day Type of Home: House Home Access: Stairs to enter [employer] of Steps: 1 Entrance Stairs-Rails: None Home Layout: Two level;1/2 bath on main level;Bed/bath upstairs Alternate Level Stairs-Number of Steps: 13 Alternate Level Stairs-Rails: Right;Left Bathroom Shower/Tub: Tub/shower  unit;Curtain Bathroom Toilet: Standard Bathroom Accessibility: Yes How Accessible: Accessible  via walker Home Adaptive Equipment: Walker - rolling;Straight cane;Raised toilet seat with rails Prior Function Level of Independence: Independent with assistive device(s) Able to Take Stairs?: Yes Driving: Yes Vocation: Unemployed Communication Communication: No difficulties    Cognition  Cognition Arousal/Alertness: Awake/alert Behavior During Therapy: WFL for tasks assessed/performed Overall Cognitive Status: Within Functional Limits for tasks assessed    Extremity/Trunk Assessment Right Upper Extremity Assessment RUE ROM/Strength/Tone: Within functional levels RUE Sensation: WFL - Light Touch Left Upper Extremity Assessment LUE ROM/Strength/Tone: Within functional levels LUE Sensation: WFL - Light Touch Right Lower Extremity Assessment RLE ROM/Strength/Tone: Within functional levels RLE Sensation: WFL - Light Touch Left Lower Extremity Assessment LLE ROM/Strength/Tone: Deficits;Unable to fully assess;Due to pain LLE ROM/Strength/Tone Deficits: Able to lift LLE off of bed Trunk Assessment Trunk Assessment: Normal   Balance Balance Balance Assessed: Yes Static Sitting Balance Static Sitting - Balance Support: No upper extremity supported;Feet supported Static Sitting - Level of Assistance: 5: Stand by assistance Static Sitting - Comment/# of Minutes: 6  End of Session PT - End of Session Equipment Utilized During Treatment: Gait belt Activity Tolerance: Patient tolerated treatment well Patient left: in chair;with call bell/phone within reach;with family/visitor present Nurse Communication: Mobility status (Off CPM at 19:00) CPM Left Knee CPM Left Knee: Off  GP     Vena Austria 04/13/2013, 8:34 PM Durenda Hurt. Renaldo Fiddler, [facility] Acute Rehab Services Pager [phone]

## 2013-04-13 NOTE — Anesthesia Procedure Notes (Signed)
Procedure Name: LMA Insertion Date/Time: 04/13/2013 9:49 AM Performed by: Luster Landsberg Pre-anesthesia Checklist: Patient identified, Emergency Drugs available, Suction available and Patient being monitored Patient Re-evaluated:Patient Re-evaluated prior to inductionOxygen Delivery Method: Circle system utilized Preoxygenation: Pre-oxygenation with 100% oxygen Intubation Type: IV induction LMA: LMA inserted LMA Size: 5.0 Number of attempts: 1 Placement Confirmation: positive ETCO2 and breath sounds checked- equal and bilateral Tube secured with: Tape Dental Injury: Teeth and Oropharynx as per pre-operative assessment

## 2013-04-13 NOTE — Plan of Care (Signed)
Problem: Consults Goal: Diagnosis- Total Joint Replacement Primary Total Knee LEFT     

## 2013-04-13 NOTE — Transfer of Care (Signed)
Immediate Anesthesia Transfer of Care Note  Patient: Holly Herring  Procedure(s) Performed: Procedure(s) with comments: TOTAL KNEE ARTHROPLASTY (Left) - DEPUY/SIGMA  Patient Location: PACU  Anesthesia Type:General  Level of Consciousness: awake and alert   Airway & Oxygen Therapy: Patient Spontanous Breathing and Patient connected to nasal cannula oxygen  Post-op Assessment: Report given to PACU RN, Post -op Vital signs reviewed and stable and Patient moving all extremities  Post vital signs: Reviewed and stable  Complications: No apparent anesthesia complications

## 2013-04-13 NOTE — Interval H&P Note (Signed)
History and Physical Interval Note:  04/13/2013 9:00 AM  [name]  has presented today for surgery, with the diagnosis of LEFT KNEE OSTEOARTHRITIS  The various methods of treatment have been discussed with the patient and family. After consideration of risks, benefits and other options for treatment, the patient has consented to  Procedure(s) with comments: TOTAL KNEE ARTHROPLASTY (Left) - DEPUY/SIGMA as a surgical intervention .  The patient's history has been reviewed, patient examined, no change in status, stable for surgery.  I have reviewed the patient's chart and labs.  Questions were answered to the patient's satisfaction.     Holly Herring

## 2013-04-13 NOTE — Progress Notes (Signed)
Orthopedic Tech Progress Note Patient Details:  Holly Herring [birth year] [id number] CPM applied to Left LE with appropriate settings. NO OHF available at this time.  CPM Left Knee CPM Left Knee: On Left Knee Flexion (Degrees): 60 Left Knee Extension (Degrees): 0   Asia R Thompson 04/13/2013, 1:15 PM

## 2013-04-13 NOTE — Op Note (Signed)
PATIENT ID:      Holly Herring  MRN:     [id number] DOB/AGE:    [birth year] / 62 y.o.       OPERATIVE REPORT    DATE OF PROCEDURE:  04/13/2013       PREOPERATIVE DIAGNOSIS:   LEFT KNEE OSTEOARTHRITIS      Estimated body mass index is 38.38 kg/(m^2) as calculated from the following:   Height as of 04/06/13: 5\' 1"  (1.549 m).   Weight as of 02/27/13: 92.08 kg (203 lb).                                                        POSTOPERATIVE DIAGNOSIS:   LEFT KNEE OSTEOARTHRITIS                                                                      PROCEDURE:  Procedure(s): TOTAL KNEE ARTHROPLASTY Using Depuy Sigma RP implants #3L Femur, #3Tibia, 10mm sigma RP bearing, 35 Patella     SURGEON: Lissandro Dilorenzo J    ASSISTANT:   Shirl Harris PA-C   (Present and scrubbed throughout the case, critical for assistance with exposure, retraction, instrumentation, and closure.)         ANESTHESIA: GET and exparel  DRAINS: foley, 2 medium hemovac in knee   TOURNIQUET TIME:   COMPLICATIONS:  None     SPECIMENS: None   INDICATIONS FOR PROCEDURE: The patient has  LEFT KNEE OSTEOARTHRITIS, varus deformities, XR shows bone on bone arthritis. 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 With that in mind will be using Tranxene make acid preoperatively to minimize blood loss her hemoglobin at the start of the procedure was 12.9. Patient has failed all conservative measures including anti-inflammatory medicines, narcotics, attempts at  exercise and weight loss, cortisone injections and viscosupplementation.  Risks and benefits of surgery have been discussed, questions answered.   DESCRIPTION OF PROCEDURE: The patient identified by armband, received  IV antibiotics, in the holding area at [facility]. Patient taken to the operating room, appropriate anesthetic  monitors were attached, and general endotracheal anesthesia induced with  the patient in supine position,  Foley catheter was inserted. Tourniquet  applied high to the operative thigh. Lateral post and foot positioner  applied to the table, the lower extremity was then prepped and draped  in usual sterile fashion from the ankle to the tourniquet. Time-out procedure was performed. The limb was wrapped with an Esmarch bandage and the tourniquet inflated to 350 mmHg. We began the operation by making the anterior midline incision starting at handbreadth above the patella going over the patella 1 cm medial to and  4 cm distal to the tibial tubercle. Small bleeders in the skin and the  subcutaneous tissue identified and cauterized. Transverse retinaculum was incised and reflected medially and a medial parapatellar arthrotomy was accomplished. the patella was everted and theprepatellar fat pad resected. The superficial medial collateral  ligament was then elevated from anterior to posterior along the proximal  flare of the tibia and anterior half  of the menisci resected. The knee was hyperflexed exposing bone on bone arthritis. Peripheral and notch osteophytes as well as the cruciate ligaments were then resected. We continued to  work our way around posteriorly along the proximal tibia, and externally  rotated the tibia subluxing it out from underneath the femur. A McHale  retractor was placed through the notch and a lateral Hohmann retractor  placed, and we then drilled through the proximal tibia in line with the  axis of the tibia followed by an intramedullary guide rod and 2-degree  posterior slope cutting guide. The tibial cutting guide was pinned into place  allowing resection of 2 mm of bone medially and about 9 mm of bone  laterally because of her varus deformity. Satisfied with the tibial resection, we then  entered the distal femur 2 mm anterior to the PCL origin with the  intramedullary guide rod and applied the distal femoral cutting guide  set at 11mm, with 5 degrees of valgus. This was pinned  along the  epicondylar axis. At this point, the distal femoral cut was accomplished without difficulty. We then sized for a #3 femoral component and pinned the guide in 3 degrees of external rotation.The chamfer cutting guide was pinned into place. The anterior, posterior, and chamfer cuts were accomplished without difficulty followed by  the Sigma RP box cutting guide and the box cut. We also removed posterior osteophytes from the posterior femoral condyles. At this  time, the knee was brought into full extension. We checked our  extension and flexion gaps and found them symmetric at 10mm.  The patella thickness measured at 24 mm. We set the cutting guide at 15 and removed the posterior 10 mm  of the patella, sized for a 35 button and drilled the lollipop. The knee  was then once again hyperflexed exposing the proximal tibia. We sized for a #3 tibial base plate, applied the smokestack and the conical reamer followed by the the Delta fin keel punch. We then hammered into place the Sigma RP trial femoral component, inserted a 10-mm trial bearing, trial patellar button, and took the knee through range of motion from 0-130 degrees. No thumb pressure was required for patellar  tracking. At this point, all trial components were removed, a double batch of DePuy HV cement with 1500 mg of Zinacef was mixed and applied to all bony metallic mating surfaces except for the posterior condyles of the femur itself. In order, we  hammered into place the tibial tray and removed excess cement, the femoral component and removed excess cement, a 10-mm Sigma RP bearing  was inserted, and the knee brought to full extension with compression.  The patellar button was clamped into place, and excess cement  removed. While the cement cured the wound was irrigated out with normal saline solution pulse lavage, and medium Hemovac drains were placed from an anterolateral  approach. Ligament stability and patellar tracking were  checked and found to be excellent. The parapatellar arthrotomy was closed with  running #1 Vicryl suture. The subcutaneous tissue with 0 and 2-0 undyed  Vicryl suture, and the skin with skin staples. A dressing of Xeroform,  4 x 4, dressing sponges, Webril, and Ace wrap applied. The patient  awakened, extubated, and taken to recovery room without difficulty.   Nestor Lewandowsky 04/13/2013, 12:23 PM

## 2013-04-14 ENCOUNTER — Encounter (HOSPITAL_COMMUNITY): Payer: Self-pay | Admitting: Orthopedic Surgery

## 2013-04-14 LAB — CBC
Hemoglobin: 10.1 g/dL — ABNORMAL LOW (ref 12.0–15.0)
MCH: 27.4 pg (ref 26.0–34.0)
MCHC: 33 g/dL (ref 30.0–36.0)
RDW: 14.5 % (ref 11.5–15.5)

## 2013-04-14 LAB — BASIC METABOLIC PANEL
Calcium: 8.8 mg/dL (ref 8.4–10.5)
GFR calc Af Amer: >90 mL/min (ref >90)
GFR calc non Af Amer: >90 mL/min (ref >90)
Glucose, Bld: 140 mg/dL — ABNORMAL HIGH (ref 70–99)
Potassium: 3.9 mEq/L (ref 3.5–5.1)
Sodium: 138 mEq/L (ref 135–145)

## 2013-04-14 NOTE — Progress Notes (Signed)
Physical Therapy Treatment Patient Details Name: Holly Herring MRN: [id number] DOB: 20-Dec-1950 Today's Date: 04/14/2013 Time: 1105-1130 PT Time Calculation (min): 25 min  PT Assessment / Plan / Recommendation Comments on Treatment Session  Patient continues to make progress and able to tolerate stair training. Anticipate DC in AM    Follow Up Recommendations  Home health PT;Supervision/Assistance - 24 hour     Does the patient have the potential to tolerate intense rehabilitation     Barriers to Discharge        Equipment Recommendations  None recommended by PT    Recommendations for Other Services    Frequency 7X/week   Plan Discharge plan remains appropriate;Frequency remains appropriate    Precautions / Restrictions Precautions Precautions: Knee Restrictions Weight Bearing Restrictions: Yes LLE Weight Bearing: Weight bearing as tolerated   Pertinent Vitals/Pain     Mobility  Bed Mobility Supine to Sit: 5: Supervision Sitting - Scoot to Edge of Bed: 5: Supervision Transfers Sit to Stand: With upper extremity assist;From bed;From toilet;5: Supervision Stand to Sit: With upper extremity assist;To toilet;To chair/3-in-1;5: Supervision Details for Transfer Assistance: Patient with safe technique. MinGuard for safety Ambulation/Gait Ambulation/Gait Assistance: 5: Supervision Ambulation Distance (Feet): 200 Feet Assistive device: Rolling walker Ambulation/Gait Assistance Details: Cues to L heel strike Gait Pattern: Step-to pattern Stairs: Yes Stairs Assistance: 4: Min guard Stairs Assistance Details (indicate cue type and reason): Cues for sequency Stair Management Technique: Two rails;Forwards Number of Stairs: 5    Exercises Total Joint Exercises Ankle Circles/Pumps: AROM;Both;10 reps;Seated Quad Sets: AROM;Left;10 reps Heel Slides: AAROM;Left;15 reps Hip ABduction/ADduction: AROM;Left;15 reps Straight Leg Raises: AROM;Left;15 reps Long Arc Quad:  AROM;Left;15 reps Goniometric ROM: 45 degrees active flexion   PT Diagnosis:    PT Problem List:   PT Treatment Interventions:     PT Goals Acute Rehab PT Goals PT Goal: Supine/Side to Sit - Progress: Met PT Goal: Sit to Stand - Progress: Met PT Goal: Ambulate - Progress: Met PT Goal: Up/Down Stairs - Progress: Progressing toward goal PT Goal: Perform Home Exercise Program - Progress: Progressing toward goal  Visit Information  Last PT Received On: 04/14/13 Assistance Needed: +1    Subjective Data      Cognition  Cognition Arousal/Alertness: Awake/alert Behavior During Therapy: WFL for tasks assessed/performed Overall Cognitive Status: Within Functional Limits for tasks assessed    Balance     End of Session PT - End of Session Equipment Utilized During Treatment: Gait belt Activity Tolerance: Patient tolerated treatment well Patient left: in chair;with call bell/phone within reach Nurse Communication: Mobility status CPM Left Knee CPM Left Knee: Off Left Knee Flexion (Degrees): 65 Left Knee Extension (Degrees): 0   GP     Fredrich Birks 04/14/2013, 11:53 AM  04/14/2013 Fredrich Birks PTA [phone] pager [phone] office

## 2013-04-14 NOTE — Progress Notes (Signed)
Patient ID: Holly Herring, female   DOB: [birth year], 62 y.o.   MRN: [id number] PATIENT ID: Holly Herring  MRN: [id number]  DOB/AGE:  1951/06/29 / 62 y.o.  1 Day Post-Op Procedure(s) (LRB): TOTAL KNEE ARTHROPLASTY (Left)    PROGRESS NOTE Subjective: Patient is alert, oriented, no Nausea, no Vomiting, yes passing gas, no Bowel Movement. Taking PO well. Denies SOB, Chest or Calf Pain. Using Incentive Spirometer, PAS in place. Ambulate patient walked in room yesterday without difficulty, CPM 0-50 Patient reports pain as 4 on 0-10 scale  .    Objective: Vital signs in last 24 hours: Filed Vitals:   04/13/13 2013 04/14/13 0007 04/14/13 0400 04/14/13 0416  BP: 119/57 140/60 139/64   Pulse: 94 86 91   Temp:  98.4 F (36.9 C) 98.3 F (36.8 C)   TempSrc:      Resp: 16 16 16    Height:    5\' 2"  (1.575 m)  Weight:    93.895 kg (207 lb)  SpO2: 100% 96% 96%       Intake/Output from previous day: I/O last 3 completed shifts: In: 2240 [P.O.:240; I.V.:2000] Out: 2575 [Urine:2300; Drains:275]   Intake/Output this shift:     LABORATORY DATA:  Recent Labs  04/14/13 0400  WBC 9.1  HGB 10.1*  HCT 30.6*  PLT 276  NA 138  K 3.9  CL 105  CO2 27  BUN 9  CREATININE 0.67  GLUCOSE 140*  CALCIUM 8.8    Examination: Neurologically intact ABD soft Neurovascular intact Sensation intact distally Intact pulses distally Dorsiflexion/Plantar flexion intact Incision: scant drainage No cellulitis present Compartment soft} Blood and plasma separated in drain indicating minimal recent drainage, drain pulled without difficulty.   Assessment:   1 Day Post-Op Procedure(s) (LRB): TOTAL KNEE ARTHROPLASTY (Left) ADDITIONAL DIAGNOSIS:  Hypertension  Plan: PT/OT WBAT, CPM 5/hrs day until ROM 0-90 degrees, then D/C CPM DVT Prophylaxis:  SCDx72hrs, ASA 325 mg BID x 2 weeks DISCHARGE PLAN: Home, patient should pass physical therapy by tomorrow. She does have help at home with  family. DISCHARGE NEEDS: HHPT, HHRN, CPM, Walker and 3-in-1 comode seat     Meital Riehl J 04/14/2013, 7:03 AM

## 2013-04-14 NOTE — Progress Notes (Signed)
OT Cancellation Note  Patient Details Name: Holly Herring MRN: [id number] DOB: [birth year]   Cancelled Treatment:    Reason Eval/Treat Not Completed: Other (comment) (screen) Order received, reviewed chart, and spoke with pt. Pt kindly explained she has no OT concerns. Will sign off.     Earlie Raveling OTR/L [phone] 04/14/2013, 2:48 PM

## 2013-04-14 NOTE — Progress Notes (Signed)
UR COMPLETED  

## 2013-04-14 NOTE — Progress Notes (Signed)
Physical Therapy Treatment Patient Details Name: Holly Herring MRN: [id number] DOB: [birth year] Today's Date: 04/14/2013 Time: 1324-4010 PT Time Calculation (min): 23 min  PT Assessment / Plan / Recommendation Comments on Treatment Session  Patient motivated and progressing well with mobility. Will increase ambulation and therex next session    Follow Up Recommendations  Home health PT;Supervision/Assistance - 24 hour     Does the patient have the potential to tolerate intense rehabilitation     Barriers to Discharge        Equipment Recommendations  None recommended by PT    Recommendations for Other Services    Frequency 7X/week   Plan Discharge plan remains appropriate;Frequency remains appropriate    Precautions / Restrictions Precautions Precautions: Knee Restrictions Weight Bearing Restrictions: Yes LLE Weight Bearing: Weight bearing as tolerated   Pertinent Vitals/Pain 3/10 knee pain. Patient premedicated    Mobility  Bed Mobility Supine to Sit: 5: Supervision Sitting - Scoot to Edge of Bed: 5: Supervision Transfers Sit to Stand: 4: Min guard;With upper extremity assist;From bed;From toilet Stand to Sit: 4: Min guard;With upper extremity assist;To toilet;To chair/3-in-1 Details for Transfer Assistance: Patient with safe technique. MinGuard for safety Ambulation/Gait Ambulation/Gait Assistance: 4: Min guard Ambulation Distance (Feet): 80 Feet Assistive device: Rolling walker Ambulation/Gait Assistance Details: Cues for gait sequence Gait Pattern: Step-to pattern    Exercises Total Joint Exercises Ankle Circles/Pumps: AROM;Both;10 reps;Seated Quad Sets: AROM;Left;10 reps Heel Slides: AAROM;Left;10 reps Hip ABduction/ADduction: AROM;Left;10 reps Straight Leg Raises: AROM;Left;10 reps Long Arc Quad: AROM;Left;10 reps Goniometric ROM: 45 degrees active flexion   PT Diagnosis:    PT Problem List:   PT Treatment Interventions:     PT Goals Acute Rehab  PT Goals PT Goal: Supine/Side to Sit - Progress: Met PT Goal: Sit to Stand - Progress: Progressing toward goal PT Goal: Ambulate - Progress: Progressing toward goal PT Goal: Perform Home Exercise Program - Progress: Progressing toward goal  Visit Information  Last PT Received On: 04/14/13 Assistance Needed: +1    Subjective Data      Cognition  Cognition Arousal/Alertness: Awake/alert Behavior During Therapy: WFL for tasks assessed/performed Overall Cognitive Status: Within Functional Limits for tasks assessed    Balance     End of Session PT - End of Session Equipment Utilized During Treatment: Gait belt Activity Tolerance: Patient tolerated treatment well Patient left: in chair;with call bell/phone within reach Nurse Communication: Mobility status    GP     Fredrich Birks 04/14/2013, 8:51 AM 04/14/2013 Fredrich Birks PTA [phone] pager [phone] office

## 2013-04-15 LAB — CBC
HCT: 32.8 % — ABNORMAL LOW (ref 36.0–46.0)
Hemoglobin: 11 g/dL — ABNORMAL LOW (ref 12.0–15.0)
MCH: 27.5 pg (ref 26.0–34.0)
MCHC: 33.5 g/dL (ref 30.0–36.0)
MCV: 82 fL (ref 78.0–100.0)
Platelets: 268 10*3/uL (ref 150–400)
RBC: 4 MIL/uL (ref 3.87–5.11)

## 2013-04-15 MED ORDER — HYDROMORPHONE HCL 2 MG PO TABS: 2.0000 mg | ORAL_TABLET | ORAL | Status: DC | PRN

## 2013-04-15 MED ORDER — METHOCARBAMOL 500 MG PO TABS: 500.0000 mg | ORAL_TABLET | Freq: Four times a day (QID) | ORAL | Status: DC | PRN

## 2013-04-15 MED ORDER — ASPIRIN 325 MG PO TBEC: 325.0000 mg | DELAYED_RELEASE_TABLET | Freq: Two times a day (BID) | ORAL | Status: DC

## 2013-04-15 NOTE — Progress Notes (Signed)
Physical Therapy Treatment Patient Details Name: Holly Herring MRN: [id number] DOB: 1951-02-11 Today's Date: 04/15/2013 Time: 4098-1191 PT Time Calculation (min): 24 min  PT Assessment / Plan / Recommendation Comments on Treatment Session  Patient making great progress. PLans to DC today    Follow Up Recommendations  Home health PT;Supervision/Assistance - 24 hour     Does the patient have the potential to tolerate intense rehabilitation     Barriers to Discharge        Equipment Recommendations  None recommended by PT    Recommendations for Other Services    Frequency 7X/week   Plan Discharge plan remains appropriate;Frequency remains appropriate    Precautions / Restrictions Precautions Precautions: Knee Restrictions Weight Bearing Restrictions: Yes LLE Weight Bearing: Weight bearing as tolerated   Pertinent Vitals/Pain     Mobility  Bed Mobility Supine to Sit: 6: Modified independent (Device/Increase time) Sitting - Scoot to Edge of Bed: 6: Modified independent (Device/Increase time) Sit to Supine: 6: Modified independent (Device/Increase time) Transfers Sit to Stand: 6: Modified independent (Device/Increase time) Stand to Sit: 6: Modified independent (Device/Increase time) Ambulation/Gait Ambulation/Gait Assistance: 5: Supervision Ambulation Distance (Feet): 200 Feet Assistive device: Rolling walker Gait Pattern: Step-through pattern;Decreased stride length Stairs: Yes Stairs Assistance: 5: Supervision Stair Management Technique: Two rails;Forwards Number of Stairs: 5    Exercises Total Joint Exercises Ankle Circles/Pumps: AROM;Both;10 reps;Seated Heel Slides: Left;15 reps;AROM Hip ABduction/ADduction: AROM;Left;15 reps Straight Leg Raises: AROM;Left;15 reps Long Arc Quad: AROM;Left;15 reps   PT Diagnosis:    PT Problem List:   PT Treatment Interventions:     PT Goals Acute Rehab PT Goals PT Goal: Supine/Side to Sit - Progress: Met PT Goal: Sit  to Supine/Side - Progress: Met PT Goal: Sit to Stand - Progress: Met PT Goal: Ambulate - Progress: Met PT Goal: Up/Down Stairs - Progress: Met PT Goal: Perform Home Exercise Program - Progress: Met  Visit Information  Last PT Received On: 04/15/13 Assistance Needed: +1    Subjective Data      Cognition  Cognition Arousal/Alertness: Awake/alert Behavior During Therapy: WFL for tasks assessed/performed Overall Cognitive Status: Within Functional Limits for tasks assessed    Balance     End of Session PT - End of Session Equipment Utilized During Treatment: Gait belt Activity Tolerance: Patient tolerated treatment well Patient left: in chair;with call bell/phone within reach Nurse Communication: Mobility status CPM Left Knee CPM Left Knee: Off   GP     Fredrich Birks 04/15/2013, 8:03 AM  04/15/2013 Fredrich Birks PTA [phone] pager [phone] office

## 2013-04-15 NOTE — Progress Notes (Signed)
.   PATIENT ID: Holly Herring  MRN: [id number]  DOB/AGE:  1950/12/13 / 62 y.o.  2 Days Post-Op Procedure(s) (LRB): TOTAL KNEE ARTHROPLASTY (Left)    PROGRESS NOTE Subjective: Patient is alert, oriented, no Nausea, no Vomiting, yes passing gas, no Bowel Movement. Taking PO well. Denies SOB, Chest or Calf Pain. Using Incentive Spirometer, PAS in place. Ambulating well with PT. Patient reports pain as moderate  .    Objective: Vital signs in last 24 hours: Filed Vitals:   04/14/13 2123 04/15/13 0000 04/15/13 0400 04/15/13 0655  BP: 151/54   146/68  Pulse: 90   100  Temp: 98.8 F (37.1 C)   98.6 F (37 C)  TempSrc:      Resp: 18 18 18 18   Height:      Weight:      SpO2: 98% 98% 98% 96%      Intake/Output from previous day: I/O last 3 completed shifts: In: 2340 [P.O.:840; I.V.:1500] Out: 2800 [Urine:2600; Drains:200]   Intake/Output this shift:     LABORATORY DATA:  Recent Labs  04/14/13 0400 04/15/13 0550  WBC 9.1 10.5  HGB 10.1* 11.0*  HCT 30.6* 32.8*  PLT 276 268  NA 138  --   K 3.9  --   CL 105  --   CO2 27  --   BUN 9  --   CREATININE 0.67  --   GLUCOSE 140*  --   CALCIUM 8.8  --     Examination: Neurologically intact ABD soft Neurovascular intact Sensation intact distally Intact pulses distally Dorsiflexion/Plantar flexion intact Incision: dressing C/D/I}  Assessment:   2 Days Post-Op Procedure(s) (LRB): TOTAL KNEE ARTHROPLASTY (Left) ADDITIONAL DIAGNOSIS:  none  Plan: PT/OT WBAT, CPM 5/hrs day until ROM 0-90 degrees, then D/C CPM DVT Prophylaxis:  SCDx72hrs, ASA 325 mg BID x 2 weeks DISCHARGE PLAN: Home today DISCHARGE NEEDS: HHPT, HHRN, CPM, Walker and 3-in-1 comode seat     Holly Herring M. 04/15/2013, 7:53 AM

## 2013-04-15 NOTE — Care Management Note (Signed)
    Page 1 of 1   04/15/2013     11:32:41 AM   CARE MANAGEMENT NOTE 04/15/2013  Patient:  Holly Herring, Holly Herring   Account Number:  [id number]  Date Initiated:  04/15/2013  Documentation initiated by:  Elmer Bales  Subjective/Objective Assessment:   Pt admitted for left total knee.     Action/Plan:   Return home with home health PT/OT eval recommended   Anticipated DC Date:  04/15/2013   Anticipated DC Plan:  HOME W HOME HEALTH SERVICES      DC Planning Services  CM consult      Choice offered to / List presented to:  C-1 Patient        HH arranged  HH-2 PT  HH-3 OT  HH-1 RN      [facility] agency  [facility]   Status of service:  Completed, signed off Medicare Important Message given?   (If response is "NO", the following Medicare IM given date fields will be blank) Date Medicare IM given:   Date Additional Medicare IM given:    Discharge Disposition:  HOME W HOME HEALTH SERVICES  Per UR Regulation:  Reviewed for med. necessity/level of care/duration of stay  If discussed at Long Length of Stay Meetings, dates discussed:    Comments:  Pt declines all ordered DME. States that she already has a rolling walker, shower chair and raised toilet seat.  Pt was given a list of home care agencies and chose [facility].

## 2013-04-15 NOTE — Discharge Summary (Signed)
Patient ID: Holly Herring MRN: [id number] DOB/AGE: 1951-07-16 62 y.o.  Admit date: 04/13/2013 Discharge date: 04/15/2013  Admission Diagnoses:  Principal Problem:   Osteoarthritis of left knee   Discharge Diagnoses:  Same  Past Medical History  Diagnosis Date  . Hypertension   . Arthritis     Surgeries: Procedure(s): TOTAL KNEE ARTHROPLASTY on 04/13/2013   Consultants:    Discharged Condition: Improved  Hospital Course: Holly Herring is an 62 y.o. female who was admitted 04/13/2013 for operative treatment ofOsteoarthritis of left knee. Patient has severe unremitting pain that affects sleep, daily activities, and work/hobbies. After pre-op clearance the patient was taken to the operating room on 04/13/2013 and underwent  Procedure(s): TOTAL KNEE ARTHROPLASTY.    Patient was given perioperative antibiotics: Anti-infectives   Start     Dose/Rate Route Frequency Ordered Stop   04/13/13 1147  vancomycin (VANCOCIN) powder  Status:  Discontinued       As needed 04/13/13 1147 04/13/13 1245   04/13/13 0600  vancomycin (VANCOCIN) IVPB 1000 mg/200 mL premix     1,000 mg 200 mL/hr over 60 Minutes Intravenous On call to O.R. 04/12/13 1318 04/13/13 0937       Patient was given sequential compression devices, early ambulation, and chemoprophylaxis to prevent DVT.  Patient benefited maximally from hospital stay and there were no complications.    Recent vital signs: Patient Vitals for the past 24 hrs:  BP Temp Pulse Resp SpO2  04/15/13 0655 146/68 mmHg 98.6 F (37 C) 100 18 96 %  04/15/13 0400 - - - 18 98 %  04/15/13 0000 - - - 18 98 %  04/14/13 2123 151/54 mmHg 98.8 F (37.1 C) 90 18 98 %  04/14/13 2000 - - - 18 98 %  04/14/13 1500 148/54 mmHg 98.5 F (36.9 C) 95 20 98 %     Recent laboratory studies:  Recent Labs  04/14/13 0400 04/15/13 0550  WBC 9.1 10.5  HGB 10.1* 11.0*  HCT 30.6* 32.8*  PLT 276 268  NA 138  --   K 3.9  --   CL 105  --   CO2 27  --   BUN  9  --   CREATININE 0.67  --   GLUCOSE 140*  --   CALCIUM 8.8  --      Discharge Medications:     Medication List    STOP taking these medications       aspirin 81 MG tablet     naproxen 500 MG tablet  Commonly known as:  NAPROSYN      TAKE these medications       aspirin 325 MG EC tablet  Take 1 tablet (325 mg total) by mouth 2 (two) times daily.     diphenhydrAMINE 25 MG tablet  Commonly known as:  BENADRYL  Take 25 mg by mouth every 6 (six) hours as needed for itching.     estradiol 1 MG tablet  Commonly known as:  ESTRACE  Take 0.5 mg by mouth daily.     Fish Oil 1000 MG Caps  Take 1 capsule by mouth daily.     HYDROmorphone 2 MG tablet  Commonly known as:  DILAUDID  Take 1-2 tablets (2-4 mg total) by mouth every 4 (four) hours as needed.     lisinopril 10 MG tablet  Commonly known as:  PRINIVIL,ZESTRIL  Take 10 mg by mouth daily.     medroxyPROGESTERone 5 MG tablet  Commonly known as:  PROVERA  Take 5 mg by mouth daily.     methocarbamol 500 MG tablet  Commonly known as:  ROBAXIN  Take 1 tablet (500 mg total) by mouth every 6 (six) hours as needed.     Raspberry Ketones 100 MG Caps  Take 1 capsule by mouth daily.     triamterene-hydrochlorothiazide 37.5-25 MG per tablet  Commonly known as:  MAXZIDE-25  Take 1 tablet by mouth daily.     vitamin C 500 MG tablet  Commonly known as:  ASCORBIC ACID  Take 500 mg by mouth daily.        Diagnostic Studies: Dg Chest 2 View  04/06/2013   *RADIOLOGY REPORT*  Clinical Data: Preop for left knee arthroplasty  CHEST - 2 VIEW  Comparison: None  Findings: Cardiomediastinal silhouette is unremarkable.  No acute infiltrate or pleural effusion.  No pulmonary edema.  Mild degenerative changes thoracic spine.  IMPRESSION: No active disease.   Original Report Authenticated By: Natasha Mead, M.D.    Disposition:       Discharge Orders   Future Orders Complete By Expires     Call MD for:  redness, tenderness, or  signs of infection (pain, swelling, redness, odor or green/yellow discharge around incision site)  As directed     Call MD for:  severe uncontrolled pain  As directed     Call MD for:  temperature >100.4  As directed     Change dressing (specify)  As directed     Comments:      Dressing change as needed.    Discharge instructions  As directed     Comments:      F/U with Dr. Turner Daniels as scheduled (14 days post-op)    Driving Restrictions  As directed     Comments:      No driving for 2 weeks.    Increase activity slowly  As directed     May shower / Bathe  As directed     Walker   As directed           Signed: Calista Crain M. 04/15/2013, 7:56 AM

## 2013-04-15 NOTE — Progress Notes (Signed)
Patient provided with discharge instructions and follow up information. She was educated on signs/symptoms of infections and weight bearing status. She will have home health PT provided  By [facility].

## 2013-06-10 ENCOUNTER — Other Ambulatory Visit: Payer: Self-pay | Admitting: *Deleted

## 2013-06-10 MED ORDER — TRIAMTERENE-HCTZ 37.5-25 MG PO TABS: 1.0000 | ORAL_TABLET | Freq: Every day | ORAL | Status: DC

## 2013-06-15 ENCOUNTER — Other Ambulatory Visit: Payer: Self-pay | Admitting: *Deleted

## 2013-06-15 MED ORDER — TRIAMTERENE-HCTZ 37.5-25 MG PO TABS: 1.0000 | ORAL_TABLET | Freq: Every day | ORAL | Status: DC

## 2014-03-16 IMAGING — MG MM DIGITAL SCREENING BILAT
5 series · 5 of 5 positions shown · non-contrast
Comparison: Previous exams.

CLINICAL DATA: Screening.

DIGITAL BILATERAL SCREENING MAMMOGRAM WITH CAD

[R CC]
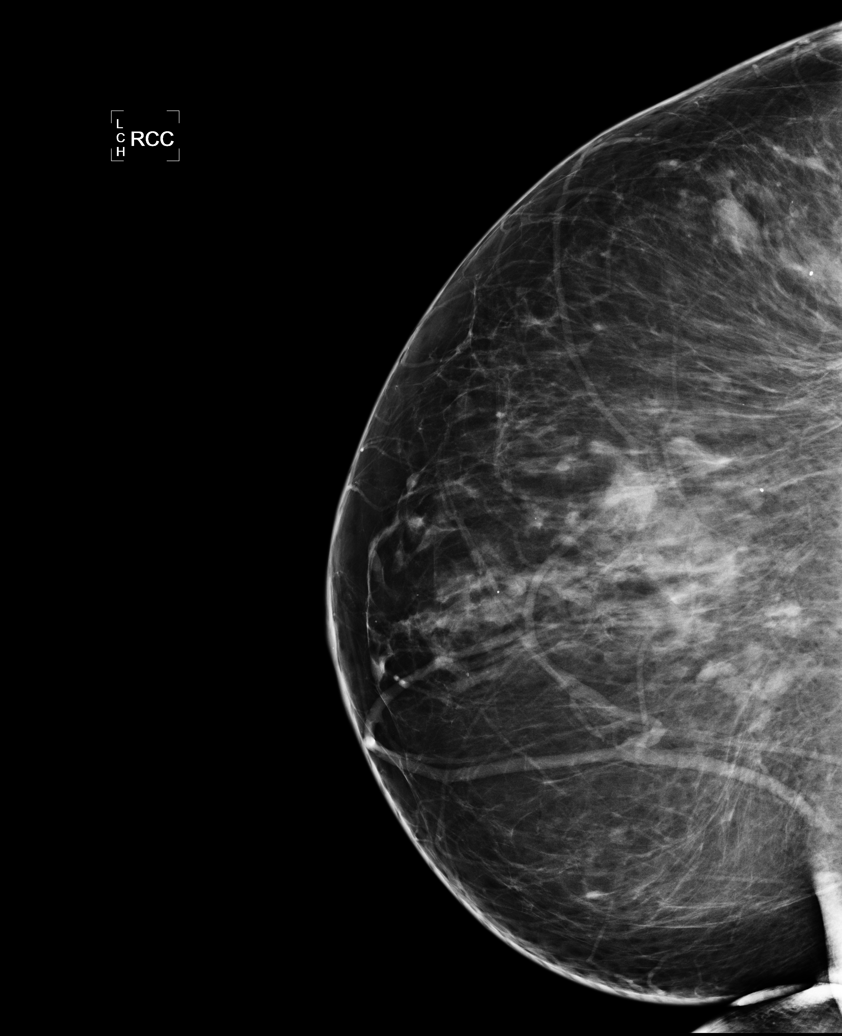

[R MLO]
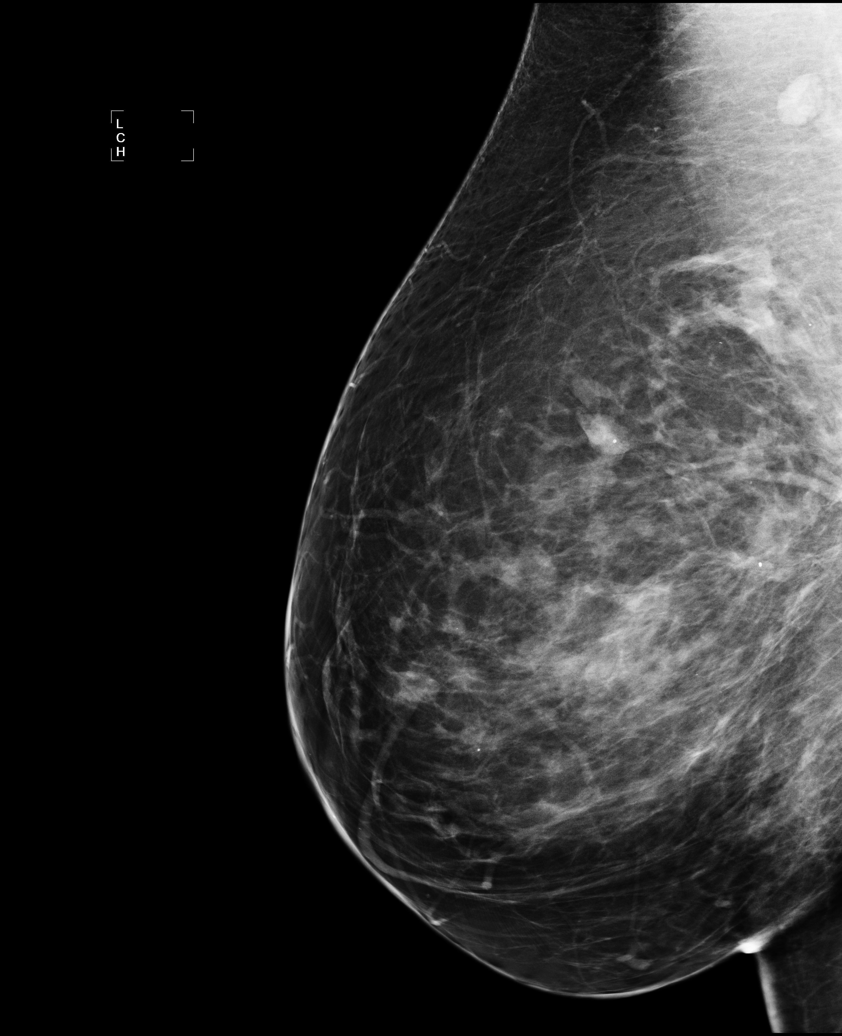

[L CC (1 of 2)]
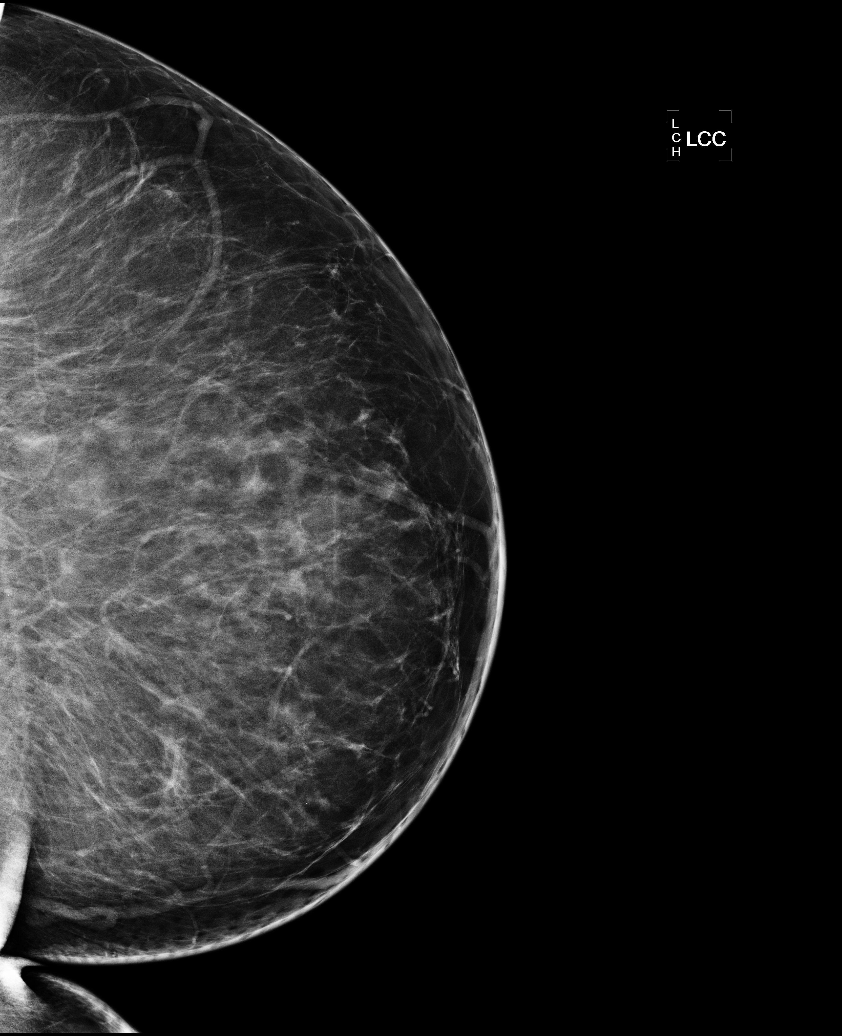

[L MLO]
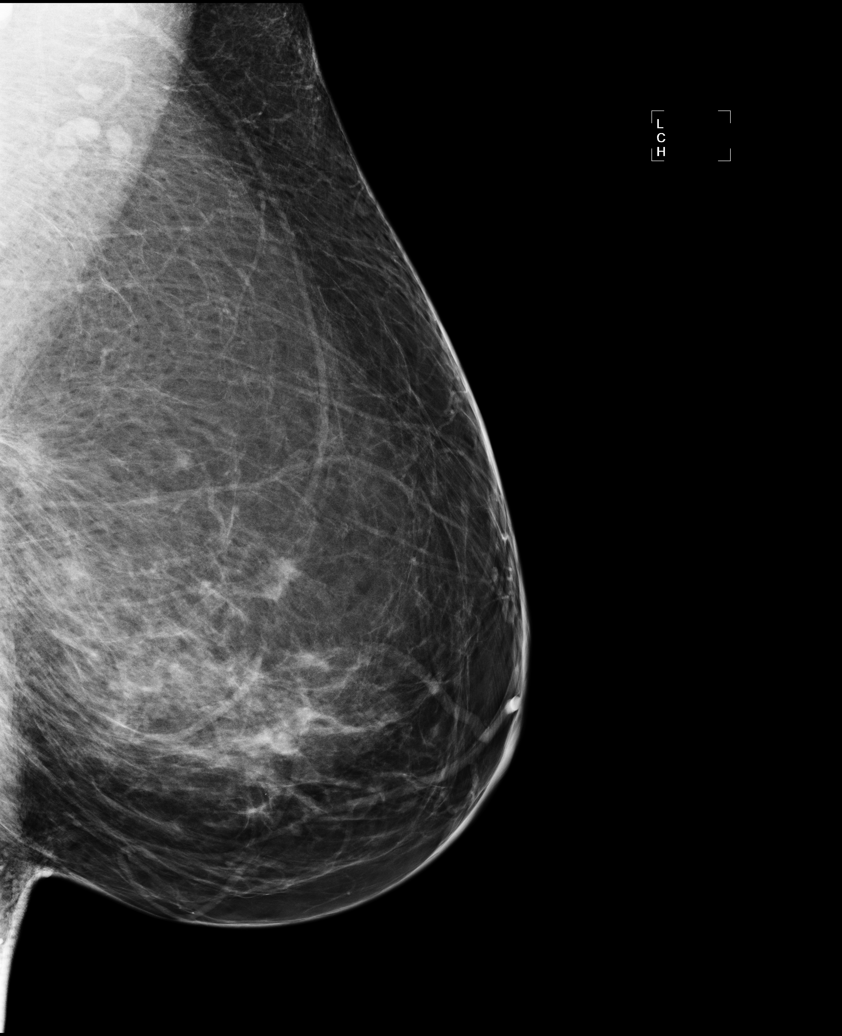

[L CC (2 of 2)]
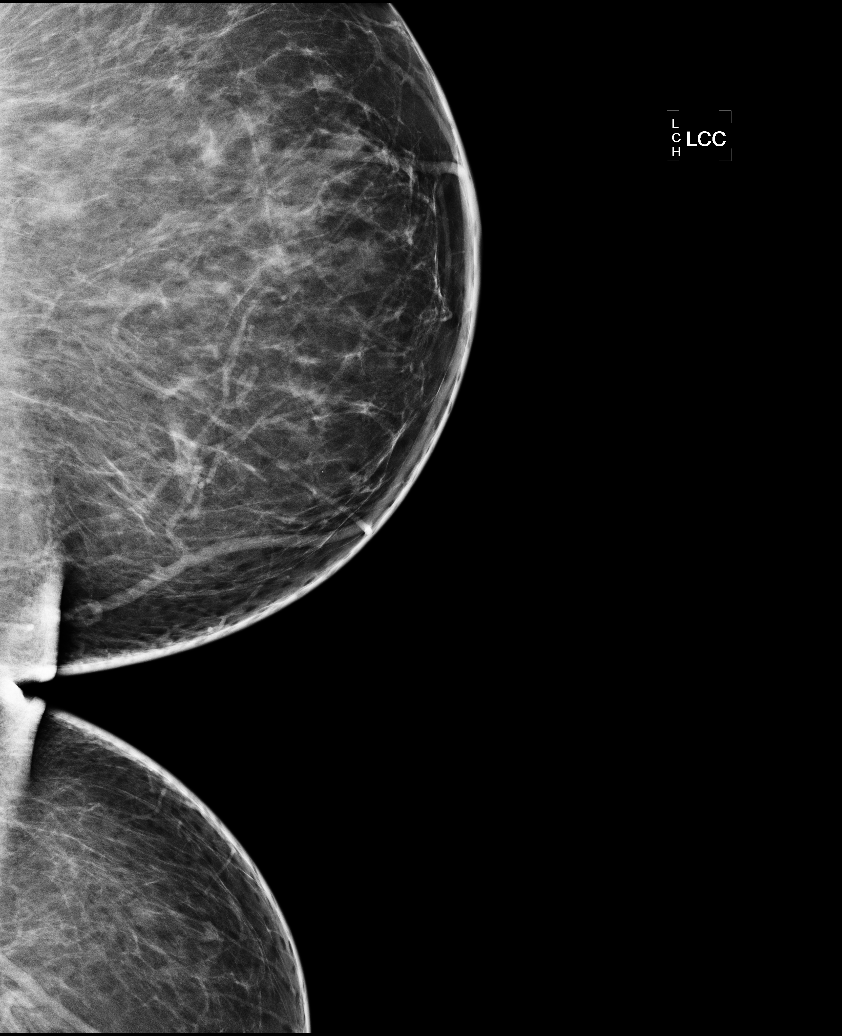

[5 of 5 positions shown; findings below may reference images not displayed]

FINDINGS: ACR Breast Density Category 2: There is a scattered fibroglandular
pattern.

No suspicious masses, architectural distortion, or calcifications
are present.

Images were processed with CAD.
IMPRESSION: No mammographic evidence of malignancy.

A result letter of this screening mammogram will be mailed directly
to the patient.

RECOMMENDATION:
Screening mammogram in one year. (Code:[id number])

BI-RADS CATEGORY 1:  Negative.

## 2014-04-08 ENCOUNTER — Other Ambulatory Visit: Payer: Self-pay | Admitting: *Deleted

## 2014-04-12 MED ORDER — MEDROXYPROGESTERONE ACETATE 5 MG PO TABS: ORAL_TABLET | ORAL | Status: DC

## 2014-04-12 MED ORDER — ESTRADIOL 1 MG PO TABS: 0.5000 mg | ORAL_TABLET | Freq: Every day | ORAL | Status: DC

## 2014-06-17 ENCOUNTER — Other Ambulatory Visit: Payer: Self-pay | Admitting: *Deleted

## 2014-06-17 MED ORDER — TRIAMTERENE-HCTZ 37.5-25 MG PO TABS: 1.0000 | ORAL_TABLET | Freq: Every day | ORAL | Status: DC

## 2014-07-17 IMAGING — CR DG CHEST 2V
2 series · 2 of 2 positions shown · non-contrast
Comparison: None

CLINICAL DATA: Preop for left knee arthroplasty

CHEST - 2 VIEW

[view not recorded (1 of 2)]
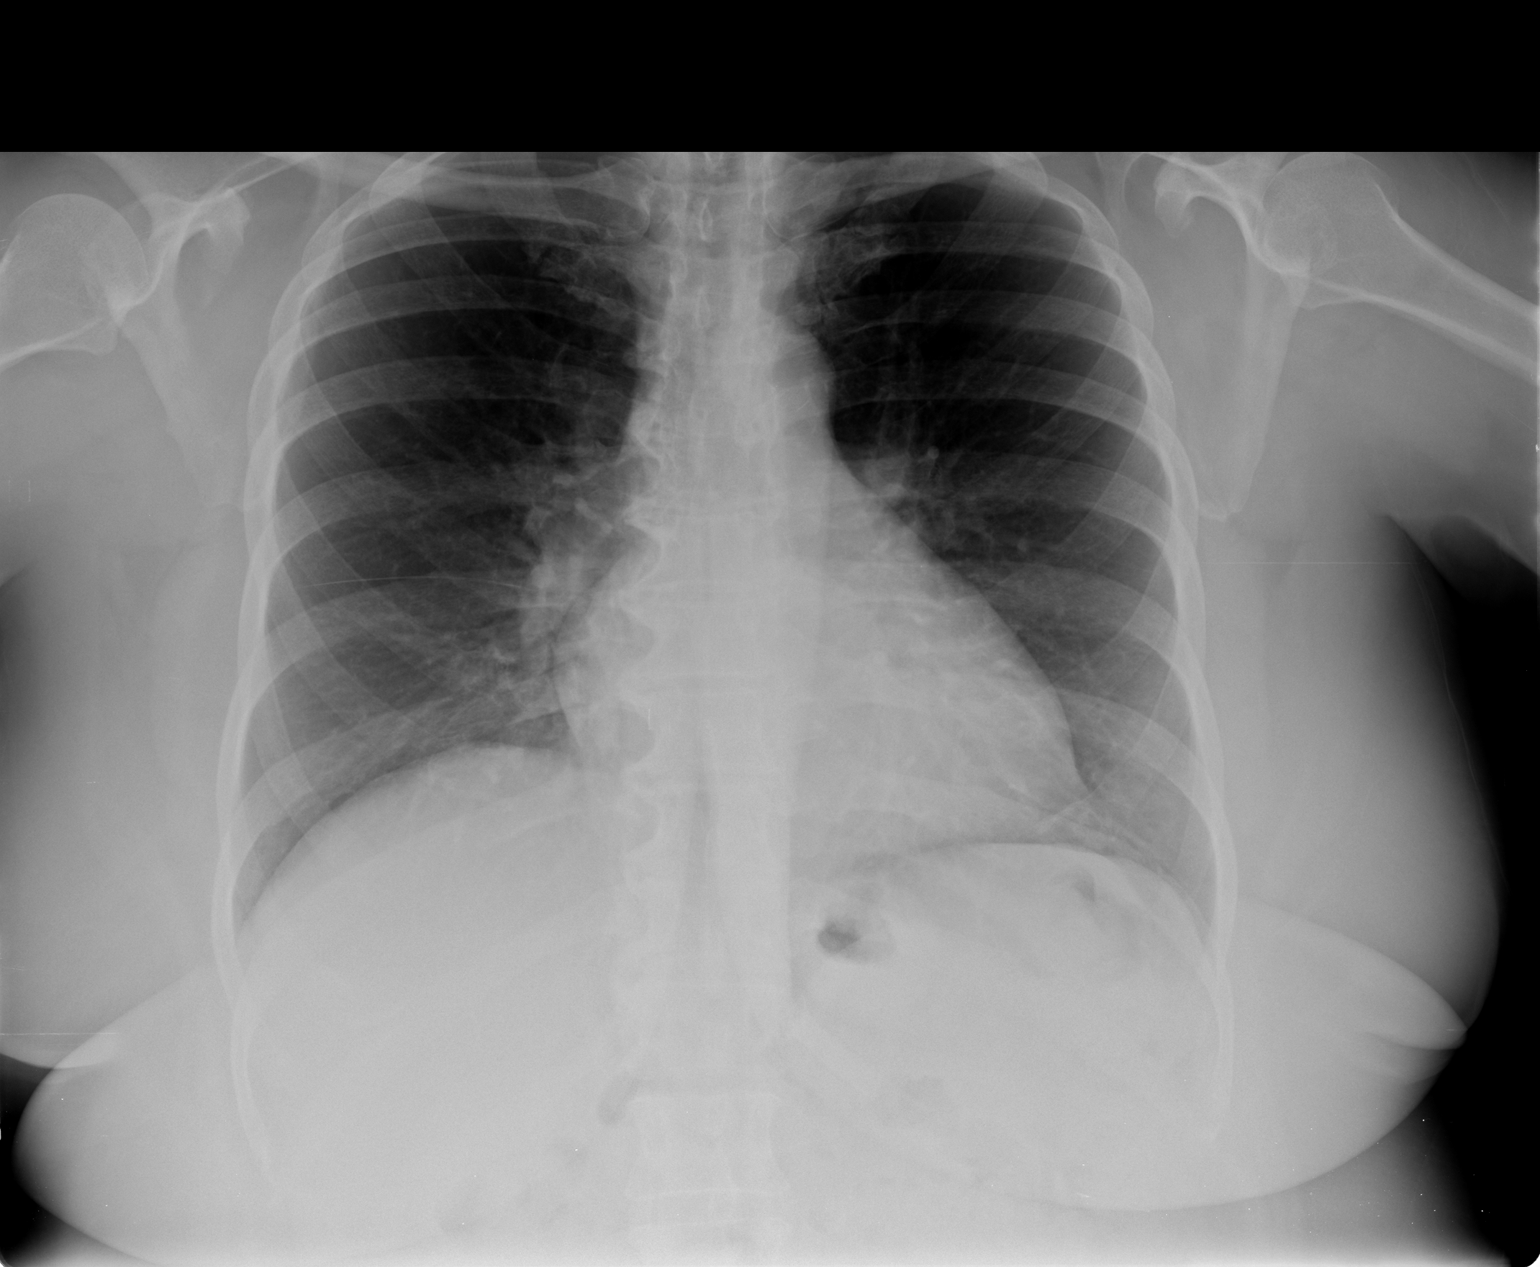

[view not recorded (2 of 2)]
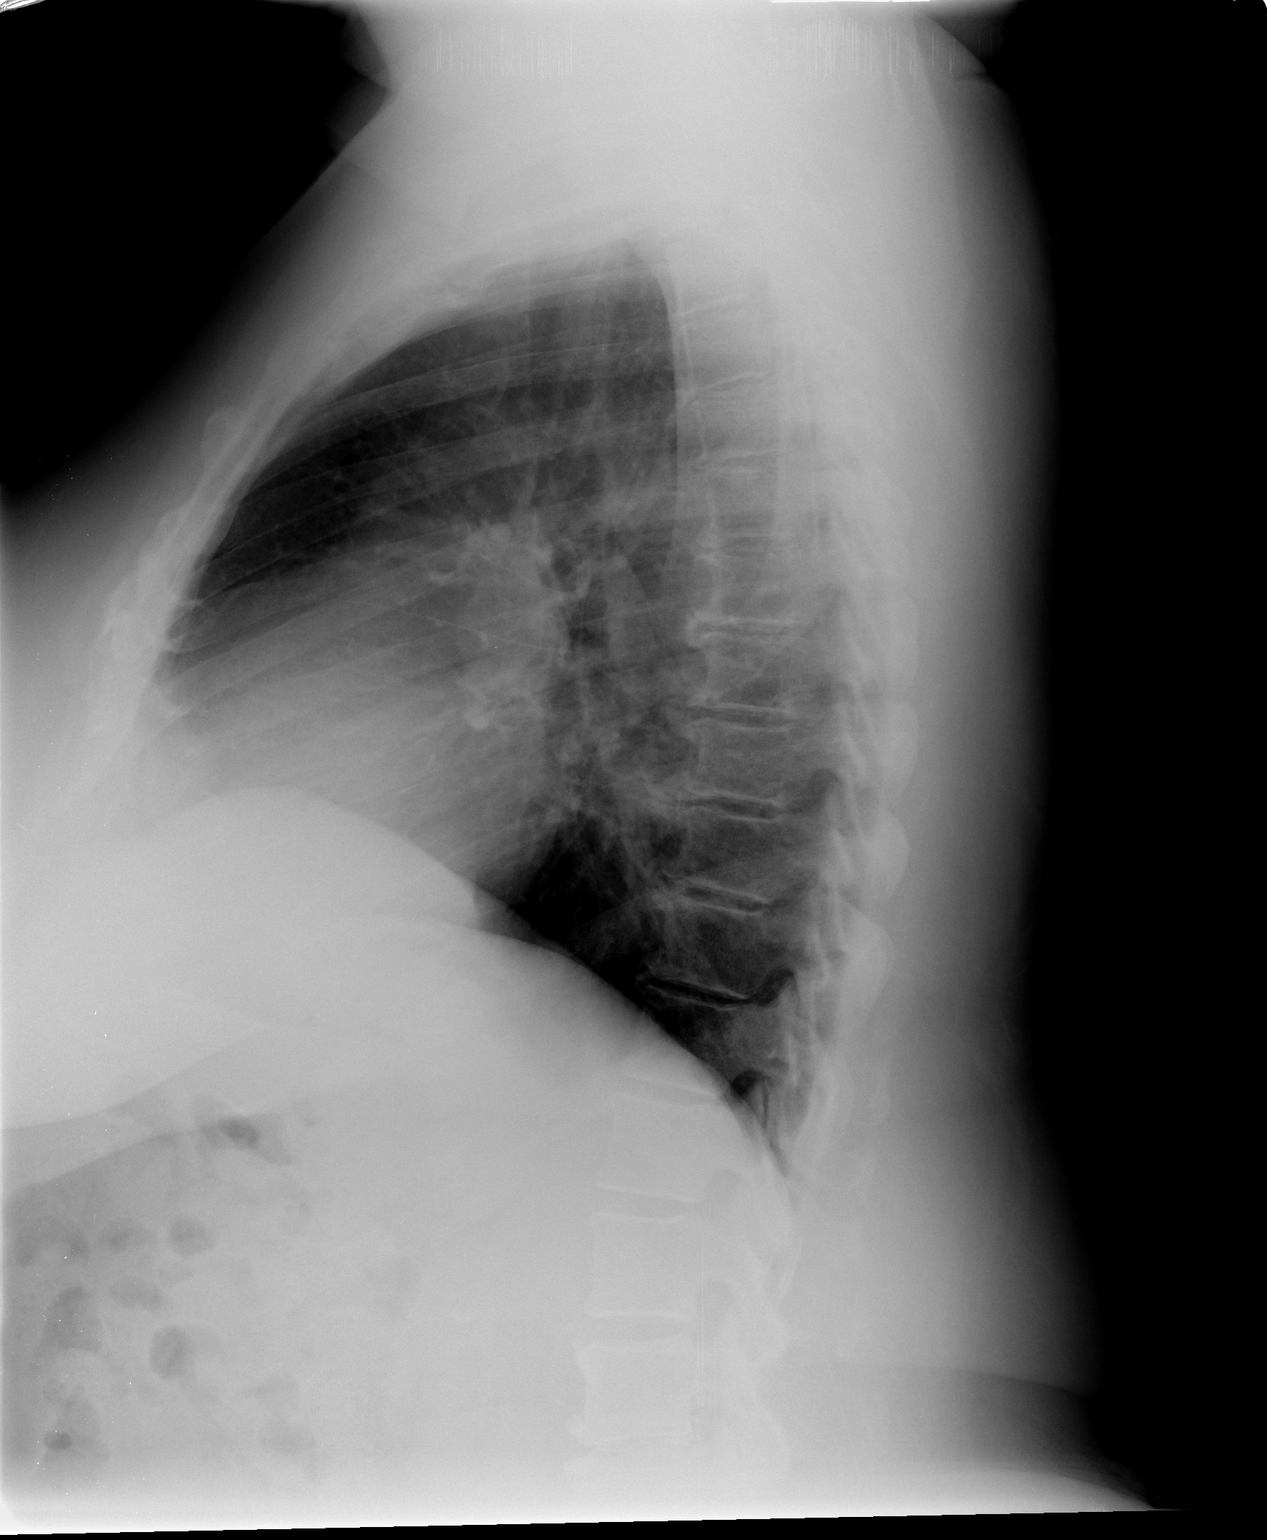

[2 of 2 positions shown; findings below may reference images not displayed]

FINDINGS: Cardiomediastinal silhouette is unremarkable.  No acute
infiltrate or pleural effusion.  No pulmonary edema.  Mild
degenerative changes thoracic spine.
IMPRESSION: No active disease.

## 2014-07-20 ENCOUNTER — Other Ambulatory Visit: Payer: Self-pay | Admitting: *Deleted

## 2014-07-21 MED ORDER — MEDROXYPROGESTERONE ACETATE 5 MG PO TABS: ORAL_TABLET | ORAL | Status: DC

## 2015-01-17 ENCOUNTER — Encounter: Payer: Self-pay | Admitting: Family Medicine

## 2015-01-17 ENCOUNTER — Ambulatory Visit (INDEPENDENT_AMBULATORY_CARE_PROVIDER_SITE_OTHER): Payer: 59 | Admitting: Family Medicine

## 2015-01-17 ENCOUNTER — Other Ambulatory Visit (INDEPENDENT_AMBULATORY_CARE_PROVIDER_SITE_OTHER): Payer: 59

## 2015-01-17 VITALS — BP 142/83 | HR 81 | Ht 62.0 in | Wt 215.0 lb

## 2015-01-17 DIAGNOSIS — Z87891 Personal history of nicotine dependence: Secondary | ICD-10-CM

## 2015-01-17 DIAGNOSIS — Z780 Asymptomatic menopausal state: Secondary | ICD-10-CM

## 2015-01-17 DIAGNOSIS — M25562 Pain in left knee: Secondary | ICD-10-CM

## 2015-01-17 DIAGNOSIS — I10 Essential (primary) hypertension: Secondary | ICD-10-CM

## 2015-01-17 DIAGNOSIS — R202 Paresthesia of skin: Secondary | ICD-10-CM

## 2015-01-17 DIAGNOSIS — R739 Hyperglycemia, unspecified: Secondary | ICD-10-CM

## 2015-01-17 LAB — POCT GLYCOSYLATED HEMOGLOBIN (HGB A1C): Hemoglobin A1C: 5.6

## 2015-01-17 MED ORDER — ESTRADIOL 1 MG PO TABS: 1.0000 mg | ORAL_TABLET | Freq: Every day | ORAL | Status: DC

## 2015-01-18 ENCOUNTER — Encounter: Payer: Self-pay | Admitting: Family Medicine

## 2015-01-18 ENCOUNTER — Telehealth: Payer: Self-pay | Admitting: *Deleted

## 2015-01-18 DIAGNOSIS — Z87891 Personal history of nicotine dependence: Secondary | ICD-10-CM | POA: Insufficient documentation

## 2015-01-18 DIAGNOSIS — R200 Anesthesia of skin: Secondary | ICD-10-CM | POA: Insufficient documentation

## 2015-01-18 DIAGNOSIS — R202 Paresthesia of skin: Secondary | ICD-10-CM

## 2015-01-18 LAB — LDL CHOLESTEROL, DIRECT: Direct LDL: 112 mg/dL — ABNORMAL HIGH

## 2015-01-18 LAB — BASIC METABOLIC PANEL
BUN: 17 mg/dL (ref 6–23)
CALCIUM: 10.2 mg/dL (ref 8.4–10.5)
CO2: 29 mEq/L (ref 19–32)
CREATININE: 0.86 mg/dL (ref 0.50–1.10)
Chloride: 104 mEq/L (ref 96–112)
Glucose, Bld: 79 mg/dL (ref 70–99)
POTASSIUM: 4.4 meq/L (ref 3.5–5.3)
SODIUM: 143 meq/L (ref 135–145)

## 2015-01-18 LAB — TSH: TSH: 2.582 u[IU]/mL (ref 0.350–4.500)

## 2015-01-18 MED ORDER — MEDROXYPROGESTERONE ACETATE 5 MG PO TABS: ORAL_TABLET | ORAL | Status: DC

## 2015-01-18 NOTE — Telephone Encounter (Signed)
-----   Message from Lizbeth BarkMelanie L Ceresi sent at 01/18/2015  9:08 AM EST ----- Regarding: refill request Contact: [phone] Pt states refill on  (PROVERA) 5 MG tablet, with new directions from a half of tablet to a whole once a day. Pharmacy is in chart.

## 2015-01-18 NOTE — Telephone Encounter (Signed)
Rhea Yes thanks I sent it in [place]! Denny LevySara Neal

## 2015-01-18 NOTE — Progress Notes (Signed)
Patient ID: Holly Herring, female   DOB: 04/14/1951, 64 y.o.   MRN: [id number]  [name] - 64 y.o. female MRN [id number]  Date of birth: [birth year]    SUBJECTIVE:     Complaint of bilateral hand numbness and tingling that she notes first thing in the morning after she awakens, it usually goes away in about 5 minutes. She does not awaken during the night with any numbness, tingling or pain in her hands or arms. Says she always sleeps with her elbows met and her forearms sucked up against her chest. Wonders if that has some relation. This numbness and tingling has been noticeable for the last 4-6 months. During the day she has no symptoms. She's had no problems picking up items, holding onto items or any issues with manual dexterity. She's noted no skin color changes on her hands or fingers. Has not had any unusual cold or heat sensation in her hands or feet. She is mostly concerned because she's afraid that maybe she's having some type of circulation problem. #2. Shoulder pain. Bilateral but right greater than left. Right-hand dominant. Shoulder pain is mild to or 3 out of 10 at its worst. Very intermittent. Not necessarily worse with overhead activities. She has not had to curtail any of her activities secondary to shoulder pain. No specific injury. She's not sure how long this is been going on probably several months. #3. Needs a refill on her hormone replacement therapy. When I saw her last in my [facility] clinic, we had decreased her estradiol portion and she has not been doing as well with the lower dose. Her night sweats and overall hot flashes are back full force. She's had no vaginal bleeding. She continues on the Provera. She would like to go back In dose on the estradiol. She also think she's been a little bit more cranky with the lower dose. #4. Regarding her hypertension, says she has not been into clinic her regular visit because she's been doing extremely well. Says she did not think  she should follow-up unless she was having a problem. #5. Regarding her left knee pain, she had total knee replacement done in 2014 and is very happy with the results. ROS:     See history of present illness above. Additional pertinent review of systems is negative for unusual weight change, fever, chills, weakness. She's had no unusual bruising. No unusual skin changes or rash. Denies chest pain or shortness of breath. Has not had any lower extremity edema. Hand numbness and tingling is only for the first few minutes upon awakening in the morning area she has no other times of paresthesias in her extremities.  PERTINENT  PMH / PSH FH / / SH:  Past Medical, Surgical, Social, and Family History Reviewed & Updated in the EMR.  Pertinent findings include:  Hypertension Status post left TKR Postmenopausal on hormone replacement Overweight S/p unilateral oophrectomy S/p some type hernia repair Smoking status: Former Smoker     Quit date: 11/26/1978  . Smokeless tobacco: Never Used  . Alcohol Use: 0.6 oz/week    1 Glasses of wine per week         OBJECTIVE: BP 142/83 mmHg  Pulse 81  Ht _0  (1.575 m)  Wt 215 lb (97.523 kg)  BMI 39.31 kg/m2  Physical Exam:  Vital signs are reviewed. GEN.: Well-developed female no acute distress WRISTS: Bilaterally she has full range of motion flexion extension. Neg Tinel, negative Phalen. Normal grip strength, normal  dexterity all fingers. No thenar atrophy. NEURO: Intact sensation to soft touch and 2 point discrimination bilaterally in hands both dorsal and volar areas. VASCULAR: Radial pulses 2+ bilaterally equal. Normal Refill hands: EXTREMITY: Bilateral elbow full range of motion and joint is normal without any erythema or stiffness. Shoulders bilaterally have full range of motion. Very mild pain with supraspinatus testing on the right but she has intact strength. She has normal liftoff test and internal rotation on the right. Otherwise  her bilateral shoulder exam is normal. There is no before meals joint tenderness or deformity. The muscle bulk and tone in her upper extremities is bilaterally normal. KNEE: Left. Full extension. Well-healed TKR scar. Calf is soft.  ASSESSMENT & PLAN:  See problem based charting & AVS for pt instructions.

## 2015-01-18 NOTE — Assessment & Plan Note (Signed)
Sounds like she has had some significant increase in her postmenopausal symptoms of hot flashes and possibly some irritability as well. We'll continue her current dose of Provera and take her back to the original dose of the estradiol for the next year. I would like to decrease that ultimately.

## 2015-01-18 NOTE — Assessment & Plan Note (Signed)
This does not appear to be carpal tunnel syndrome. It seems very isolated to the first few minutes after awakening so I suspect it is positional in nature while she sleeping. Certainly seems benign. I reassured her. We'll just follow this and as long as it doesn't get worse or have new symptoms then I think we don't need to do anything different. I will check a TSH just for completeness as we are checking other lab work.

## 2015-01-18 NOTE — Assessment & Plan Note (Signed)
Left knee is doing extremely well. She's very happy with the knee replacement.

## 2015-01-18 NOTE — Telephone Encounter (Signed)
Is this ok to refill?  

## 2015-01-18 NOTE — Assessment & Plan Note (Signed)
We discussed the fact that she needs to come in for regular visits for follow-up of her hypertension. We'll check her LDL and basic metabolic profile today.

## 2015-01-24 ENCOUNTER — Other Ambulatory Visit: Payer: Self-pay | Admitting: *Deleted

## 2015-01-25 MED ORDER — LISINOPRIL 10 MG PO TABS: 10.0000 mg | ORAL_TABLET | Freq: Every day | ORAL | Status: DC

## 2015-06-27 ENCOUNTER — Other Ambulatory Visit: Payer: Self-pay | Admitting: *Deleted

## 2015-06-27 MED ORDER — TRIAMTERENE-HCTZ 37.5-25 MG PO TABS: 1.0000 | ORAL_TABLET | Freq: Every day | ORAL | Status: DC

## 2015-12-09 ENCOUNTER — Other Ambulatory Visit: Payer: Self-pay | Admitting: Family Medicine

## 2016-01-05 ENCOUNTER — Other Ambulatory Visit: Payer: Self-pay | Admitting: Family Medicine

## 2016-01-10 NOTE — Telephone Encounter (Signed)
Contacted pt and informed her of below. [name], Michelle Vanhise D, CMA  

## 2016-01-10 NOTE — Telephone Encounter (Signed)
Dear [facility] She needs to be seen before any more refills Saw her last year THANKS! Denny Levy

## 2016-01-25 ENCOUNTER — Encounter: Payer: Self-pay | Admitting: Family Medicine

## 2016-01-25 ENCOUNTER — Other Ambulatory Visit: Payer: Self-pay

## 2016-01-25 ENCOUNTER — Ambulatory Visit (INDEPENDENT_AMBULATORY_CARE_PROVIDER_SITE_OTHER): Payer: Medicare Other | Admitting: Family Medicine

## 2016-01-25 VITALS — BP 130/80 | HR 87 | Temp 98.1°F | Ht 62.0 in | Wt 210.0 lb

## 2016-01-25 DIAGNOSIS — Z299 Encounter for prophylactic measures, unspecified: Secondary | ICD-10-CM | POA: Diagnosis not present

## 2016-01-25 DIAGNOSIS — M25562 Pain in left knee: Secondary | ICD-10-CM

## 2016-01-25 DIAGNOSIS — Z9889 Other specified postprocedural states: Secondary | ICD-10-CM

## 2016-01-25 DIAGNOSIS — Z1231 Encounter for screening mammogram for malignant neoplasm of breast: Secondary | ICD-10-CM

## 2016-01-25 DIAGNOSIS — Z780 Asymptomatic menopausal state: Secondary | ICD-10-CM | POA: Diagnosis not present

## 2016-01-25 DIAGNOSIS — I10 Essential (primary) hypertension: Secondary | ICD-10-CM | POA: Diagnosis not present

## 2016-01-25 DIAGNOSIS — Z8639 Personal history of other endocrine, nutritional and metabolic disease: Secondary | ICD-10-CM

## 2016-01-25 LAB — COMPLETE METABOLIC PANEL WITH GFR
ALT: 25 U/L (ref 6–29)
AST: 28 U/L (ref 10–35)
Albumin: 4 g/dL (ref 3.6–5.1)
Alkaline Phosphatase: 46 U/L (ref 33–130)
BUN: 18 mg/dL (ref 7–25)
CHLORIDE: 102 mmol/L (ref 98–110)
CO2: 26 mmol/L (ref 20–31)
CREATININE: 0.77 mg/dL (ref 0.50–0.99)
Calcium: 9.3 mg/dL (ref 8.6–10.4)
GFR, Est African American: >89 mL/min (ref >=60)
GFR, Est Non African American: 82 mL/min (ref >=60)
GLUCOSE: 85 mg/dL (ref 65–99)
POTASSIUM: 4 mmol/L (ref 3.5–5.3)
SODIUM: 140 mmol/L (ref 135–146)
Total Bilirubin: 0.4 mg/dL (ref 0.2–1.2)
Total Protein: 7.6 g/dL (ref 6.1–8.1)

## 2016-01-25 LAB — POCT GLYCOSYLATED HEMOGLOBIN (HGB A1C): Hemoglobin A1C: 5.7

## 2016-01-25 LAB — LDL CHOLESTEROL, DIRECT: Direct LDL: 108 mg/dL (ref <130)

## 2016-01-25 LAB — HEPATITIS C ANTIBODY: HCV AB: NEGATIVE

## 2016-01-25 MED ORDER — LISINOPRIL 10 MG PO TABS: 10.0000 mg | ORAL_TABLET | Freq: Every day | ORAL | Status: DC

## 2016-01-25 MED ORDER — MEDROXYPROGESTERONE ACETATE 5 MG PO TABS: ORAL_TABLET | ORAL | Status: DC

## 2016-01-25 MED ORDER — ESTRADIOL 1 MG PO TABS: 1.0000 mg | ORAL_TABLET | Freq: Every day | ORAL | Status: DC

## 2016-01-25 NOTE — Assessment & Plan Note (Signed)
Continue current meds, refilled.

## 2016-01-25 NOTE — Patient Instructions (Signed)
I will send you a note about your blood work. You look GREAT! Please get your mammogram. I willplan on seeing you in one year.

## 2016-01-25 NOTE — Progress Notes (Signed)
   Subjective:    Patient ID: Holly Herring, female    DOB: 1951/07/13, 65 y.o.   MRN: [id number]  HPI   1. Hypertension: Taking her medicines regular. No problems with it. No headaches, no chest pain, shortness of breath.  #2.  Postmenopausal hot flashes much better only increased her back to the original dose. She is in the refills.  #3. Obesity: she said trying to actively lose some weight now. Doing is mostly by restriction of foods. Is not having a lot of luck with weight loss.  #4. Status post TKR on the left. Still has some intermittent knee pains occasionally but overall much better  PERTINENT  PMH / PSH: I have reviewed the patient's medications, allergies, past medical and surgical history. Pertinent findings that relate to today's visit / issues include: Previous smoker Review of Systems  no fever, sweats, chills.    Objective:   Physical Exam  Vital signs reviewed. GENERAL: Well-developed, well-nourished, no acute distress. CARDIOVASCULAR: Regular rate and rhythm no murmur gallop or rub LUNGS: Clear to auscultation bilaterally, no rales or wheeze. ABDOMEN: Soft positive bowel sounds NEURO: No gross focal neurological deficits. MSK: Movement of extremity x 4.         Assessment & Plan:

## 2016-01-25 NOTE — Assessment & Plan Note (Signed)
Discussed mammogram, colonoscopy.

## 2016-01-25 NOTE — Assessment & Plan Note (Signed)
Doing well 

## 2016-01-25 NOTE — Assessment & Plan Note (Signed)
Well controlled--continue current meds. Check labs.

## 2016-01-27 ENCOUNTER — Encounter: Payer: Self-pay | Admitting: Family Medicine

## 2016-02-07 ENCOUNTER — Ambulatory Visit
Admission: RE | Admit: 2016-02-07 | Discharge: 2016-02-07 | Disposition: A | Discharge status: Home / Self Care | Payer: Medicare Other | Source: Ambulatory Visit

## 2016-02-07 DIAGNOSIS — Z9889 Other specified postprocedural states: Secondary | ICD-10-CM

## 2016-02-07 DIAGNOSIS — Z1231 Encounter for screening mammogram for malignant neoplasm of breast: Secondary | ICD-10-CM

## 2016-06-27 ENCOUNTER — Other Ambulatory Visit: Payer: Self-pay | Admitting: Family Medicine

## 2017-01-21 ENCOUNTER — Other Ambulatory Visit: Payer: Self-pay | Admitting: Family Medicine

## 2017-02-25 ENCOUNTER — Other Ambulatory Visit: Payer: Self-pay | Admitting: Family Medicine

## 2017-02-25 DIAGNOSIS — Z1231 Encounter for screening mammogram for malignant neoplasm of breast: Secondary | ICD-10-CM

## 2017-03-13 ENCOUNTER — Ambulatory Visit
Admission: RE | Admit: 2017-03-13 | Discharge: 2017-03-13 | Disposition: A | Discharge status: Home / Self Care | Payer: Medicare Other | Source: Ambulatory Visit | Attending: Family Medicine | Admitting: Family Medicine

## 2017-03-13 ENCOUNTER — Other Ambulatory Visit: Payer: Self-pay | Admitting: Family Medicine

## 2017-03-13 DIAGNOSIS — Z1231 Encounter for screening mammogram for malignant neoplasm of breast: Secondary | ICD-10-CM

## 2017-03-21 ENCOUNTER — Other Ambulatory Visit: Payer: Self-pay | Admitting: Family Medicine

## 2017-11-26 HISTORY — PX: ABDOMINAL HYSTERECTOMY: SHX81

## 2019-09-21 ENCOUNTER — Other Ambulatory Visit: Payer: Self-pay

## 2019-09-22 ENCOUNTER — Other Ambulatory Visit: Payer: Self-pay

## 2019-09-22 ENCOUNTER — Encounter: Payer: Self-pay | Admitting: Family Medicine

## 2019-09-22 ENCOUNTER — Ambulatory Visit (INDEPENDENT_AMBULATORY_CARE_PROVIDER_SITE_OTHER): Payer: Medicare Other | Admitting: Family Medicine

## 2019-09-22 VITALS — BP 128/76 | HR 91 | Temp 98.2°F | Ht 61.0 in | Wt 200.0 lb

## 2019-09-22 DIAGNOSIS — E2839 Other primary ovarian failure: Secondary | ICD-10-CM | POA: Diagnosis not present

## 2019-09-22 DIAGNOSIS — Z0000 Encounter for general adult medical examination without abnormal findings: Secondary | ICD-10-CM | POA: Diagnosis not present

## 2019-09-22 DIAGNOSIS — Z23 Encounter for immunization: Secondary | ICD-10-CM | POA: Diagnosis not present

## 2019-09-22 LAB — CBC
HCT: 37.9 % (ref 36.0–46.0)
Hemoglobin: 12.3 g/dL (ref 12.0–15.0)
MCHC: 32.4 g/dL (ref 30.0–36.0)
MCV: 86.7 fl (ref 78.0–100.0)
Platelets: 333 10*3/uL (ref 150.0–400.0)
RBC: 4.37 Mil/uL (ref 3.87–5.11)
RDW: 14.9 % (ref 11.5–15.5)
WBC: 5.7 10*3/uL (ref 4.0–10.5)

## 2019-09-22 LAB — COMPREHENSIVE METABOLIC PANEL
ALT: 12 U/L (ref 0–35)
AST: 18 U/L (ref 0–37)
Albumin: 4.2 g/dL (ref 3.5–5.2)
Alkaline Phosphatase: 47 U/L (ref 39–117)
BUN: 18 mg/dL (ref 6–23)
CO2: 28 mEq/L (ref 19–32)
Calcium: 9.6 mg/dL (ref 8.4–10.5)
Chloride: 103 mEq/L (ref 96–112)
Creatinine, Ser: 0.73 mg/dL (ref 0.40–1.20)
GFR: 96.3 mL/min (ref >60.00)
Glucose, Bld: 93 mg/dL (ref 70–99)
Potassium: 3.9 mEq/L (ref 3.5–5.1)
Sodium: 138 mEq/L (ref 135–145)
Total Bilirubin: 0.4 mg/dL (ref 0.2–1.2)
Total Protein: 7.2 g/dL (ref 6.0–8.3)

## 2019-09-22 LAB — LIPID PANEL
Cholesterol: 255 mg/dL — ABNORMAL HIGH (ref 0–200)
HDL: 108 mg/dL (ref >39.00)
LDL Cholesterol: 136 mg/dL — ABNORMAL HIGH (ref 0–99)
NonHDL: 147.47
Total CHOL/HDL Ratio: 2
Triglycerides: 56 mg/dL (ref 0.0–149.0)
VLDL: 11.2 mg/dL (ref 0.0–40.0)

## 2019-09-22 MED ORDER — LISINOPRIL-HYDROCHLOROTHIAZIDE 10-12.5 MG PO TABS: 1.0000 | ORAL_TABLET | Freq: Every day | ORAL | 2 refills | Status: DC

## 2019-09-22 NOTE — Patient Instructions (Signed)
Give [place] 2-3 business days to get the results of your labs back.   Call Center for [facility] at [facility] at [phone] for an appointment.  They are located at [address], [place], [place], [address] (right across the hall from our office).  Keep the diet clean and stay active.  Let us know if you need anything.  Healthy Eating Plan Many factors influence your heart health, including eating and exercise habits. Heart (coronary) risk increases with abnormal blood fat (lipid) levels. Heart-healthy meal planning includes limiting unhealthy fats, increasing healthy fats, and making other small dietary changes. This includes maintaining a healthy body weight to help keep lipid levels within a normal range.  WHAT IS MY PLAN?  Your health care provider recommends that you:  Drink a glass of water before meals to help with satiety.  Eat slowly.  An alternative to the water is to add Metamucil. This will help with satiety as well. It does contain calories, unlike water.  WHAT TYPES OF FAT SHOULD I CHOOSE?  Choose healthy fats more often. Choose monounsaturated and polyunsaturated fats, such as olive oil and canola oil, flaxseeds, walnuts, almonds, and seeds.  Eat more omega-3 fats. Good choices include salmon, mackerel, sardines, tuna, flaxseed oil, and ground flaxseeds. Aim to eat fish at least two times each week.  Avoid foods with partially hydrogenated oils in them. These contain trans fats. Examples of foods that contain trans fats are stick margarine, some tub margarines, cookies, crackers, and other baked goods. If you are going to avoid a fat, this is the one to avoid!  WHAT GENERAL GUIDELINES DO I NEED TO FOLLOW?  Check food labels carefully to identify foods with trans fats. Avoid these types of options when possible.  Fill one half of your plate with vegetables and green salads. Eat 4-5 servings of vegetables per day. A serving of vegetables  equals 1 cup of raw leafy vegetables,  cup of raw or cooked cut-up vegetables, or  cup of vegetable juice.  Fill one fourth of your plate with whole grains. Look for the word "whole" as the first word in the ingredient list.  Fill one fourth of your plate with lean protein foods.  Eat 4-5 servings of fruit per day. A serving of fruit equals one medium whole fruit,  cup of dried fruit,  cup of fresh, frozen, or canned fruit. Try to avoid fruits in cups/syrups as the sugar content can be high.  Eat more foods that contain soluble fiber. Examples of foods that contain this type of fiber are apples, broccoli, carrots, beans, peas, and barley. Aim to get 20-30 g of fiber per day.  Eat more home-cooked food and less restaurant, buffet, and fast food.  Limit or avoid alcohol.  Limit foods that are high in starch and sugar.  Avoid fried foods when able.  Cook foods by using methods other than frying. Baking, boiling, grilling, and broiling are all great options. Other fat-reducing suggestions include: ? Removing the skin from poultry. ? Removing all visible fats from meats. ? Skimming the fat off of stews, soups, and gravies before serving them. ? Steaming vegetables in water or broth.  Lose weight if you are overweight. Losing just 5-10% of your initial body weight can help your overall health and prevent diseases such as diabetes and heart disease.  Increase your consumption of nuts, legumes, and seeds to 4-5 servings per week. One serving of dried beans or legumes  equals  cup after being cooked, one serving of nuts equals 1 ounces, and one serving of seeds equals  ounce or 1 tablespoon.  WHAT ARE GOOD FOODS CAN I EAT? Grains Grainy breads (try to find bread that is 3 g of fiber per slice or greater), oatmeal, light popcorn. Whole-grain cereals. Rice and pasta, including brown rice and those that are made with whole wheat. Edamame pasta is a great alternative to grain pasta. It has a  higher protein content. Try to avoid significant consumption of white bread, sugary cereals, or pastries/baked goods.  Vegetables All vegetables. Cooked white potatoes do not count as vegetables.  Fruits All fruits, but limit pineapple and bananas as these fruits have a higher sugar content.  Meats and Other Protein Sources Lean, well-trimmed beef, veal, pork, and lamb. Chicken and [place] without skin. All fish and shellfish. Wild duck, rabbit, pheasant, and venison. Egg whites or low-cholesterol egg substitutes. Dried beans, peas, lentils, and tofu.Seeds and most nuts.  Dairy Low-fat or nonfat cheeses, including ricotta, string, and mozzarella. Skim or 1% milk that is liquid, powdered, or evaporated. Buttermilk that is made with low-fat milk. Nonfat or low-fat yogurt. Soy/Almond milk are good alternatives if you cannot handle dairy.  Beverages Water is the best for you. Sports drinks with less sugar are more desirable unless you are a highly active athlete.  Sweets and Desserts Sherbets and fruit ices. Honey, jam, marmalade, jelly, and syrups. Dark chocolate.  Eat all sweets and desserts in moderation.  Fats and Oils Nonhydrogenated (trans-free) margarines. Vegetable oils, including soybean, sesame, sunflower, olive, peanut, safflower, corn, canola, and cottonseed. Salad dressings or mayonnaise that are made with a vegetable oil. Limit added fats and oils that you use for cooking, baking, salads, and as spreads.  Other Cocoa powder. Coffee and tea. Most condiments.  The items listed above may not be a complete list of recommended foods or beverages. Contact your dietitian for more options.

## 2019-09-22 NOTE — Progress Notes (Signed)
Chief Complaint  Patient presents with  . New Patient (Initial Visit)     Well Woman Holly Herring is here for a complete physical.   Her last physical was >1 year ago.  Current diet: in general, diet is fair. Current exercise: very active caring for husband. Weight is slightly increasing and she denies daytime fatigue. No LMP recorded. Patient is postmenopausal. Seatbelt? Yes  Health Maintenance Colonoscopy- Yes Shingrix- No DEXA- No Mammogram- Yes Tetanus- No Pneumonia- No Hep C screen- Yes  Past Medical History:  Diagnosis Date  . Arthritis   . Hypertension      Past Surgical History:  Procedure Laterality Date  . BREAST SURGERY Bilateral    breast reduction  . HERNIA REPAIR    . KNEE ARTHROSCOPY     Dr Melba Coon  . OOPHORECTOMY     one ovary removed due to ectopic  . REDUCTION MAMMAPLASTY Bilateral 2002  . 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   Procedure: TOTAL KNEE ARTHROPLASTY;  Surgeon: Kerin Salen, MD;  Location: [facility];  Service: Orthopedics;  Laterality: Left;  DEPUY/SIGMA    Medications  Current Outpatient Medications on File Prior to Visit  Medication Sig Dispense Refill  . aspirin EC 81 MG tablet Take 81 mg by mouth daily.    . calcium carbonate (CALCIUM 600) 600 MG TABS tablet Take 600 mg by mouth daily with breakfast.    . doxylamine, Sleep, (UNISOM) 25 MG tablet Take 25 mg by mouth at bedtime as needed.    Marland Kitchen estradiol (ESTRACE) 1 MG tablet TAKE ONE TABLET BY MOUTH ONCE DAILY (Patient taking differently: Take 1/2 tablet by mouth once daily) 90 tablet 3  . ibuprofen (ADVIL) 200 MG tablet Take 200 mg by mouth 4 (four) times daily.    Marland Kitchen lisinopril-hydrochlorothiazide (ZESTORETIC) 10-12.5 MG tablet Take 1 tablet by mouth daily.    . Omega-3 Fatty Acids (FISH OIL) 1000 MG CAPS Take 1 capsule by mouth daily.    . vitamin B-12 (CYANOCOBALAMIN) 1000 MCG tablet Take 1,000 mcg by mouth daily.    . vitamin C (ASCORBIC ACID) 500 MG tablet Take 500 mg by  mouth daily.     Allergies Allergies  Allergen Reactions  . Other     NO BLOOD OR BLOOD PRODUCTS  . Penicillins Hives  . Percocet [Oxycodone-Acetaminophen] Itching    Has to take benadryl prior    Review of Systems: Constitutional:  no sweats Eye:  no recent significant change in vision Ear/Nose/Mouth/Throat:  Ears:  No changes in hearing Nose/Mouth/Throat:  no complaints of nasal congestion, no sore throat Cardiovascular: no chest pain Respiratory:  No cough and no shortness of breath Gastrointestinal:  no abdominal pain, no change in bowel habits GU:  Female: negative for dysuria or pelvic pain Musculoskeletal/Extremities: Pulled muscle in back recently; otherwise no pain of the joints Integumentary (Skin/Breast):  no abnormal skin lesions reported Neurologic:  no headaches Psychiatric:  no anxiety, no depression Endocrine:  denies unexplained weight changes Hematologic/Lymphatic:  no abnormal bleeding  Exam BP 128/76 (BP Location: Left Arm, Patient Position: Sitting, Cuff Size: Normal)   Pulse 91   Temp 98.2 F (36.8 C) (Temporal)   Ht 5\' 1"  (1.549 m)   Wt 200 lb (90.7 kg)   SpO2 98%   BMI 37.79 kg/m  General:  well developed, well nourished, in no apparent distress Skin:  no significant moles, warts, or growths Head:  no masses, lesions, or tenderness Eyes:  pupils equal and round, sclera  anicteric without injection Ears:  canals without lesions, TMs shiny without retraction, no obvious effusion, no erythema Nose:  nares patent, septum midline, mucosa normal, and no drainage or sinus tenderness Throat/Pharynx:  lips and gingiva without lesion; tongue and uvula midline; non-inflamed pharynx; no exudates or postnasal drainage Neck: neck supple without adenopathy, thyromegaly, or masses Lungs:  clear to auscultation, breath sounds equal bilaterally, no respiratory distress Cardio:  regular rate and rhythm, no bruits or LE edema Abdomen:  abdomen soft, nontender; bowel  sounds normal; no masses or organomegaly Genital: Deferred Musculoskeletal:  symmetrical muscle groups noted without atrophy or deformity Extremities:  no clubbing, cyanosis, or edema, no deformities, no skin discoloration Neuro:  gait normal; deep tendon reflexes normal and symmetric Psych: well oriented with normal range of affect and appropriate judgment/insight  Assessment and Plan  Well adult exam - Plan: CBC, Comprehensive metabolic panel, Lipid panel  Estrogen deficiency - Plan: DG Bone Density  Need for vaccination against Streptococcus pneumoniae - Plan: Pneumococcal polysaccharide vaccine 23-valent greater than or equal to 2yo subcutaneous/IM   Well 68 y.o. female. Counseled on diet and exercise.  Healthy diet handout given.  Brief discussion of keto diet was also had. Declines the tetanus shot for now. Other orders as above. Follow up in 6 months pending the above workup. The patient voiced understanding and agreement to the plan.  [name] Brent, DO 09/22/19 12:34 PM

## 2019-09-29 ENCOUNTER — Ambulatory Visit (HOSPITAL_BASED_OUTPATIENT_CLINIC_OR_DEPARTMENT_OTHER)
Admission: RE | Admit: 2019-09-29 | Discharge: 2019-09-29 | Disposition: A | Discharge status: Home / Self Care | Payer: Medicare Other | Source: Ambulatory Visit | Attending: Family Medicine | Admitting: Family Medicine

## 2019-09-29 ENCOUNTER — Other Ambulatory Visit: Payer: Self-pay

## 2019-09-29 DIAGNOSIS — E2839 Other primary ovarian failure: Secondary | ICD-10-CM | POA: Diagnosis present

## 2019-09-29 DIAGNOSIS — Z780 Asymptomatic menopausal state: Secondary | ICD-10-CM | POA: Diagnosis not present

## 2019-10-21 ENCOUNTER — Encounter: Payer: Self-pay | Admitting: Obstetrics & Gynecology

## 2019-10-21 ENCOUNTER — Ambulatory Visit (INDEPENDENT_AMBULATORY_CARE_PROVIDER_SITE_OTHER): Payer: Medicare Other | Admitting: Obstetrics & Gynecology

## 2019-10-21 ENCOUNTER — Other Ambulatory Visit: Payer: Self-pay

## 2019-10-21 VITALS — BP 145/65 | HR 85 | Ht 60.5 in | Wt 201.0 lb

## 2019-10-21 DIAGNOSIS — N814 Uterovaginal prolapse, unspecified: Secondary | ICD-10-CM

## 2019-10-21 NOTE — Patient Instructions (Signed)
Pelvic Organ Prolapse °Pelvic organ prolapse is the stretching, bulging, or dropping of pelvic organs into an abnormal position. It happens when the muscles and tissues that surround and support pelvic structures become weak or stretched. Pelvic organ prolapse can involve the: °· Vagina (vaginal prolapse). °· Uterus (uterine prolapse). °· Bladder (cystocele). °· Rectum (rectocele). °· Intestines (enterocele). °When organs other than the vagina are involved, they often bulge into the vagina or protrude from the vagina, depending on how severe the prolapse is. °What are the causes? °This condition may be caused by: °· Pregnancy, labor, and childbirth. °· Past pelvic surgery. °· Decreased production of the hormone estrogen associated with menopause. °· Consistently lifting more than 50 lb (23 kg). °· Obesity. °· Long-term inability to pass stool (chronic constipation). °· A cough that lasts a long time (chronic). °· Buildup of fluid in the abdomen due to certain diseases and other conditions. °What are the signs or symptoms? °Symptoms of this condition include: °· Passing a little urine (loss of bladder control) when you cough, sneeze, strain, and exercise (stress incontinence). This may be worse immediately after childbirth. It may gradually improve over time. °· Feeling pressure in your pelvis or vagina. This pressure may increase when you cough or when you are passing stool. °· A bulge that protrudes from the opening of your vagina. °· Difficulty passing urine or stool. °· Pain in your lower back. °· Pain, discomfort, or disinterest in sex. °· Repeated bladder infections (urinary tract infections). °· Difficulty inserting a tampon. °In some people, this condition causes no symptoms. °How is this diagnosed? °This condition may be diagnosed based on a vaginal and rectal exam. During the exam, you may be asked to cough and strain while you are lying down, sitting, and standing up. Your health care provider will  determine if other tests are required, such as bladder function tests. °How is this treated? °Treatment for this condition may depend on your symptoms. Treatment may include: °· Lifestyle changes, such as changes to your diet. °· Emptying your bladder at scheduled times (bladder training therapy). This can help reduce or avoid urinary incontinence. °· Estrogen. Estrogen may help mild prolapse by increasing the strength and tone of pelvic floor muscles. °· Kegel exercises. These may help mild cases of prolapse by strengthening and tightening the muscles of the pelvic floor. °· A soft, flexible device that helps support the vaginal walls and keep pelvic organs in place (pessary). This is inserted into your vagina by your health care provider. °· Surgery. This is often the only form of treatment for severe prolapse. °Follow these instructions at home: °· Avoid drinking beverages that contain caffeine or alcohol. °· Increase your intake of high-fiber foods. This can help decrease constipation and straining during bowel movements. °· Lose weight if recommended by your health care provider. °· Wear a sanitary pad or adult diapers if you have urinary incontinence. °· Avoid heavy lifting and straining with exercise and work. Do not hold your breath when you perform mild to moderate lifting and exercise activities. Limit your activities as directed by your health care provider. °· Do Kegel exercises as directed by your health care provider. To do this: °? Squeeze your pelvic floor muscles tight. You should feel a tight lift in your rectal area and a tightness in your vaginal area. Keep your stomach, buttocks, and legs relaxed. °? Hold the muscles tight for up to 10 seconds. °? Relax your muscles. °? Repeat this exercise 50 times a day,   or as many times as told by your health care provider. Continue to do this exercise for at least 4-6 weeks, or for as long as told by your health care provider. °· Take over-the-counter and  prescription medicines only as told by your health care provider. °· If you have a pessary, take care of it as told by your health care provider. °· Keep all follow-up visits as told by your health care provider. This is important. °Contact a health care provider if you: °· Have symptoms that interfere with your daily activities or sex life. °· Need medicine to help with the discomfort. °· Notice bleeding from your vagina that is not related to your period. °· Have a fever. °· Have pain or bleeding when you urinate. °· Have bleeding when you pass stool. °· Pass urine when you have sex. °· Have chronic constipation. °· Have a pessary that falls out. °· Have bad smelling vaginal discharge. °· Have an unusual, low pain in your abdomen. °Summary °· Pelvic organ prolapse is the stretching, bulging, or dropping of pelvic organs into an abnormal position. It happens when the muscles and tissues that surround and support pelvic structures become weak or stretched. °· When organs other than the vagina are involved, they often bulge into the vagina or protrude from the vagina, depending on how severe the prolapse is. °· In most cases, this condition needs to be treated only if it produces symptoms. Treatment may include lifestyle changes, estrogen, Kegel exercises, pessary insertion, or surgery. °· Avoid heavy lifting and straining with exercise and work. Do not hold your breath when you perform mild to moderate lifting and exercise activities. Limit your activities as directed by your health care provider. °This information is not intended to replace advice given to you by your health care provider. Make sure you discuss any questions you have with your health care provider. °Document Released: 06/09/2014 Document Revised: 12/04/2017 Document Reviewed: 12/04/2017 °Elsevier Patient Education © 2020 [employer] ° °

## 2019-10-21 NOTE — Progress Notes (Signed)
Subjective:     Holly Herring is a 68 y.o. female here for eval of uterine prolapse. Pt recently moved from [place] to [place] 2 months prev. She had a uterine suspension earlier this year and reports that she feels a bulge again. She reports that prior to the uterine suspension she voiding without difficulty and did not have frequency. Since the surgery, she urgency when she does have to void. She did not leak urine prior ot the procedure or since then.   She is married. Does not have intercourse due to her partners health issues. Her husband is 65 yo and on dialysis. 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        Gynecologic History No LMP recorded. Patient is postmenopausal. Contraception: post menopausal status Last Pap: earlier this year in [place]. Results were: normal Last mammogram: earlier this year in [place]  Obstetric History OB History  Gravida Para Term Preterm AB Living  2 2 2         SAB TAB Ectopic Multiple Live Births          2    # Outcome Date GA Lbr Len/2nd Weight Sex Delivery Anes PTL Lv  2 Term      Vag-Spont     1 Term      Vag-Spont      The following portions of the patient's history were reviewed and updated as appropriate: allergies, current medications, past family history, past medical history, past social history, past surgical history and problem list.  Review of Systems Pertinent items are noted in HPI.    Objective:  BP (!) 145/65   Pulse 85   Ht 5' 0.5" (1.537 m)   Wt 201 lb (91.2 kg)   BMI 38.61 kg/m    CONSTITUTIONAL: Well-developed, well-nourished female in no acute distress.  HENT:  Normocephalic, atraumatic EYES: Conjunctivae and EOM are normal. No scleral icterus.  NECK: Normal range of motion SKIN: Skin is warm and dry. No rash noted. Not diaphoretic.No pallor. Maben: Alert and oriented to person, place, and time. Normal coordination.  GU: EGBUS: no lesions Vagina: no blood in vault Cervix:  no lesion; no mucopurulent d/c Uterus: grade III uterine prolapse. Cervix is noted at the introitus.  Adnexa: no masses; non tender    Assessment:  Uterine prolapse- pt with h/o uterine suspension < 1 year prev. Reviewed treatment options including pressary and surgery with hysterectomy and suspension of vaginal cuff and repair of cystocele.  Plan:   F/u after the 1st of the year when her personal situation and schedule is more stable.  She will decide on treatment at that time. She is interested in the pessary.   She wants her op note reviewed before discussing surgery.   Total face-to-face time with patient was 25 min.  Greater than 50% was spent in counseling and coordination of care with the patient.   Davonte Siebenaler L. Harraway-Smith, M.D., Cherlynn June

## 2019-10-21 NOTE — Progress Notes (Signed)
Last pap 2019 in [place] RN

## 2019-12-10 ENCOUNTER — Other Ambulatory Visit: Payer: Self-pay

## 2019-12-10 ENCOUNTER — Ambulatory Visit (INDEPENDENT_AMBULATORY_CARE_PROVIDER_SITE_OTHER): Payer: Medicare Other | Admitting: Obstetrics & Gynecology

## 2019-12-10 ENCOUNTER — Encounter: Payer: Self-pay | Admitting: Obstetrics & Gynecology

## 2019-12-10 VITALS — BP 135/59 | HR 84 | Ht 60.0 in | Wt 206.0 lb

## 2019-12-10 DIAGNOSIS — N814 Uterovaginal prolapse, unspecified: Secondary | ICD-10-CM | POA: Diagnosis not present

## 2019-12-10 MED ORDER — ESTRADIOL 1 MG PO TABS: 1.0000 mg | ORAL_TABLET | Freq: Every day | ORAL | 3 refills | Status: DC

## 2019-12-11 ENCOUNTER — Encounter: Payer: Self-pay | Admitting: Obstetrics & Gynecology

## 2019-12-11 NOTE — Progress Notes (Signed)
History:  69 y.o. G2P2000 here today for f/u of POP. Pt was seen prev for prolapse of tissue into vagina. On exam her cervix was noted to be prolapsed to the introitus. Holly Herring has recently moved to [facility] and reported that she prev has a 'uterine suspension' earlier that year (in 2019). She reports that prior to the uterine suspension she voiding without difficulty and did not have frequency. Since the surgery, she reports urgency when she does have to void. She did not leak urine prior ot the procedure or since then. She reports that since her last visit the urgency is worse.   After her last visit we discussed getting her records and a possible pessary.  Pt would like to proceed with surgical management of this problem.  Her records were reviewed and showed that in Nov 2019 pt had supracervical hysterectomy with uterosacral ligament susp and A/P repair with Obtrix urethropexy.    The following portions of the patient's history were reviewed and updated as appropriate: allergies, current medications, past family history, past medical history, past social history, past surgical history and problem list.  Review of Systems:  Pertinent items are noted in HPI.    Objective:  Physical Exam Blood pressure (!) 135/59, pulse 84, height 5' (1.524 m), weight 206 lb (93.4 kg).  CONSTITUTIONAL: Well-developed, well-nourished female in no acute distress.  HENT:  Normocephalic, atraumatic EYES: Conjunctivae and EOM are normal. No scleral icterus.  NECK: Normal range of motion SKIN: Skin is warm and dry. No rash noted. Not diaphoretic.No pallor. NEUROLGIC: Alert and oriented to person, place, and time. Normal coordination.  Pelvic: deferred  Assessment & Plan:  Cervical prolapse following supracervical hysterectomy with uterosacral ligament susp and A/P repair with Obtrix urethropexy. Given the extensive nature of her UroGYN history, pt will need a referral to UroGYN for further eval and management.   Risk  factors: obesity; heavy lifting   referral to UroGYN  Total face-to-face time with patient was 17 min.  Greater than 50% was spent in counseling and coordination of care with the patient.   Holly Herring, M.D., Evern Core

## 2019-12-29 ENCOUNTER — Telehealth: Payer: Self-pay

## 2019-12-29 NOTE — Telephone Encounter (Signed)
Last appt with you was on 08/2019

## 2019-12-29 NOTE — Telephone Encounter (Signed)
We can just follow up at her 6 mo appt in April unless she wants to discuss sooner. Ty.

## 2019-12-29 NOTE — Telephone Encounter (Signed)
RN informed of PCP instructions.

## 2019-12-29 NOTE — Telephone Encounter (Signed)
Murvin Donning from [employer] with [facility] called in to report that the patients A1C was 5.7 please follow up with her at [phone]   Thanks

## 2020-01-17 ENCOUNTER — Ambulatory Visit: Payer: Medicare Other | Attending: Internal Medicine

## 2020-01-17 DIAGNOSIS — Z23 Encounter for immunization: Secondary | ICD-10-CM | POA: Insufficient documentation

## 2020-01-17 NOTE — Progress Notes (Signed)
   Covid-19 Vaccination Clinic  Name:  Holly Herring    MRN: [id number] DOB: October 16, 1951  01/17/2020  [name] was observed post Covid-19 immunization for 15 minutes without incidence. She was provided with Vaccine Information Sheet and instruction to access the V-Safe system.   [name] was instructed to call 911 with any severe reactions post vaccine: Marland Kitchen Difficulty breathing  . Swelling of your face and throat  . A fast heartbeat  . A bad rash all over your body  . Dizziness and weakness    Immunizations Administered    Name Date Dose VIS Date Route   Pfizer COVID-19 Vaccine 01/17/2020  8:13 AM 0.3 mL 11/06/2019 Intramuscular   Manufacturer: [employer], [employer]   Lot: [id number]   NDC: [address]-9

## 2020-02-09 ENCOUNTER — Ambulatory Visit: Payer: Medicare Other | Attending: Internal Medicine

## 2020-02-09 DIAGNOSIS — Z23 Encounter for immunization: Secondary | ICD-10-CM

## 2020-02-09 NOTE — Progress Notes (Signed)
   Covid-19 Vaccination Clinic  Name:  Holly Herring    MRN: [id number] DOB: 07/20/1951  02/09/2020  [name] was observed post Covid-19 immunization for 15 minutes without incident. She was provided with Vaccine Information Sheet and instruction to access the V-Safe system.   [name] was instructed to call 911 with any severe reactions post vaccine: Marland Kitchen Difficulty breathing  . Swelling of face and throat  . A fast heartbeat  . A bad rash all over body  . Dizziness and weakness   Immunizations Administered    Name Date Dose VIS Date Route   Pfizer COVID-19 Vaccine 02/09/2020  2:18 PM 0.3 mL 11/06/2019 Intramuscular   Manufacturer: [employer], [employer]   Lot: [id number]   NDC: [address]-5

## 2020-02-23 DIAGNOSIS — Z4689 Encounter for fitting and adjustment of other specified devices: Secondary | ICD-10-CM | POA: Diagnosis not present

## 2020-03-02 NOTE — Progress Notes (Signed)
Nurse connected with patient 03/03/20 at 11:00 AM EDT by a telephone enabled telemedicine application and verified that I am speaking with the correct person using two identifiers. Patient stated full name and DOB. Patient gave permission to continue with virtual visit. Patient's location was at home and Nurse's location was at [place] office.   Subjective:   Holly Herring is a 69 y.o. female who presents for an Initial Medicare Annual Wellness Visit.  The Patient was informed that the wellness visit is to identify future health risk and educate and initiate measures that can reduce risk for increased disease through the lifespan.   Describes health as fair, good or great? Good  Review of Systems     Home Safety/Smoke Alarms: Feels safe in home. Smoke alarms in place.  Lives w/ husband in 1 story home.   Female:      Mammo-   Declines today    Dexa scan- 09/29/19       CCS- declines today.    Objective:      Advanced Directives 03/03/2020 01/25/2016 04/13/2013 04/06/2013  Does Patient Have a Medical Advance Directive? Yes No Patient does not have advance directive;Patient would not like information Patient does not have advance directive;Patient would not like information  Type of Public librarian Power of [place];Living will - - -  Does patient want to make changes to medical advance directive? No - Patient declined - - -  Copy of Healthcare Power of Attorney in Chart? No - copy requested - - -  Would patient like information on creating a medical advance directive? - No - patient declined information - -  Pre-existing out of facility DNR order (yellow form or pink MOST form) - - No -    Current Medications (verified) Outpatient Encounter Medications as of 03/03/2020  Medication Sig  . aspirin EC 81 MG tablet Take 81 mg by mouth daily.  . calcium carbonate (CALCIUM 600) 600 MG TABS tablet Take 600 mg by mouth daily with breakfast.  . doxylamine, Sleep, (UNISOM) 25 MG  tablet Take 25 mg by mouth at bedtime as needed.  Marland Kitchen estradiol (ESTRACE) 1 MG tablet Take 1 tablet (1 mg total) by mouth daily.  Marland Kitchen ibuprofen (ADVIL) 200 MG tablet Take 200 mg by mouth 4 (four) times daily.  Marland Kitchen lisinopril-hydrochlorothiazide (ZESTORETIC) 10-12.5 MG tablet Take 1 tablet by mouth daily.  . Omega-3 Fatty Acids (FISH OIL) 1000 MG CAPS Take 1 capsule by mouth daily.  . vitamin B-12 (CYANOCOBALAMIN) 1000 MCG tablet Take 1,000 mcg by mouth daily.  . vitamin C (ASCORBIC ACID) 500 MG tablet Take 500 mg by mouth daily.   No facility-administered encounter medications on file as of 03/03/2020.    Allergies (verified) Other, Penicillins, and Percocet [oxycodone-acetaminophen]   History: Past Medical History:  Diagnosis Date  . Arthritis   . Hypertension    Past Surgical History:  Procedure Laterality Date  . ABDOMINAL HYSTERECTOMY  2019  . BREAST SURGERY Bilateral    breast reduction  . HERNIA REPAIR    . KNEE ARTHROSCOPY     Dr Hollice Gong  . OOPHORECTOMY     one ovary removed due to ectopic  . REDUCTION MAMMAPLASTY Bilateral 2002  . 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 Heart disease Mother   .  Heart disease Brother    Social History   Socioeconomic History  . Marital status: Married    Spouse name: Not on file  . Number of children: Not on file  . Years of education: Not on file  . Highest education level: Not on file  Occupational History  . Not on file  Tobacco Use  . Smoking status: Former Smoker    Quit date: 11/26/1978    Years since quitting: 41.2  . Smokeless tobacco: Never Used  Substance and Sexual Activity  . Alcohol use: Yes    Alcohol/week: 1.0 standard drinks    Types: 1 Glasses of wine per week    Comment: occasionally  . Drug use: No  . Sexual activity: Not  on file  Other Topics Concern  . Not on file  Social History Narrative  . Not on file   Social Determinants of Health   Financial Resource Strain: Low Risk   . Difficulty of Paying Living Expenses: Not hard at all  Food Insecurity: No Food Insecurity  . Worried About Charity fundraiser in the Last Year: Never true  . Ran Out of Food in the Last Year: Never true  Transportation Needs: No Transportation Needs  . Lack of Transportation (Medical): No  . Lack of Transportation (Non-Medical): No  Physical Activity:   . Days of Exercise per Week:   . Minutes of Exercise per Session:   Stress:   . Feeling of Stress :   Social Connections:   . Frequency of Communication with Friends and Family:   . Frequency of Social Gatherings with Friends and Family:   . Attends Religious Services:   . Active Member of Clubs or Organizations:   . Attends Archivist Meetings:   Marland Kitchen Marital Status:     Tobacco Counseling Counseling given: Not Answered   Clinical Intake: Pain : No/denies pain    Activities of Daily Living In your present state of health, do you have any difficulty performing the following activities: 03/03/2020  Hearing? N  Vision? N  Difficulty concentrating or making decisions? N  Walking or climbing stairs? N  Dressing or bathing? N  Doing errands, shopping? N  Preparing Food and eating ? N  Using the Toilet? N  In the past six months, have you accidently leaked urine? N  Do you have problems with loss of bowel control? N  Managing your Medications? N  Managing your Finances? N  Housekeeping or managing your Housekeeping? N  Some recent data might be hidden     Immunizations and Health Maintenance Immunization History  Administered Date(s) Administered  . PFIZER SARS-COV-2 Vaccination 01/17/2020, 02/09/2020  . PPD Test 12/12/2012  . Pneumococcal Polysaccharide-23 09/22/2019   Health Maintenance Due  Topic Date Due  . TETANUS/TDAP  Never done     Patient Care Team: Shelda Pal, DO as PCP - General (Family Medicine)  Indicate any recent Medical Services you may have received from other than [facility] providers in the past year (date may be approximate).     Assessment:   This is a routine wellness examination for [place]. Physical assessment deferred to PCP.  Hearing/Vision screen Unable to assess. This visit is enabled though telemedicine due to Covid 19.   Dietary issues and exercise activities discussed: Current Exercise Habits: Home exercise routine, Type of exercise: walking, Time (Minutes): 10, Frequency (Times/Week): 5, Weekly Exercise (Minutes/Week): 50, Intensity: Mild, Exercise limited by: None identified Diet (meal preparation, eat out, water intake,  caffeinated beverages, dairy products, fruits and vegetables): in general, a "healthy" diet  , well balanced   Goals    . Maintain health and independence      Depression Screen PHQ 2/9 Scores 01/25/2016 01/17/2015  PHQ - 2 Score 0 0    Fall Risk Fall Risk  03/03/2020 01/17/2015  Falls in the past year? 0 No  Number falls in past yr: 0 -  Injury with Fall? 0 -  Follow up Education provided;Falls prevention discussed -   Cognitive Function: Ad8 score reviewed for issues:  Issues making decisions:no  Less interest in hobbies / activities:no  Repeats questions, stories (family complaining):no  Trouble using ordinary gadgets (microwave, computer, phone):no  Forgets the month or year: no  Mismanaging finances: no  Remembering appts:no Daily problems with thinking and/or memory:no Ad8 score is=0         Screening Tests Health Maintenance  Topic Date Due  . TETANUS/TDAP  Never done  . MAMMOGRAM  09/21/2020  . COLONOSCOPY  09/21/2028  . DEXA SCAN  Completed  . Hepatitis C Screening  Completed  . INFLUENZA VACCINE  Discontinued  . PNA vac Low Risk Adult  Discontinued    Plan:    Please schedule your next medicare wellness visit with me in 1  yr.  Continue to eat heart healthy diet (full of fruits, vegetables, whole grains, lean protein, water--limit salt, fat, and sugar intake) and increase physical activity as tolerated.  Continue doing brain stimulating activities (puzzles, reading, adult coloring books, staying active) to keep memory sharp.   Bring a copy of your living will and/or healthcare power of attorney to your next office visit.   I have personally reviewed and noted the following in the patient's chart:   . Medical and social history . Use of alcohol, tobacco or illicit drugs  . Current medications and supplements . Functional ability and status . Nutritional status . Physical activity . Advanced directives . List of other physicians . Hospitalizations, surgeries, and ER visits in previous 12 months . Vitals . Screenings to include cognitive, depression, and falls . Referrals and appointments  In addition, I have reviewed and discussed with patient certain preventive protocols, quality metrics, and best practice recommendations. A written personalized care plan for preventive services as well as general preventive health recommendations were provided to patient.     [place]on G[place], [place]   03/03/2020

## 2020-03-03 ENCOUNTER — Ambulatory Visit (INDEPENDENT_AMBULATORY_CARE_PROVIDER_SITE_OTHER): Payer: Medicare Other | Admitting: *Deleted

## 2020-03-03 ENCOUNTER — Encounter: Payer: Self-pay | Admitting: *Deleted

## 2020-03-03 ENCOUNTER — Other Ambulatory Visit: Payer: Self-pay

## 2020-03-03 DIAGNOSIS — Z0000 Encounter for general adult medical examination without abnormal findings: Secondary | ICD-10-CM | POA: Diagnosis not present

## 2020-03-03 NOTE — Patient Instructions (Signed)
Please schedule your next medicare wellness visit with me in 1 yr.  Continue to eat heart healthy diet (full of fruits, vegetables, whole grains, lean protein, water--limit salt, fat, and sugar intake) and increase physical activity as tolerated.  Continue doing brain stimulating activities (puzzles, reading, adult coloring books, staying active) to keep memory sharp.   Bring a copy of your living will and/or healthcare power of attorney to your next office visit.   Holly Herring , Thank you for taking time to come for your Medicare Wellness Visit. I appreciate your ongoing commitment to your health goals. Please review the following plan we discussed and let me know if I can assist you in the future.   These are the goals we discussed: Goals    . Maintain health and independence       This is a list of the screening recommended for you and due dates:  Health Maintenance  Topic Date Due  . Tetanus Vaccine  Never done  . Mammogram  09/21/2020  . Colon Cancer Screening  09/21/2028  . DEXA scan (bone density measurement)  Completed  .  Hepatitis C: One time screening is recommended by Center for Disease Control  (CDC) for  adults born from [birth year] through 1963.   Completed  . Flu Shot  Discontinued  . Pneumonia vaccines  Discontinued  ' Preventive Care 48 Years and Older, Female Preventive care refers to lifestyle choices and visits with your health care provider that can promote health and wellness. This includes:  A yearly physical exam. This is also called an annual well check.  Regular dental and eye exams.  Immunizations.  Screening for certain conditions.  Healthy lifestyle choices, such as diet and exercise. What can I expect for my preventive care visit? Physical exam Your health care provider will check:  Height and weight. These may be used to calculate body mass index (BMI), which is a measurement that tells if you are at a healthy weight.  Heart rate and blood  pressure.  Your skin for abnormal spots. Counseling Your health care provider may ask you questions about:  Alcohol, tobacco, and drug use.  Emotional well-being.  Home and relationship well-being.  Sexual activity.  Eating habits.  History of falls.  Memory and ability to understand (cognition).  Work and work Statistician.  Pregnancy and menstrual history. What immunizations do I need?  Influenza (flu) vaccine  This is recommended every year. Tetanus, diphtheria, and pertussis (Tdap) vaccine  You may need a Td booster every 10 years. Varicella (chickenpox) vaccine  You may need this vaccine if you have not already been vaccinated. Zoster (shingles) vaccine  You may need this after age 68. Pneumococcal conjugate (PCV13) vaccine  One dose is recommended after age 23. Pneumococcal polysaccharide (PPSV23) vaccine  One dose is recommended after age 17. Measles, mumps, and rubella (MMR) vaccine  You may need at least one dose of MMR if you were born in 1957 or later. You may also need a second dose. Meningococcal conjugate (MenACWY) vaccine  You may need this if you have certain conditions. Hepatitis A vaccine  You may need this if you have certain conditions or if you travel or work in places where you may be exposed to hepatitis A. Hepatitis B vaccine  You may need this if you have certain conditions or if you travel or work in places where you may be exposed to hepatitis B. Haemophilus influenzae type b (Hib) vaccine  You may need this  if you have certain conditions. You may receive vaccines as individual doses or as more than one vaccine together in one shot (combination vaccines). Talk with your health care provider about the risks and benefits of combination vaccines. What tests do I need? Blood tests  Lipid and cholesterol levels. These may be checked every 5 years, or more frequently depending on your overall health.  Hepatitis C test.  Hepatitis B  test. Screening  Lung cancer screening. You may have this screening every year starting at age 34 if you have a 30-pack-year history of smoking and currently smoke or have quit within the past 15 years.  Colorectal cancer screening. All adults should have this screening starting at age 30 and continuing until age 68. Your health care provider may recommend screening at age 33 if you are at increased risk. You will have tests every 1-10 years, depending on your results and the type of screening test.  Diabetes screening. This is done by checking your blood sugar (glucose) after you have not eaten for a while (fasting). You may have this done every 1-3 years.  Mammogram. This may be done every 1-2 years. Talk with your health care provider about how often you should have regular mammograms.  BRCA-related cancer screening. This may be done if you have a family history of breast, ovarian, tubal, or peritoneal cancers. Other tests  Sexually transmitted disease (STD) testing.  Bone density scan. This is done to screen for osteoporosis. You may have this done starting at age 29. Follow these instructions at home: Eating and drinking  Eat a diet that includes fresh fruits and vegetables, whole grains, lean protein, and low-fat dairy products. Limit your intake of foods with high amounts of sugar, saturated fats, and salt.  Take vitamin and mineral supplements as recommended by your health care provider.  Do not drink alcohol if your health care provider tells you not to drink.  If you drink alcohol: ? Limit how much you have to 0-1 drink a day. ? Be aware of how much alcohol is in your drink. In the [place] one drink equals one 12 oz bottle of beer (355 mL), one 5 oz glass of wine (148 mL), or one 1 oz glass of hard liquor (44 mL). Lifestyle  Take daily care of your teeth and gums.  Stay active. Exercise for at least 30 minutes on 5 or more days each week.  Do not use any products that  contain nicotine or tobacco, such as cigarettes, e-cigarettes, and chewing tobacco. If you need help quitting, ask your health care provider.  If you are sexually active, practice safe sex. Use a condom or other form of protection in order to prevent STIs (sexually transmitted infections).  Talk with your health care provider about taking a low-dose aspirin or statin. What's next?  Go to your health care provider once a year for a well check visit.  Ask your health care provider how often you should have your eyes and teeth checked.  Stay up to date on all vaccines. This information is not intended to replace advice given to you by your health care provider. Make sure you discuss any questions you have with your health care provider. Document Revised: 11/06/2018 Document Reviewed: 11/06/2018 Elsevier Patient Education  2020 [employer].

## 2020-03-22 ENCOUNTER — Encounter: Payer: Self-pay | Admitting: Family Medicine

## 2020-03-22 ENCOUNTER — Ambulatory Visit (INDEPENDENT_AMBULATORY_CARE_PROVIDER_SITE_OTHER): Payer: Medicare Other | Admitting: Family Medicine

## 2020-03-22 ENCOUNTER — Other Ambulatory Visit: Payer: Self-pay

## 2020-03-22 VITALS — BP 136/78 | HR 77 | Temp 95.7°F | Ht 61.0 in | Wt 208.4 lb

## 2020-03-22 DIAGNOSIS — I10 Essential (primary) hypertension: Secondary | ICD-10-CM | POA: Diagnosis not present

## 2020-03-22 NOTE — Patient Instructions (Addendum)
Keep the diet clean and stay active.  I am OK with checking blood pressures as much or as little as you want.   Aim to do some physical exertion for 150 minutes per week. This is typically divided into 5 days per week, 30 minutes per day. The activity should be enough to get your heart rate up. Anything is better than nothing if you have time constraints. Consider yoga and weight resistance exercise.  Let us know if you need anything.

## 2020-03-22 NOTE — Progress Notes (Signed)
Chief Complaint  Patient presents with  . Follow-up    Subjective Holly Herring is a 69 y.o. female who presents for hypertension follow up. She does not monitor home blood pressures. She is compliant with medication- Prinzide 10-12.5 mg/d. Patient has these side effects of medication: none She is adhering to a healthy diet overall. Current exercise: walking    Past Medical History:  Diagnosis Date  . Arthritis   . Hypertension     Exam BP 140/82 (BP Location: Left Arm, Patient Position: Sitting, Cuff Size: Normal)   Pulse 77   Temp (!) 95.7 F (35.4 C) (Temporal)   Ht 5\' 1"  (1.549 m)   Wt 208 lb 6 oz (94.5 kg)   SpO2 95%   BMI 39.37 kg/m  General:  well developed, well nourished, in no apparent distress Heart: RRR, no bruits, no LE edema Lungs: clear to auscultation, no accessory muscle use Psych: well oriented with normal range of affect and appropriate judgment/insight  Essential hypertension  Cont Prinzide. Counseled on diet and exercise. F/u in in 6 mo for CPE. The patient voiced understanding and agreement to the plan.  [place], DO 03/22/20  8:57 AM

## 2020-04-19 DIAGNOSIS — Z4689 Encounter for fitting and adjustment of other specified devices: Secondary | ICD-10-CM | POA: Diagnosis not present

## 2020-05-26 ENCOUNTER — Other Ambulatory Visit: Payer: Self-pay | Admitting: Family Medicine

## 2020-07-27 ENCOUNTER — Ambulatory Visit (INDEPENDENT_AMBULATORY_CARE_PROVIDER_SITE_OTHER): Payer: Medicare Other | Admitting: Obstetrics & Gynecology

## 2020-07-27 ENCOUNTER — Encounter: Payer: Self-pay | Admitting: Obstetrics & Gynecology

## 2020-07-27 ENCOUNTER — Other Ambulatory Visit: Payer: Self-pay

## 2020-07-27 VITALS — BP 169/71 | HR 85 | Wt 208.0 lb

## 2020-07-27 DIAGNOSIS — N814 Uterovaginal prolapse, unspecified: Secondary | ICD-10-CM | POA: Diagnosis not present

## 2020-07-27 DIAGNOSIS — N8182 Incompetence or weakening of pubocervical tissue: Secondary | ICD-10-CM | POA: Diagnosis not present

## 2020-07-27 NOTE — Progress Notes (Signed)
  HPI Pt presents today to have pessary removed and cleaned. She was seen by Dr. Ashley Royalty who felt that she was not a good surgical candidate as she cont to assist with moving her husband and this would further exacerbate her issue. She placed a pessary that pt says is working well. She denies problems and reports that her pessary is working very well.  She denies further complaints.    Review of Systems: Pts husband is still ambulatory but, having problems. Still functional with some ADLs. He is 69 year old today.         Objective:   Physical Exam BP (!) 169/71   Pulse 85   Wt 208 lb (94.3 kg)   BMI 39.30 kg/m    CONSTITUTIONAL: Well-developed, well-nourished female in no acute distress.  HENT:  Normocephalic, atraumatic EYES: Conjunctivae and EOM are normal. No scleral icterus.  NECK: Normal range of motion SKIN: Skin is warm and dry. No rash noted. Not diaphoretic.No pallor. NEUROLGIC: Alert and oriented to person, place, and time. Normal coordination.   GYN: Pessary removed and cleaned; cervix and vaginal mucosa looks healty and with no excoriations or breakdown     Assessment:     Cervical prolapse (pt is s/p supracervical hyst) Pessary check     Plan:     F/u in 3 months to have pessary removed and cleaned F/u sooner prn  Records reviewed from Dr. Ashley Royalty.   Total face-to-face time with patient was 30 min.  Greater than 50% was spent in counseling and coordination of care with the patient.   Dianah Pruett L. Harraway-Smith, M.D., [place]

## 2020-08-09 DIAGNOSIS — H2513 Age-related nuclear cataract, bilateral: Secondary | ICD-10-CM | POA: Diagnosis not present

## 2020-08-09 DIAGNOSIS — H25013 Cortical age-related cataract, bilateral: Secondary | ICD-10-CM | POA: Diagnosis not present

## 2020-08-09 DIAGNOSIS — H04123 Dry eye syndrome of bilateral lacrimal glands: Secondary | ICD-10-CM | POA: Diagnosis not present

## 2020-08-09 DIAGNOSIS — H524 Presbyopia: Secondary | ICD-10-CM | POA: Diagnosis not present

## 2020-09-15 DIAGNOSIS — M1711 Unilateral primary osteoarthritis, right knee: Secondary | ICD-10-CM | POA: Diagnosis not present

## 2020-09-20 ENCOUNTER — Other Ambulatory Visit: Payer: Self-pay

## 2020-09-20 ENCOUNTER — Encounter: Payer: Self-pay | Admitting: Family Medicine

## 2020-09-20 ENCOUNTER — Ambulatory Visit (INDEPENDENT_AMBULATORY_CARE_PROVIDER_SITE_OTHER): Payer: Medicare Other | Admitting: Family Medicine

## 2020-09-20 VITALS — BP 128/82 | HR 80 | Temp 98.3°F | Ht 61.0 in | Wt 205.0 lb

## 2020-09-20 DIAGNOSIS — Z0000 Encounter for general adult medical examination without abnormal findings: Secondary | ICD-10-CM | POA: Diagnosis not present

## 2020-09-20 DIAGNOSIS — E7800 Pure hypercholesterolemia, unspecified: Secondary | ICD-10-CM

## 2020-09-20 NOTE — Progress Notes (Signed)
Chief Complaint  Patient presents with  . Follow-up     Well Woman Holly Herring is here for a complete physical.   Her last physical was >1 year ago.  Current diet: in general, a "healthy" diet. Current exercise: some weight resistance. Weight is stable and she denies daytime fatigue. Seatbelt? Yes  Health Maintenance Colonoscopy- Yes Shingrix- No DEXA- Yes Mammogram- Yes Tetanus- No Pneumonia- Yes Hep C screen- Yes  Past Medical History:  Diagnosis Date  . Arthritis   . Hypertension      Past Surgical History:  Procedure Laterality Date  . ABDOMINAL HYSTERECTOMY  2019  . BREAST SURGERY Bilateral    breast reduction  . HERNIA REPAIR    . KNEE ARTHROSCOPY     Dr Hollice Gong  . OOPHORECTOMY     one ovary removed due to ectopic  . REDUCTION MAMMAPLASTY Bilateral 2002  . 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 aspirin EC 81 MG tablet Take 81 mg by mouth daily.    . calcium carbonate (CALCIUM 600) 600 MG TABS tablet Take 600 mg by mouth daily with breakfast.    . estradiol (ESTRACE) 1 MG tablet Take 1 tablet (1 mg total) by mouth daily. 90 tablet 3  . lisinopril-hydrochlorothiazide (ZESTORETIC) 10-12.5 MG tablet Take 1 tablet by mouth daily. 90 tablet 1  . Magnesium 500 MG CAPS Take 1 capsule by mouth daily.    . meloxicam (MOBIC) 7.5 MG tablet Take 7.5 mg by mouth 2 (two) times daily.    . Omega-3 Fatty Acids (FISH OIL) 1000 MG CAPS Take 1 capsule by mouth daily.    . traMADol (ULTRAM) 50 MG tablet Take by mouth every 6 (six) hours as needed.    . vitamin B-12 (CYANOCOBALAMIN) 1000 MCG tablet Take 1,000 mcg by mouth daily.    . vitamin C (ASCORBIC ACID) 500 MG tablet Take 500 mg by mouth daily.    . Zinc 50 MG CAPS Take 1  capsule by mouth daily.     Allergies Allergies  Allergen Reactions  . Other     NO BLOOD OR BLOOD PRODUCTS  . Penicillins Hives  . Percocet [Oxycodone-Acetaminophen] Itching    Has to take benadryl prior    Review of Systems: Constitutional:  no fevers Eye:  no recent significant change in vision Ears:  No changes in hearing Nose/Mouth/Throat:  no complaints of nasal congestion, no sore throat Cardiovascular: no chest pain Respiratory:  No shortness of breath Gastrointestinal:  No change in bowel habits GU:  Female: negative for dysuria Integumentary:  no abnormal skin lesions reported Neurologic:  no headaches Endocrine:  denies unexplained weight changes  Exam BP 128/82 (BP Location: Left Arm, Patient Position: Sitting, Cuff Size: Large)   Pulse 80   Temp 98.3 F (36.8 C) (Oral)   Ht 5\' 1"  (1.549 m)   Wt 205 lb (93 kg)   SpO2 96%   BMI 38.73 kg/m  General:  well developed, well nourished, in no apparent distress Skin:  no significant moles, warts, or growths Head:  no masses, lesions, or tenderness Eyes:  pupils equal and round, sclera anicteric without injection Ears:  canals without lesions, TMs shiny without retraction, no obvious effusion, no erythema Nose:  nares patent,  septum midline, mucosa normal, and no drainage or sinus tenderness Throat/Pharynx:  lips and gingiva without lesion; tongue and uvula midline; non-inflamed pharynx; no exudates or postnasal drainage Neck: neck supple without adenopathy, thyromegaly, or masses Lungs:  clear to auscultation, breath sounds equal bilaterally, no respiratory distress Cardio:  regular rate and rhythm, no bruits or LE edema Abdomen:  abdomen soft, nontender; bowel sounds normal; no masses or organomegaly Genital: Deferred Neuro:  gait normal; deep tendon reflexes normal and symmetric Psych: well oriented with normal range of affect and appropriate judgment/insight  Assessment and Plan  Well adult exam  Pure  hypercholesterolemia - Plan: Lipid panel, Comprehensive metabolic panel   Well 69 y.o. female. Counseled on diet and exercise. Shingrix info given.  Other orders as above. Follow up in 6 mo or prn. The patient voiced understanding and agreement to the plan.  Holly Roche [place], DO 09/20/20 8:11 AM

## 2020-09-20 NOTE — Patient Instructions (Addendum)
Give us 2-3 business days to get the results of your labs back.   Keep the diet clean and stay active.  The new Shingrix vaccine (for shingles) is a 2 shot series. It can make people feel low energy, achy and almost like they have the flu for 48 hours after injection. Please plan accordingly when deciding on when to get this shot. Call the pharmacy for an appointment to get this. The second shot of the series is less severe regarding the side effects, but it still lasts 48 hours.   Let us know if you need anything. 

## 2020-09-21 LAB — COMPREHENSIVE METABOLIC PANEL
AG Ratio: 1.2 (calc) (ref 1.0–2.5)
ALT: 9 U/L (ref 6–29)
AST: 17 U/L (ref 10–35)
Albumin: 3.9 g/dL (ref 3.6–5.1)
Alkaline phosphatase (APISO): 44 U/L (ref 37–153)
BUN: 15 mg/dL (ref 7–25)
CO2: 30 mmol/L (ref 20–32)
Calcium: 10 mg/dL (ref 8.6–10.4)
Chloride: 102 mmol/L (ref 98–110)
Creat: 0.88 mg/dL (ref 0.50–0.99)
Globulin: 3.2 g/dL (calc) (ref 1.9–3.7)
Glucose, Bld: 83 mg/dL (ref 65–99)
Potassium: 4.3 mmol/L (ref 3.5–5.3)
Sodium: 138 mmol/L (ref 135–146)
Total Bilirubin: 0.4 mg/dL (ref 0.2–1.2)
Total Protein: 7.1 g/dL (ref 6.1–8.1)

## 2020-09-21 LAB — LIPID PANEL
Cholesterol: 238 mg/dL — ABNORMAL HIGH (ref <200)
HDL: 103 mg/dL (ref >=50)
LDL Cholesterol (Calc): 119 mg/dL (calc) — ABNORMAL HIGH
Non-HDL Cholesterol (Calc): 135 mg/dL (calc) — ABNORMAL HIGH (ref <130)
Total CHOL/HDL Ratio: 2.3 (calc) (ref <5.0)
Triglycerides: 69 mg/dL (ref <150)

## 2020-11-10 DIAGNOSIS — M1711 Unilateral primary osteoarthritis, right knee: Secondary | ICD-10-CM | POA: Diagnosis not present

## 2020-11-13 ENCOUNTER — Other Ambulatory Visit: Payer: Self-pay | Admitting: Family Medicine

## 2020-11-14 ENCOUNTER — Other Ambulatory Visit: Payer: Self-pay

## 2020-11-14 DIAGNOSIS — N952 Postmenopausal atrophic vaginitis: Secondary | ICD-10-CM

## 2020-11-14 MED ORDER — ESTRADIOL 1 MG PO TABS: 1.0000 mg | ORAL_TABLET | Freq: Every day | ORAL | 3 refills | Status: DC

## 2020-11-16 ENCOUNTER — Telehealth: Payer: Self-pay

## 2020-11-16 NOTE — Telephone Encounter (Signed)
Caller states she missed a call from the office today concerning an upcoming surgery. Telephone: [phone]

## 2020-11-16 NOTE — Telephone Encounter (Signed)
Called back left message to schedule Pre-Op appt for her upcoming surgery

## 2020-11-16 NOTE — Telephone Encounter (Signed)
Appt scheduled 11/29/2020.

## 2020-11-24 ENCOUNTER — Encounter: Payer: Self-pay | Admitting: Obstetrics & Gynecology

## 2020-11-24 ENCOUNTER — Ambulatory Visit (INDEPENDENT_AMBULATORY_CARE_PROVIDER_SITE_OTHER): Payer: Medicare Other | Admitting: Obstetrics & Gynecology

## 2020-11-24 ENCOUNTER — Other Ambulatory Visit: Payer: Self-pay

## 2020-11-24 VITALS — BP 157/84 | HR 82 | Ht 61.0 in | Wt 198.0 lb

## 2020-11-24 DIAGNOSIS — N8185 Cervical stump prolapse: Secondary | ICD-10-CM

## 2020-11-24 NOTE — Progress Notes (Signed)
°  HPI Pt presents today to have pessary removed and cleaned.  She denies problems and reports that her pessary is working very well.  She denies further complaints.  She plans to have knee replacement surgery in Feb.   Review of Systems: non contributory.        Objective:   Physical Exam BP (!) 171/83    Pulse 87    Ht 5\' 1"  (1.549 m)    Wt 198 lb (89.8 kg)    BMI 37.41 kg/m   CONSTITUTIONAL: Well-developed, well-nourished female in no acute distress.  HENT:  Normocephalic, atraumatic EYES: Conjunctivae and EOM are normal. No scleral icterus.  NECK: Normal range of motion SKIN: Skin is warm and dry. No rash noted. Not diaphoretic.No pallor. NEUROLGIC: Alert and oriented to person, place, and time. Normal coordination.   GYN: Pessary removed and cleaned; vaginal mucosa looks healty and with no excoriations or breakdown     Assessment:     Pessary check- doing well. Questions answered about Pessary the foley intraop.     Plan:     F/u in 3 months to have pessary removed and cleaned F/u sooner prn   Sahil Milner L. Harraway-Smith, M.D., 

## 2020-11-29 ENCOUNTER — Other Ambulatory Visit: Payer: Self-pay

## 2020-11-29 ENCOUNTER — Ambulatory Visit (INDEPENDENT_AMBULATORY_CARE_PROVIDER_SITE_OTHER): Payer: Medicare Other | Admitting: Family Medicine

## 2020-11-29 ENCOUNTER — Encounter: Payer: Self-pay | Admitting: Family Medicine

## 2020-11-29 ENCOUNTER — Telehealth: Payer: Self-pay | Admitting: Family Medicine

## 2020-11-29 VITALS — BP 136/88 | HR 92 | Temp 98.6°F | Ht 61.0 in | Wt 198.5 lb

## 2020-11-29 DIAGNOSIS — Z01818 Encounter for other preprocedural examination: Secondary | ICD-10-CM | POA: Diagnosis not present

## 2020-11-29 LAB — COMPREHENSIVE METABOLIC PANEL
ALT: 12 U/L (ref 0–35)
AST: 18 U/L (ref 0–37)
Albumin: 4.4 g/dL (ref 3.5–5.2)
Alkaline Phosphatase: 41 U/L (ref 39–117)
BUN: 14 mg/dL (ref 6–23)
CO2: 30 mEq/L (ref 19–32)
Calcium: 10.2 mg/dL (ref 8.4–10.5)
Chloride: 101 mEq/L (ref 96–112)
Creatinine, Ser: 0.82 mg/dL (ref 0.40–1.20)
GFR: 73.76 mL/min (ref >60.00)
Glucose, Bld: 85 mg/dL (ref 70–99)
Potassium: 4.3 mEq/L (ref 3.5–5.1)
Sodium: 138 mEq/L (ref 135–145)
Total Bilirubin: 0.4 mg/dL (ref 0.2–1.2)
Total Protein: 7.4 g/dL (ref 6.0–8.3)

## 2020-11-29 LAB — CBC
HCT: 39.9 % (ref 36.0–46.0)
Hemoglobin: 12.9 g/dL (ref 12.0–15.0)
MCHC: 32.4 g/dL (ref 30.0–36.0)
MCV: 85.6 fl (ref 78.0–100.0)
Platelets: 323 10*3/uL (ref 150.0–400.0)
RBC: 4.66 Mil/uL (ref 3.87–5.11)
RDW: 14.9 % (ref 11.5–15.5)
WBC: 5.5 10*3/uL (ref 4.0–10.5)

## 2020-11-29 LAB — HEMOGLOBIN A1C: Hgb A1c MFr Bld: 5.8 % (ref 4.6–6.5)

## 2020-11-29 NOTE — Telephone Encounter (Signed)
[employer] conservator/restorer

## 2020-11-29 NOTE — Patient Instructions (Signed)
Give [place] 2-3 business days to get the results of your labs back.   If labs are normal, we will complete the form and there should be no issues.   Let us know if you need anything.

## 2020-11-29 NOTE — Progress Notes (Signed)
Subjective:   Chief Complaint  Patient presents with  . Follow-up    Holly Herring  is here for a Pre-operative physical at the request of Dr. Despina Hick.   She  is having total R knee arthroplasty surgery on 2/14 for OA.  Personal or family hx of adverse outcome to anesthesia? No  Chipped, cracked, missing, or loose teeth? Missing teeth upper R premolar and L canine  Decreased ROM of neck? No  Able to walk up 2 flights of stairs without becoming significantly short of breath or having chest pain? Yes  Revised Goldman Criteria: High Risk Surgery (intraperitoneal, intrathoracic, aortic): No  Ischemic heart disease (Prior MI, +excercise stress test, angina, nitrate use, Qwave): No  History of heart failure: No  History of cerebrovascular disease: No  History of diabetes: No  Insulin therapy for DM: No  Preoperative Cr >2.0: No   Patient Active Problem List   Diagnosis Date Noted  . Preventive measure 01/25/2016  . Numbness and tingling in hands 01/18/2015  . Former smoker, stopped smoking in distant past 01/18/2015  . Hypertension 12/26/2011  . Postmenopausal status, on HRT 12/26/2011  . Knee pain, s/p TKR LEFT 12/26/2011   Past Medical History:  Diagnosis Date  . Arthritis   . Hypertension     Past Surgical History:  Procedure Laterality Date  . ABDOMINAL HYSTERECTOMY  2019  . BREAST SURGERY Bilateral    breast reduction  . HERNIA REPAIR    . KNEE ARTHROSCOPY     Dr Hollice Gong  . OOPHORECTOMY     one ovary removed due to ectopic  . REDUCTION MAMMAPLASTY Bilateral 2002  . 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   Procedure: TOTAL KNEE ARTHROPLASTY;  Surgeon: Nestor Lewandowsky, MD;  Location: [facility] OR;  Service: Orthopedics;  Laterality: Left;  DEPUY/SIGMA    Current Outpatient Medications  Medication Sig Dispense Refill  . aspirin EC 81 MG tablet Take 81 mg by mouth daily.    . calcium carbonate (OS-CAL) 600 MG TABS tablet Take 600 mg by mouth daily with breakfast.    . estradiol  (ESTRACE) 1 MG tablet Take 1 tablet (1 mg total) by mouth daily. 90 tablet 3  . lisinopril-hydrochlorothiazide (ZESTORETIC) 10-12.5 MG tablet Take 1 tablet by mouth once daily 90 tablet 0  . Magnesium 500 MG CAPS Take 1 capsule by mouth daily.    . Omega-3 Fatty Acids (FISH OIL) 1000 MG CAPS Take 1 capsule by mouth daily.    . vitamin B-12 (CYANOCOBALAMIN) 1000 MCG tablet Take 1,000 mcg by mouth daily.    . vitamin C (ASCORBIC ACID) 500 MG tablet Take 500 mg by mouth daily.    . Zinc 50 MG CAPS Take 1 capsule by mouth daily.     Allergies  Allergen Reactions  . Other     NO BLOOD OR BLOOD PRODUCTS  . Penicillins Hives  . Percocet [Oxycodone-Acetaminophen] Itching    Has to take benadryl prior    Family History  Problem Relation Age of Onset  . Heart disease Mother   . Heart disease Brother      Review of Systems:  Constitutional:  no fevers Eye:  no recent significant change in vision Ear:  no hearing loss Nose/Mouth/Throat:  No dental complaints Neck/Thyroid:  no lumps or masses Pulmonary:  No shortness of breath Cardiovascular:  no chest pain Gastrointestinal:  no abdominal pain GU:  negative for dysuria Musculoskeletal/Extremities: +R knee pain Skin/Integumentary ROS:  no abnormal skin lesions reported Neurologic:  no HA   Objective:   Vitals:   11/29/20 0747  BP: 136/88  Pulse: 92  Temp: 98.6 F (37 C)  TempSrc: Oral  SpO2: 98%  Weight: 198 lb 8 oz (90 kg)  Height: 5\' 1"  (1.549 m)   Body mass index is 37.51 kg/m.  General:  well developed, well nourished, in no apparent distress Skin:  warm, no pallor or diaphoresis Head:  normocephalic, atraumatic Eyes:  pupils equal and round, sclera anicteric without injection Ears:  canals without lesions, TMs shiny without retraction, no obvious effusion, no erythema Throat/Pharynx:  lips and gingiva without lesion; tongue and uvula midline; non-inflamed pharynx; no exudates or postnasal drainage; is missing mand  premolar on R and L, maxillary premolar on the L Neck: neck supple without adenopathy, thyromegaly, or masses, no bruits, no jugular venous distention Lungs:  clear to auscultation, breath sounds equal bilaterally, no respiratory distress Cardio:  regular rate and rhythm without murmurs Abdomen:  abdomen soft, nontender; bowel sounds normal; no masses, hepatomegaly or splenomegaly Musculoskeletal:  symmetrical muscle groups noted without atrophy or deformity Extremities:  no clubbing, cyanosis, or edema, no deformities, no skin discoloration Neuro:  Gait antalgic; deep tendon reflexes normal and symmetric and alert and oriented to person, place, and time Psych: Age appropriate judgment and insight; normal mood   Assessment:   Preop examination - Plan: CBC, Comprehensive metabolic panel, Hemoglobin A1c   Plan:   Orders as above. The above laboratory work was ordered and will be sent with this physical. Pending the above workup, the patient is deemed low cardiac risk for the proposed procedure.  The patient voiced understanding and agreement to the plan.  [place], DO 11/29/20  8:51 AM

## 2020-11-29 NOTE — Telephone Encounter (Signed)
Patient called in reference to chart answers . Patient states she read her chart after leaving the office and noticed that one of the questions was marked YES. Patient states she does not have any trouble walking up two flights of stairs no chest pain or shortness of breath.   Please Advise

## 2020-12-27 NOTE — H&P (Signed)
TOTAL KNEE ADMISSION H&P  Patient is being admitted for right total knee arthroplasty.  Subjective:  Chief Complaint: Right knee pain.  HPI: Holly Herring, 70 y.o. female has a history of pain and functional disability in the right knee due to trauma and has failed non-surgical conservative treatments for greater than 12 weeks to include NSAID's and/or analgesics and activity modification. Onset of symptoms was gradual, starting several years ago with gradually worsening course since that time. The patient noted no past surgery on the right knee.  Patient currently rates pain in the right knee at 5 out of 10 with activity. Patient has worsening of pain with activity and weight bearing and pain that interferes with activities of daily living. Patient has evidence of joint space narrowing by imaging studies. There is no active infection.  Patient Active Problem List   Diagnosis Date Noted  . Preventive measure 01/25/2016  . Numbness and tingling in hands 01/18/2015  . Former smoker, stopped smoking in distant past 01/18/2015  . Hypertension 12/26/2011  . Postmenopausal status, on HRT 12/26/2011  . Knee pain, s/p TKR LEFT 12/26/2011    Past Medical History:  Diagnosis Date  . Arthritis   . Hypertension     Past Surgical History:  Procedure Laterality Date  . ABDOMINAL HYSTERECTOMY  2019  . BREAST SURGERY Bilateral    breast reduction  . HERNIA REPAIR    . KNEE ARTHROSCOPY     Dr Hollice Gong  . OOPHORECTOMY     one ovary removed due to ectopic  . REDUCTION MAMMAPLASTY Bilateral 2002  . 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   Procedure: TOTAL KNEE ARTHROPLASTY;  Surgeon: Nestor Lewandowsky, MD;  Location: [facility] OR;  Service: Orthopedics;  Laterality: Left;  DEPUY/SIGMA    Prior to Admission medications   Medication Sig Start Date End Date Taking? Authorizing Provider  aspirin EC 81 MG tablet Take 81 mg by mouth daily.   Yes [provider]  calcium carbonate (OS-CAL) 600 MG  TABS tablet Take 600 mg by mouth daily with breakfast.   Yes [provider]  diphenhydrAMINE (SLEEP AID, DIPHENHYDRAMINE,) 25 MG tablet Take 25 mg by mouth at bedtime as needed for sleep.   Yes [provider]  estradiol (ESTRACE) 0.1 MG/GM vaginal cream Place 1 Applicatorful vaginally every other day. 07/10/20  Yes [provider]  estradiol (ESTRACE) 1 MG tablet Take 1 tablet (1 mg total) by mouth daily. 11/14/20  Yes Willodean Rosenthal, MD  lisinopril-hydrochlorothiazide (ZESTORETIC) 10-12.5 MG tablet Take 1 tablet by mouth once daily 11/14/20  Yes Wendling, [name], DO  Magnesium 500 MG CAPS Take 500 mg by mouth daily.   Yes [provider]  naproxen sodium (ALEVE) 220 MG tablet Take 660 mg by mouth daily.   Yes [provider]  Omega-3 Fatty Acids (FISH OIL) 1000 MG CAPS Take 1,000 mg by mouth daily.   Yes [provider]  vitamin B-12 (CYANOCOBALAMIN) 1000 MCG tablet Take 1,000 mcg by mouth daily.   Yes [provider]  vitamin C (ASCORBIC ACID) 500 MG tablet Take 500 mg by mouth daily.   Yes [provider]  Zinc 50 MG CAPS Take 50 mg by mouth daily.   Yes [provider]    Allergies  Allergen Reactions  . Codeine Itching    Can't sleep   . Other     NO BLOOD OR BLOOD PRODUCTS  . Penicillins Hives  . Percocet [Oxycodone-Acetaminophen] Itching  Has to take benadryl prior    Social History   Socioeconomic History  . Marital status: Married    Spouse name: Not on file  . Number of children: Not on file  . Years of education: Not on file  . Highest education level: Not on file  Occupational History  . Not on file  Tobacco Use  . Smoking status: Former Smoker    Quit date: 11/26/1978    Years since quitting: 42.1  . Smokeless tobacco: Never Used  Vaping Use  . Vaping Use: Never used  Substance and Sexual Activity  . Alcohol use: Yes    Alcohol/week: 1.0 standard drink    Types: 1  Glasses of wine per week    Comment: occasionally  . Drug use: No  . Sexual activity: Not on file  Other Topics Concern  . Not on file  Social History Narrative  . Not on file   Social Determinants of Health   Financial Resource Strain: Low Risk   . Difficulty of Paying Living Expenses: Not hard at all  Food Insecurity: No Food Insecurity  . Worried About Programme researcher, broadcasting/film/video in the Last Year: Never true  . Ran Out of Food in the Last Year: Never true  Transportation Needs: No Transportation Needs  . Lack of Transportation (Medical): No  . Lack of Transportation (Non-Medical): No  Physical Activity: Not on file  Stress: Not on file  Social Connections: Not on file  Intimate Partner Violence: Not on file    Tobacco Use: Medium Risk  . Smoking Tobacco Use: Former Smoker  . Smokeless Tobacco Use: Never Used   Social History   Substance and Sexual Activity  Alcohol Use Yes  . Alcohol/week: 1.0 standard drink  . Types: 1 Glasses of wine per week   Comment: occasionally    Family History  Problem Relation Age of Onset  . Heart disease Mother   . Heart disease Brother     Review of Systems  Constitutional: Negative for chills, fever and weight loss.  Respiratory: Negative for cough, shortness of breath and wheezing.   Cardiovascular: Negative for chest pain and palpitations.  Gastrointestinal: Negative for diarrhea, nausea and vomiting.  Musculoskeletal: Positive for joint pain.  Neurological: Negative for dizziness and headaches.    Objective:  Physical Exam: Well nourished and well developed.  General: Alert and oriented x3, cooperative and pleasant, no acute distress.  Head: normocephalic, atraumatic, neck supple.  Eyes: EOMI.  Respiratory: breath sounds clear in all fields, no wheezing, rales, or rhonchi. Cardiovascular: Regular rate and rhythm, no murmurs, gallops or rubs.  Abdomen: non-tender to palpation and soft, normoactive bowel  sounds. Musculoskeletal:  Right Knee Exam:  No effusion.  Varus deformity.  Range of motion is 5-90 degrees.  Marked crepitus on range of motion of the knee.  Positive medial greater than lateral joint line tenderness.  Stable knee.    Left Knee Exam:  Well-healed scar from previous arthroplasty.  No effusion.  Range of motion is 0-95 degrees.  No crepitus on range of motion of the knee.  No medial joint line tenderness.  No lateral joint line tenderness.  Stable knee.    The patient's sensation and motor function are intact in their lower extremities. Their distal pulses are 2+. The bilateral calves are soft and non-tender.   Vital signs in last 24 hours: BP: ()/()  Arterial Line BP: ()/()   Imaging Review AP and lateral x-rays of the bilateral knees  dated 09/15/20 demonstrate a prosthesis in excellent position with no periprosthetic abnormalities on the left. On the right, she has bone-on-bone medial patellofemoral varus deformity, tibial subluxation.   Assessment/Plan:  End stage arthritis, right knee   The patient history, physical examination, clinical judgment of the provider and imaging studies are consistent with end stage degenerative joint disease of the right knee and total knee arthroplasty is deemed medically necessary. The treatment options including medical management, injection therapy arthroscopy and arthroplasty were discussed at length. The risks and benefits of total knee arthroplasty were presented and reviewed. The risks due to aseptic loosening, infection, stiffness, patella tracking problems, thromboembolic complications and other imponderables were discussed. The patient acknowledged the explanation, agreed to proceed with the plan and consent was signed. Patient is being admitted for inpatient treatment for surgery, pain control, PT, OT, prophylactic antibiotics, VTE prophylaxis, progressive ambulation and ADLs and discharge planning. The patient is planning  to be discharged home.   Patient's anticipated LOS is less than 2 midnights, meeting these requirements: - Lives within 1 hour of care - Has a competent adult at home to recover with post-op recover - NO history of  - Chronic pain requiring opiods  - Diabetes  - Coronary Artery Disease  - Heart failure  - Heart attack  - Stroke  - DVT/VTE  - Cardiac arrhythmia  - Respiratory Failure/COPD  - Renal failure  - Anemia  - Advanced Liver disease  Therapy Plans: PT at [facility] Disposition: Home with husband and daughter Planned DVT Prophylaxis: Aspirin 325mg  DME Needed: None PCP: Dr. (clearance in Epic) TXA: IV Allergies:  - Oxycodone - itching/can't sleep - Tramadol - itching/can't sleep Anesthesia Concerns: None BMI: 38.1 Last HgbA1c: N/A  Pharmacy: [facility] on [address] in [place]  Other: Patient prescribed Dilaudid by Dr. [employer] following left TKA in 2014.  - Patient was instructed on what medications to stop prior to surgery. - Follow-up visit in 2 weeks with Dr. 2015 - Begin physical therapy following surgery - Pre-operative lab work as pre-surgical testing - Prescriptions will be provided in hospital at time of discharge  Lequita Halt, [facility], PA-C Orthopedic Surgery [facility] [place]

## 2020-12-29 NOTE — Anesthesia Preprocedure Evaluation (Addendum)
Anesthesia Evaluation  Patient identified by MRN, date of birth, ID band Patient awake    Reviewed: Allergy & Precautions, H&P , NPO status , Patient's Chart, lab work & pertinent test results  Airway Mallampati: II  TM Distance: >3 FB Neck ROM: Full    Dental no notable dental hx.    Pulmonary neg pulmonary ROS, former smoker,    Pulmonary exam normal breath sounds clear to auscultation       Cardiovascular hypertension, Normal cardiovascular exam Rhythm:Regular Rate:Normal     Neuro/Psych negative neurological ROS  negative psych ROS   GI/Hepatic negative GI ROS, Neg liver ROS,   Endo/Other  negative endocrine ROSMorbid obesity  Renal/GU negative Renal ROS  negative genitourinary   Musculoskeletal negative musculoskeletal ROS (+) Arthritis , Osteoarthritis,    Abdominal   Peds negative pediatric ROS (+)  Hematology negative hematology ROS (+)   Anesthesia Other Findings   Reproductive/Obstetrics negative OB ROS                            Anesthesia Physical Anesthesia Plan  ASA: II  Anesthesia Plan: Spinal   Post-op Pain Management:  Regional for Post-op pain   Induction: Intravenous  PONV Risk Score and Plan: 3 and Ondansetron, Dexamethasone, Midazolam and Treatment may vary due to age or medical condition  Airway Management Planned: Simple Face Mask  Additional Equipment:   Intra-op Plan:   Post-operative Plan:   Informed Consent: I have reviewed the patients History and Physical, chart, labs and discussed the procedure including the risks, benefits and alternatives for the proposed anesthesia with the patient or authorized representative who has indicated his/her understanding and acceptance.     Dental advisory given  Plan Discussed with: CRNA and Surgeon  Anesthesia Plan Comments:         Anesthesia Quick Evaluation

## 2021-01-02 ENCOUNTER — Telehealth: Payer: Self-pay | Admitting: General Practice

## 2021-01-02 NOTE — Telephone Encounter (Signed)
Called patient to set up 3 month f/u to have pessary cleaned replaced.  Pt stated that she will give our office a call to schedule, d/t her having knee surgery on 01/09/2021.

## 2021-01-02 NOTE — Patient Instructions (Addendum)
DUE TO COVID-19 ONLY ONE VISITOR IS ALLOWED TO COME WITH YOU AND STAY IN THE WAITING ROOM ONLY DURING PRE OP AND PROCEDURE DAY OF SURGERY. THE 1 VISITOR  MAY VISIT WITH YOU AFTER SURGERY IN YOUR PRIVATE ROOM DURING VISITING HOURS ONLY!  YOU NEED TO HAVE A COVID 19 TEST ON_2/10______ @_11 :15______, THIS TEST MUST BE DONE BEFORE SURGERY,  COVID TESTING SITE [address] [place] , IT IS ON THE RIGHT GOING OUT [address] APPROXIMATELY  2 MINUTES PAST [employer] ON THE RIGHT. ONCE YOUR COVID TEST IS COMPLETED,  PLEASE BEGIN THE QUARANTINE INSTRUCTIONS AS OUTLINED IN YOUR HANDOUT.                Holly Herring    Your procedure is scheduled on: 01/09/21   Report to [facility] Main  Entrance   Report to admitting at   5:55 AM     Call this number if you have problems the morning of surgery [phone]    BRUSH YOUR TEETH MORNING OF SURGERY AND RINSE YOUR MOUTH OUT, NO CHEWING GUM CANDY OR MINTS.   No food after midnight.    You may have clear liquid until 5:00 AM.    At 4:30 AM drink pre surgery drink.   Nothing by mouth after 5:00 AM.   Take these medicines the morning of surgery with A SIP OF WATER: None                                 You may not have any metal on your body including hair pins and              piercings  Do not wear jewelry, make-up, lotions, powders or perfumes, deodorant             Do not wear nail polish on your fingernails.  Do not shave  48 hours prior to surgery.                Do not bring valuables to the hospital. [facility] IS NOT             RESPONSIBLE   FOR VALUABLES.  Contacts, dentures or bridgework may not be worn into surgery.     Name and phone number of your driver:  Special Instructions: N/A              Please read over the following fact sheets you were given: _____________________________________________________________________             [facility] - Preparing for Surgery Before  surgery, you can play an important role.  Because skin is not sterile, your skin needs to be as free of germs as possible.  You can reduce the number of germs on your skin by washing with CHG (chlorahexidine gluconate) soap before surgery.  CHG is an antiseptic cleaner which kills germs and bonds with the skin to continue killing germs even after washing. Please DO NOT use if you have an allergy to CHG or antibacterial soaps.  If your skin becomes reddened/irritated stop using the CHG and inform your nurse when you arrive at Short Stay. Do not shave (including legs and underarms) for at least 48 hours prior to the first CHG shower.    Please follow these instructions carefully:  1.  Shower with CHG Soap the night before surgery and the  morning of Surgery.  2.  If you choose to wash your hair, wash your hair first as usual with your  normal  shampoo.  3.  After you shampoo, rinse your hair and body thoroughly to remove the  shampoo.                                        4.  Use CHG as you would any other liquid soap.  You can apply chg directly  to the skin and wash                       Gently with a scrungie or clean washcloth.  5.  Apply the CHG Soap to your body ONLY FROM THE NECK DOWN.   Do not use on face/ open                           Wound or open sores. Avoid contact with eyes, ears mouth and genitals (private parts).                       Wash face,  Genitals (private parts) with your normal soap.             6.  Wash thoroughly, paying special attention to the area where your surgery  will be performed.  7.  Thoroughly rinse your body with warm water from the neck down.  8.  DO NOT shower/wash with your normal soap after using and rinsing off  the CHG Soap.             9.  Pat yourself dry with a clean towel.            10.  Wear clean pajamas.            11.  Place clean sheets on your bed the night of your first shower and do not  sleep with pets. Day of Surgery : Do not apply any  lotions/deodorants the morning of surgery.  Please wear clean clothes to the hospital/surgery center.  FAILURE TO FOLLOW THESE INSTRUCTIONS MAY RESULT IN THE CANCELLATION OF YOUR SURGERY PATIENT SIGNATURE_________________________________  NURSE SIGNATURE__________________________________  ________________________________________________________________________   Holly Herring  An incentive spirometer is a tool that can help keep your lungs clear and active. This tool measures how well you are filling your lungs with each breath. Taking long deep breaths may help reverse or decrease the chance of developing breathing (pulmonary) problems (especially infection) following:  A long period of time when you are unable to move or be active. BEFORE THE PROCEDURE   If the spirometer includes an indicator to show your best effort, your nurse or respiratory therapist will set it to a desired goal.  If possible, sit up straight or lean slightly forward. Try not to slouch.  Hold the incentive spirometer in an upright position. INSTRUCTIONS FOR USE  1. Sit on the edge of your bed if possible, or sit up as far as you can in bed or on a chair. 2. Hold the incentive spirometer in an upright position. 3. Breathe out normally. 4. Place the mouthpiece in your mouth and seal your lips tightly around it. 5. Breathe in slowly and as deeply as possible, raising the piston or the ball toward the top of the column. 6. Hold your breath for 3-5 seconds  or for as long as possible. Allow the piston or ball to fall to the bottom of the column. 7. Remove the mouthpiece from your mouth and breathe out normally. 8. Rest for a few seconds and repeat Steps 1 through 7 at least 10 times every 1-2 hours when you are awake. Take your time and take a few normal breaths between deep breaths. 9. The spirometer may include an indicator to show your best effort. Use the indicator as a goal to work toward during each  repetition. 10. After each set of 10 deep breaths, practice coughing to be sure your lungs are clear. If you have an incision (the cut made at the time of surgery), support your incision when coughing by placing a pillow or rolled up towels firmly against it. Once you are able to get out of bed, walk around indoors and cough well. You may stop using the incentive spirometer when instructed by your caregiver.  RISKS AND COMPLICATIONS  Take your time so you do not get dizzy or light-headed.  If you are in pain, you may need to take or ask for pain medication before doing incentive spirometry. It is harder to take a deep breath if you are having pain. AFTER USE  Rest and breathe slowly and easily.  It can be helpful to keep track of a log of your progress. Your caregiver can provide you with a simple table to help with this. If you are using the spirometer at home, follow these instructions: [place] IF:   You are having difficultly using the spirometer.  You have trouble using the spirometer as often as instructed.  Your pain medication is not giving enough relief while using the spirometer.  You develop fever of 100.5 F (38.1 C) or higher. SEEK IMMEDIATE MEDICAL CARE IF:   You cough up bloody sputum that had not been present before.  You develop fever of 102 F (38.9 C) or greater.  You develop worsening pain at or near the incision site. MAKE SURE YOU:   Understand these instructions.  Will watch your condition.  Will get help right away if you are not doing well or get worse. Document Released: 03/25/2007 Document Revised: 02/04/2012 Document Reviewed: 05/26/2007 [facility] Patient Information 2014 [place], [place].   ________________________________________________________________________

## 2021-01-03 ENCOUNTER — Other Ambulatory Visit: Payer: Self-pay

## 2021-01-03 ENCOUNTER — Encounter (HOSPITAL_COMMUNITY)
Admission: RE | Admit: 2021-01-03 | Discharge: 2021-01-03 | Disposition: A | Discharge status: Home / Self Care | Payer: Medicare Other | Source: Ambulatory Visit | Attending: Orthopedic Surgery | Admitting: Orthopedic Surgery

## 2021-01-03 ENCOUNTER — Encounter (HOSPITAL_COMMUNITY): Payer: Self-pay

## 2021-01-03 DIAGNOSIS — I10 Essential (primary) hypertension: Secondary | ICD-10-CM | POA: Diagnosis not present

## 2021-01-03 DIAGNOSIS — Z01818 Encounter for other preprocedural examination: Secondary | ICD-10-CM | POA: Insufficient documentation

## 2021-01-03 HISTORY — DX: Uterovaginal prolapse, unspecified: N81.4

## 2021-01-03 LAB — COMPREHENSIVE METABOLIC PANEL
ALT: 14 U/L (ref 0–44)
AST: 21 U/L (ref 15–41)
Albumin: 4 g/dL (ref 3.5–5.0)
Alkaline Phosphatase: 45 U/L (ref 38–126)
Anion gap: 9 (ref 5–15)
BUN: 12 mg/dL (ref 8–23)
CO2: 25 mmol/L (ref 22–32)
Calcium: 9.5 mg/dL (ref 8.9–10.3)
Chloride: 103 mmol/L (ref 98–111)
Creatinine, Ser: 0.73 mg/dL (ref 0.44–1.00)
GFR, Estimated: >60 mL/min (ref >60)
Glucose, Bld: 91 mg/dL (ref 70–99)
Potassium: 3.9 mmol/L (ref 3.5–5.1)
Sodium: 137 mmol/L (ref 135–145)
Total Bilirubin: 0.6 mg/dL (ref 0.3–1.2)
Total Protein: 7.6 g/dL (ref 6.5–8.1)

## 2021-01-03 LAB — CBC
HCT: 40.2 % (ref 36.0–46.0)
Hemoglobin: 12.7 g/dL (ref 12.0–15.0)
MCH: 27.5 pg (ref 26.0–34.0)
MCHC: 31.6 g/dL (ref 30.0–36.0)
MCV: 87 fL (ref 80.0–100.0)
Platelets: 357 10*3/uL (ref 150–400)
RBC: 4.62 MIL/uL (ref 3.87–5.11)
RDW: 14.9 % (ref 11.5–15.5)
WBC: 5.5 10*3/uL (ref 4.0–10.5)
nRBC: 0 % (ref 0.0–0.2)

## 2021-01-03 LAB — APTT: aPTT: 32 seconds (ref 24–36)

## 2021-01-03 LAB — SURGICAL PCR SCREEN
MRSA, PCR: NEGATIVE
Staphylococcus aureus: NEGATIVE

## 2021-01-03 LAB — NO BLOOD PRODUCTS

## 2021-01-03 LAB — PROTIME-INR
INR: 0.9 (ref 0.8–1.2)
Prothrombin Time: 12.1 seconds (ref 11.4–15.2)

## 2021-01-03 NOTE — Progress Notes (Signed)
COVID Vaccine Completed:Yes Date COVID Vaccine completed:02/09/20-booster 08/27/20 COVID vaccine manufacturer: Pfizer     PCP - Dr. Zipporah Plants Cardiologist - no  Chest x-ray - no EKG - 01/03/21 Chart and Epic Stress Test - no ECHO - no Cardiac Cath - no Pacemaker/ICD device last checked:NA  Sleep Study - no CPAP -   Fasting Blood Sugar - NA Checks Blood Sugar _____ times a day  Blood Thinner Instructions:ASA 81/ dr Carmelia Roller Aspirin Instructions:Stop 7 days prior to DOS/ Aluisio Last Dose:01/02/21  Anesthesia review:   Patient denies shortness of breath, fever, cough and chest pain at PAT appointment yes  Patient verbalized understanding of instructions that were given to them at the PAT appointment. Patient was also instructed that they will need to review over the PAT instructions again at home before surgery. Yes Pt has no SOB with any activities.

## 2021-01-04 LAB — TYPE AND SCREEN: ABO/RH(D): O POS

## 2021-01-05 ENCOUNTER — Other Ambulatory Visit (HOSPITAL_COMMUNITY)
Admission: RE | Admit: 2021-01-05 | Discharge: 2021-01-05 | Disposition: A | Discharge status: Home / Self Care | Payer: Medicare Other | Source: Ambulatory Visit | Attending: Orthopedic Surgery | Admitting: Orthopedic Surgery

## 2021-01-05 DIAGNOSIS — Z01812 Encounter for preprocedural laboratory examination: Secondary | ICD-10-CM | POA: Diagnosis not present

## 2021-01-05 DIAGNOSIS — Z20822 Contact with and (suspected) exposure to covid-19: Secondary | ICD-10-CM | POA: Insufficient documentation

## 2021-01-05 LAB — SARS CORONAVIRUS 2 (TAT 6-24 HRS): SARS Coronavirus 2: NEGATIVE

## 2021-01-08 MED ORDER — BUPIVACAINE LIPOSOME 1.3 % IJ SUSP
20.0000 mL | INTRAMUSCULAR | Status: DC
  Filled 2021-01-08: qty 20

## 2021-01-09 ENCOUNTER — Encounter (HOSPITAL_COMMUNITY)
Admission: RE | Disposition: A | Discharge status: Home / Self Care | Payer: Self-pay | Source: Home / Self Care | Attending: Orthopedic Surgery

## 2021-01-09 ENCOUNTER — Ambulatory Visit (HOSPITAL_COMMUNITY): Payer: Medicare Other | Admitting: Anesthesiology

## 2021-01-09 ENCOUNTER — Other Ambulatory Visit: Payer: Self-pay

## 2021-01-09 ENCOUNTER — Ambulatory Visit (HOSPITAL_COMMUNITY)
Admission: RE | Admit: 2021-01-09 | Discharge: 2021-01-10 | Disposition: A | Discharge status: Home / Self Care | Payer: Medicare Other | Attending: Orthopedic Surgery | Admitting: Orthopedic Surgery

## 2021-01-09 ENCOUNTER — Encounter (HOSPITAL_COMMUNITY): Payer: Self-pay | Admitting: Orthopedic Surgery

## 2021-01-09 DIAGNOSIS — Z880 Allergy status to penicillin: Secondary | ICD-10-CM | POA: Diagnosis not present

## 2021-01-09 DIAGNOSIS — M25761 Osteophyte, right knee: Secondary | ICD-10-CM | POA: Diagnosis not present

## 2021-01-09 DIAGNOSIS — Z87891 Personal history of nicotine dependence: Secondary | ICD-10-CM | POA: Insufficient documentation

## 2021-01-09 DIAGNOSIS — Z7982 Long term (current) use of aspirin: Secondary | ICD-10-CM | POA: Insufficient documentation

## 2021-01-09 DIAGNOSIS — Z885 Allergy status to narcotic agent status: Secondary | ICD-10-CM | POA: Diagnosis not present

## 2021-01-09 DIAGNOSIS — M1710 Unilateral primary osteoarthritis, unspecified knee: Secondary | ICD-10-CM | POA: Diagnosis present

## 2021-01-09 DIAGNOSIS — Z79890 Hormone replacement therapy (postmenopausal): Secondary | ICD-10-CM | POA: Insufficient documentation

## 2021-01-09 DIAGNOSIS — Z79899 Other long term (current) drug therapy: Secondary | ICD-10-CM | POA: Diagnosis not present

## 2021-01-09 DIAGNOSIS — M1711 Unilateral primary osteoarthritis, right knee: Secondary | ICD-10-CM | POA: Diagnosis not present

## 2021-01-09 DIAGNOSIS — I10 Essential (primary) hypertension: Secondary | ICD-10-CM | POA: Diagnosis not present

## 2021-01-09 DIAGNOSIS — G8918 Other acute postprocedural pain: Secondary | ICD-10-CM | POA: Diagnosis not present

## 2021-01-09 DIAGNOSIS — T8482XA Fibrosis due to internal orthopedic prosthetic devices, implants and grafts, initial encounter: Secondary | ICD-10-CM | POA: Diagnosis present

## 2021-01-09 HISTORY — PX: TOTAL KNEE ARTHROPLASTY: SHX125

## 2021-01-09 SURGERY — ARTHROPLASTY, KNEE, TOTAL
Anesthesia: Spinal | Site: Knee | Laterality: Right

## 2021-01-09 MED ORDER — MENTHOL 3 MG MT LOZG: 1.0000 | LOZENGE | OROMUCOSAL | Status: DC | PRN

## 2021-01-09 MED ORDER — POVIDONE-IODINE 10 % EX SWAB: 2.0000 "application " | Freq: Once | CUTANEOUS | Status: DC

## 2021-01-09 MED ORDER — ACETAMINOPHEN 500 MG PO TABS
1000.0000 mg | ORAL_TABLET | Freq: Four times a day (QID) | ORAL | Status: AC
  Administered 2021-01-09 – 2021-01-10 (×3): 1000 mg via ORAL
  Filled 2021-01-09 (×3): qty 2

## 2021-01-09 MED ORDER — PROPOFOL 500 MG/50ML IV EMUL
INTRAVENOUS | Status: AC
  Filled 2021-01-09: qty 50

## 2021-01-09 MED ORDER — BUPIVACAINE HCL (PF) 0.5 % IJ SOLN
INTRAMUSCULAR | Status: DC | PRN
  Administered 2021-01-09: 20 mL via PERINEURAL

## 2021-01-09 MED ORDER — LIDOCAINE HCL (PF) 2 % IJ SOLN
INTRAMUSCULAR | Status: AC
  Filled 2021-01-09: qty 5

## 2021-01-09 MED ORDER — FENTANYL CITRATE (PF) 100 MCG/2ML IJ SOLN
INTRAMUSCULAR | Status: AC
  Administered 2021-01-09: 50 ug via INTRAVENOUS
  Filled 2021-01-09: qty 2

## 2021-01-09 MED ORDER — DEXAMETHASONE SODIUM PHOSPHATE 10 MG/ML IJ SOLN: 8.0000 mg | Freq: Once | INTRAMUSCULAR | Status: DC

## 2021-01-09 MED ORDER — SODIUM CHLORIDE (PF) 0.9 % IJ SOLN
INTRAMUSCULAR | Status: AC
  Filled 2021-01-09: qty 10

## 2021-01-09 MED ORDER — ORAL CARE MOUTH RINSE: 15.0000 mL | Freq: Once | OROMUCOSAL | Status: DC

## 2021-01-09 MED ORDER — FLEET ENEMA 7-19 GM/118ML RE ENEM: 1.0000 | ENEMA | Freq: Once | RECTAL | Status: DC | PRN

## 2021-01-09 MED ORDER — PHENYLEPHRINE 40 MCG/ML (10ML) SYRINGE FOR IV PUSH (FOR BLOOD PRESSURE SUPPORT)
PREFILLED_SYRINGE | INTRAVENOUS | Status: AC
  Filled 2021-01-09: qty 10

## 2021-01-09 MED ORDER — LIDOCAINE HCL (CARDIAC) PF 100 MG/5ML IV SOSY
PREFILLED_SYRINGE | INTRAVENOUS | Status: DC | PRN
  Administered 2021-01-09: 40 mg via INTRAVENOUS

## 2021-01-09 MED ORDER — POLYETHYLENE GLYCOL 3350 17 G PO PACK: 17.0000 g | PACK | Freq: Every day | ORAL | Status: DC | PRN

## 2021-01-09 MED ORDER — ACETAMINOPHEN 10 MG/ML IV SOLN
1000.0000 mg | Freq: Four times a day (QID) | INTRAVENOUS | Status: DC
  Administered 2021-01-09: 1000 mg via INTRAVENOUS
  Filled 2021-01-09: qty 100

## 2021-01-09 MED ORDER — ONDANSETRON HCL 4 MG/2ML IJ SOLN
INTRAMUSCULAR | Status: DC | PRN
  Administered 2021-01-09: 4 mg via INTRAVENOUS

## 2021-01-09 MED ORDER — METHOCARBAMOL 500 MG PO TABS
500.0000 mg | ORAL_TABLET | Freq: Four times a day (QID) | ORAL | Status: DC | PRN
  Administered 2021-01-09 – 2021-01-10 (×2): 500 mg via ORAL
  Filled 2021-01-09 (×2): qty 1

## 2021-01-09 MED ORDER — PHENYLEPHRINE 40 MCG/ML (10ML) SYRINGE FOR IV PUSH (FOR BLOOD PRESSURE SUPPORT)
PREFILLED_SYRINGE | INTRAVENOUS | Status: DC | PRN
  Administered 2021-01-09: 40 ug via INTRAVENOUS
  Administered 2021-01-09: 80 ug via INTRAVENOUS
  Administered 2021-01-09: 40 ug via INTRAVENOUS
  Administered 2021-01-09 (×3): 80 ug via INTRAVENOUS

## 2021-01-09 MED ORDER — ONDANSETRON HCL 4 MG/2ML IJ SOLN
INTRAMUSCULAR | Status: AC
  Filled 2021-01-09: qty 2

## 2021-01-09 MED ORDER — BUPIVACAINE LIPOSOME 1.3 % IJ SUSP
INTRAMUSCULAR | Status: DC | PRN
  Administered 2021-01-09: 20 mL

## 2021-01-09 MED ORDER — CEFAZOLIN SODIUM-DEXTROSE 2-4 GM/100ML-% IV SOLN
2.0000 g | Freq: Four times a day (QID) | INTRAVENOUS | Status: AC
Start: 2021-01-09 — End: 2021-01-09
  Administered 2021-01-09 (×2): 2 g via INTRAVENOUS
  Filled 2021-01-09 (×2): qty 100

## 2021-01-09 MED ORDER — PROPOFOL 500 MG/50ML IV EMUL
INTRAVENOUS | Status: DC | PRN
  Administered 2021-01-09: 80 ug/kg/min via INTRAVENOUS

## 2021-01-09 MED ORDER — SODIUM CHLORIDE (PF) 0.9 % IJ SOLN
INTRAMUSCULAR | Status: DC | PRN
  Administered 2021-01-09: 60 mL

## 2021-01-09 MED ORDER — HYDROMORPHONE HCL 2 MG PO TABS
4.0000 mg | ORAL_TABLET | ORAL | Status: DC | PRN
  Administered 2021-01-10 (×4): 4 mg via ORAL
  Filled 2021-01-09 (×4): qty 2

## 2021-01-09 MED ORDER — HYDROMORPHONE HCL 1 MG/ML IJ SOLN: 0.2500 mg | INTRAMUSCULAR | Status: DC | PRN

## 2021-01-09 MED ORDER — LACTATED RINGERS IV SOLN: INTRAVENOUS | Status: DC

## 2021-01-09 MED ORDER — PROPOFOL 1000 MG/100ML IV EMUL
INTRAVENOUS | Status: AC
  Filled 2021-01-09: qty 100

## 2021-01-09 MED ORDER — SODIUM CHLORIDE 0.9 % IV SOLN: INTRAVENOUS | Status: DC

## 2021-01-09 MED ORDER — ONDANSETRON HCL 4 MG PO TABS
4.0000 mg | ORAL_TABLET | Freq: Four times a day (QID) | ORAL | Status: DC | PRN
  Administered 2021-01-10: 4 mg via ORAL
  Filled 2021-01-09: qty 1

## 2021-01-09 MED ORDER — TRANEXAMIC ACID-NACL 1000-0.7 MG/100ML-% IV SOLN
1000.0000 mg | INTRAVENOUS | Status: AC
  Administered 2021-01-09: 1000 mg via INTRAVENOUS
  Filled 2021-01-09: qty 100

## 2021-01-09 MED ORDER — DEXAMETHASONE SODIUM PHOSPHATE 10 MG/ML IJ SOLN
INTRAMUSCULAR | Status: DC | PRN
  Administered 2021-01-09: 5 mg via INTRAVENOUS

## 2021-01-09 MED ORDER — CHLORHEXIDINE GLUCONATE 0.12 % MT SOLN: 15.0000 mL | Freq: Once | OROMUCOSAL | Status: DC

## 2021-01-09 MED ORDER — ONDANSETRON HCL 4 MG/2ML IJ SOLN: 4.0000 mg | Freq: Once | INTRAMUSCULAR | Status: DC | PRN

## 2021-01-09 MED ORDER — ESTRADIOL 1 MG PO TABS
1.0000 mg | ORAL_TABLET | Freq: Every day | ORAL | Status: DC
  Administered 2021-01-10: 1 mg via ORAL
  Filled 2021-01-09: qty 1

## 2021-01-09 MED ORDER — GABAPENTIN 100 MG PO CAPS
200.0000 mg | ORAL_CAPSULE | Freq: Three times a day (TID) | ORAL | Status: DC
  Administered 2021-01-09 – 2021-01-10 (×3): 200 mg via ORAL
  Filled 2021-01-09 (×3): qty 2

## 2021-01-09 MED ORDER — HYDROMORPHONE HCL 2 MG PO TABS
2.0000 mg | ORAL_TABLET | ORAL | Status: DC | PRN
  Administered 2021-01-09 (×2): 2 mg via ORAL
  Filled 2021-01-09 (×2): qty 1

## 2021-01-09 MED ORDER — DOCUSATE SODIUM 100 MG PO CAPS
100.0000 mg | ORAL_CAPSULE | Freq: Two times a day (BID) | ORAL | Status: DC
  Administered 2021-01-10: 100 mg via ORAL
  Filled 2021-01-09 (×2): qty 1

## 2021-01-09 MED ORDER — METOCLOPRAMIDE HCL 5 MG/ML IJ SOLN
5.0000 mg | Freq: Three times a day (TID) | INTRAMUSCULAR | Status: DC | PRN
Start: 2021-01-09 — End: 2021-01-10

## 2021-01-09 MED ORDER — LISINOPRIL 10 MG PO TABS
10.0000 mg | ORAL_TABLET | Freq: Every day | ORAL | Status: DC
Start: 2021-01-10 — End: 2021-01-10
  Administered 2021-01-10: 10 mg via ORAL
  Filled 2021-01-09: qty 1

## 2021-01-09 MED ORDER — FENTANYL CITRATE (PF) 100 MCG/2ML IJ SOLN: 50.0000 ug | INTRAMUSCULAR | Status: DC

## 2021-01-09 MED ORDER — DIPHENHYDRAMINE HCL 12.5 MG/5ML PO ELIX
12.5000 mg | ORAL_SOLUTION | ORAL | Status: DC | PRN
Start: 2021-01-09 — End: 2021-01-10
  Administered 2021-01-09 (×2): 12.5 mg via ORAL
  Filled 2021-01-09 (×2): qty 5

## 2021-01-09 MED ORDER — MIDAZOLAM HCL 2 MG/2ML IJ SOLN: 1.0000 mg | INTRAMUSCULAR | Status: DC

## 2021-01-09 MED ORDER — DEXAMETHASONE SODIUM PHOSPHATE 10 MG/ML IJ SOLN
10.0000 mg | Freq: Once | INTRAMUSCULAR | Status: AC
  Administered 2021-01-10: 10 mg via INTRAVENOUS
  Filled 2021-01-09: qty 1

## 2021-01-09 MED ORDER — SODIUM CHLORIDE 0.9 % IR SOLN
Status: DC | PRN
  Administered 2021-01-09: 1000 mL

## 2021-01-09 MED ORDER — CEFAZOLIN SODIUM-DEXTROSE 2-4 GM/100ML-% IV SOLN
2.0000 g | INTRAVENOUS | Status: AC
  Administered 2021-01-09: 2 g via INTRAVENOUS
  Filled 2021-01-09: qty 100

## 2021-01-09 MED ORDER — BISACODYL 10 MG RE SUPP: 10.0000 mg | Freq: Every day | RECTAL | Status: DC | PRN

## 2021-01-09 MED ORDER — METHOCARBAMOL 1000 MG/10ML IJ SOLN
500.0000 mg | Freq: Four times a day (QID) | INTRAVENOUS | Status: DC | PRN
  Filled 2021-01-09: qty 5

## 2021-01-09 MED ORDER — ESTRADIOL 0.1 MG/GM VA CREA: 1.0000 | TOPICAL_CREAM | VAGINAL | Status: DC

## 2021-01-09 MED ORDER — HYDROMORPHONE HCL 1 MG/ML IJ SOLN
0.5000 mg | INTRAMUSCULAR | Status: DC | PRN
  Administered 2021-01-09 (×2): 1 mg via INTRAVENOUS
  Filled 2021-01-09 (×2): qty 1

## 2021-01-09 MED ORDER — MIDAZOLAM HCL 2 MG/2ML IJ SOLN
INTRAMUSCULAR | Status: AC
  Administered 2021-01-09: 1 mg via INTRAVENOUS
  Filled 2021-01-09: qty 2

## 2021-01-09 MED ORDER — ONDANSETRON HCL 4 MG/2ML IJ SOLN: 4.0000 mg | Freq: Four times a day (QID) | INTRAMUSCULAR | Status: DC | PRN

## 2021-01-09 MED ORDER — ASPIRIN EC 325 MG PO TBEC
325.0000 mg | DELAYED_RELEASE_TABLET | Freq: Two times a day (BID) | ORAL | Status: DC
  Administered 2021-01-10: 325 mg via ORAL
  Filled 2021-01-09: qty 1

## 2021-01-09 MED ORDER — DEXAMETHASONE SODIUM PHOSPHATE 10 MG/ML IJ SOLN
INTRAMUSCULAR | Status: AC
  Filled 2021-01-09: qty 1

## 2021-01-09 MED ORDER — HYDROCHLOROTHIAZIDE 12.5 MG PO CAPS
12.5000 mg | ORAL_CAPSULE | Freq: Every day | ORAL | Status: DC
  Administered 2021-01-10: 12.5 mg via ORAL
  Filled 2021-01-09: qty 1

## 2021-01-09 MED ORDER — PHENOL 1.4 % MT LIQD: 1.0000 | OROMUCOSAL | Status: DC | PRN

## 2021-01-09 MED ORDER — LISINOPRIL-HYDROCHLOROTHIAZIDE 10-12.5 MG PO TABS: 1.0000 | ORAL_TABLET | Freq: Every day | ORAL | Status: DC

## 2021-01-09 MED ORDER — STERILE WATER FOR IRRIGATION IR SOLN
Status: DC | PRN
  Administered 2021-01-09: 2000 mL

## 2021-01-09 MED ORDER — PROPOFOL 10 MG/ML IV BOLUS
INTRAVENOUS | Status: DC | PRN
  Administered 2021-01-09: 20 mg via INTRAVENOUS
  Administered 2021-01-09: 30 mg via INTRAVENOUS
  Administered 2021-01-09 (×3): 20 mg via INTRAVENOUS

## 2021-01-09 MED ORDER — METOCLOPRAMIDE HCL 5 MG PO TABS: 5.0000 mg | ORAL_TABLET | Freq: Three times a day (TID) | ORAL | Status: DC | PRN

## 2021-01-09 MED ORDER — BUPIVACAINE IN DEXTROSE 0.75-8.25 % IT SOLN
INTRATHECAL | Status: DC | PRN
  Administered 2021-01-09: 1.4 mL via INTRATHECAL

## 2021-01-09 SURGICAL SUPPLY — 56 items
ATTUNE PSFEM RTSZ4 NARCEM KNEE (Femur) ×2 IMPLANT
ATTUNE PSRP INSR SZ4 10 KNEE (Insert) ×2 IMPLANT
BAG SPEC THK2 15X12 ZIP CLS (MISCELLANEOUS) ×1
BAG ZIPLOCK 12X15 (MISCELLANEOUS) ×2 IMPLANT
BASE TIBIAL ROT PLAT SZ 3 KNEE (Knees) ×1 IMPLANT
BLADE SAG 18X100X1.27 (BLADE) ×2 IMPLANT
BLADE SAW SGTL 11.0X1.19X90.0M (BLADE) ×2 IMPLANT
BNDG ELASTIC 6X5.8 VLCR STR LF (GAUZE/BANDAGES/DRESSINGS) ×2 IMPLANT
BOWL SMART MIX CTS (DISPOSABLE) ×2 IMPLANT
BSPLAT TIB 3 CMNT ROT PLAT STR (Knees) ×1 IMPLANT
CEMENT HV SMART SET (Cement) ×4 IMPLANT
COVER SURGICAL LIGHT HANDLE (MISCELLANEOUS) ×2 IMPLANT
COVER WAND RF STERILE (DRAPES) IMPLANT
CUFF TOURN SGL QUICK 34 (TOURNIQUET CUFF) ×2
CUFF TRNQT CYL 34X4.125X (TOURNIQUET CUFF) ×1 IMPLANT
DECANTER SPIKE VIAL GLASS SM (MISCELLANEOUS) ×2 IMPLANT
DRAPE U-SHAPE 47X51 STRL (DRAPES) ×2 IMPLANT
DRSG AQUACEL AG ADV 3.5X10 (GAUZE/BANDAGES/DRESSINGS) ×2 IMPLANT
DURAPREP 26ML APPLICATOR (WOUND CARE) ×2 IMPLANT
ELECT REM PT RETURN 15FT ADLT (MISCELLANEOUS) ×2 IMPLANT
GLOVE SRG 8 PF TXTR STRL LF DI (GLOVE) ×1 IMPLANT
GLOVE SURG ENC MOIS LTX SZ6 (GLOVE) IMPLANT
GLOVE SURG ENC MOIS LTX SZ7 (GLOVE) ×2 IMPLANT
GLOVE SURG ENC MOIS LTX SZ8 (GLOVE) ×2 IMPLANT
GLOVE SURG UNDER POLY LF SZ6.5 (GLOVE) ×2 IMPLANT
GLOVE SURG UNDER POLY LF SZ8 (GLOVE) ×2
GLOVE SURG UNDER POLY LF SZ8.5 (GLOVE) ×2 IMPLANT
GOWN STRL REUS W/TWL LRG LVL3 (GOWN DISPOSABLE) ×4 IMPLANT
GOWN STRL REUS W/TWL XL LVL3 (GOWN DISPOSABLE) ×2 IMPLANT
HANDPIECE INTERPULSE COAX TIP (DISPOSABLE) ×2
HOLDER FOLEY CATH W/STRAP (MISCELLANEOUS) IMPLANT
IMMOBILIZER KNEE 20 (SOFTGOODS) ×2
IMMOBILIZER KNEE 20 THIGH 36 (SOFTGOODS) ×1 IMPLANT
KIT TURNOVER KIT A (KITS) ×2 IMPLANT
MANIFOLD NEPTUNE II (INSTRUMENTS) ×2 IMPLANT
NS IRRIG 1000ML POUR BTL (IV SOLUTION) ×2 IMPLANT
PACK TOTAL KNEE CUSTOM (KITS) ×2 IMPLANT
PADDING CAST COTTON 6X4 STRL (CAST SUPPLIES) ×4 IMPLANT
PADDING CAST SYN 6 (CAST SUPPLIES) ×1
PADDING CAST SYNTHETIC 6X4 NS (CAST SUPPLIES) ×1 IMPLANT
PATELLA MEDIAL ATTUN 35MM KNEE (Knees) ×2 IMPLANT
PENCIL SMOKE EVACUATOR (MISCELLANEOUS) ×2 IMPLANT
PIN DRILL FIX HALF THREAD (BIT) ×2 IMPLANT
PIN STEINMAN FIXATION KNEE (PIN) ×2 IMPLANT
PROTECTOR NERVE ULNAR (MISCELLANEOUS) ×2 IMPLANT
SET HNDPC FAN SPRY TIP SCT (DISPOSABLE) ×1 IMPLANT
STRIP CLOSURE SKIN 1/2X4 (GAUZE/BANDAGES/DRESSINGS) ×2 IMPLANT
SUT MNCRL AB 4-0 PS2 18 (SUTURE) ×2 IMPLANT
SUT STRATAFIX 0 PDS 27 VIOLET (SUTURE) ×2
SUT VIC AB 2-0 CT1 27 (SUTURE) ×6
SUT VIC AB 2-0 CT1 TAPERPNT 27 (SUTURE) ×3 IMPLANT
SUTURE STRATFX 0 PDS 27 VIOLET (SUTURE) ×1 IMPLANT
TIBIAL BASE ROT PLAT SZ 3 KNEE (Knees) ×2 IMPLANT
TRAY FOLEY MTR SLVR 16FR STAT (SET/KITS/TRAYS/PACK) ×2 IMPLANT
WATER STERILE IRR 1000ML POUR (IV SOLUTION) ×4 IMPLANT
WRAP KNEE MAXI GEL POST OP (GAUZE/BANDAGES/DRESSINGS) ×2 IMPLANT

## 2021-01-09 NOTE — Evaluation (Signed)
Physical Therapy Evaluation Patient Details Name: Holly Herring MRN: [id number] DOB: 1951/09/06 Today's Date: 01/09/2021   History of Present Illness  s/p R TKA  Clinical Impression  Pt is s/p TKA resulting in the deficits listed below (see PT Problem List).  Pt amb 2' with RW and min/guard. Initiated HEP, IND SLRs today. Anticipate excellent progress in acute settting  Pt will benefit from skilled PT to increase their independence and safety with mobility to allow discharge to the venue listed below    Follow Up Recommendations Follow surgeon's recommendation for DC plan and follow-up therapies;Outpatient PT    Equipment Recommendations  None recommended by PT    Recommendations for Other Services       Precautions / Restrictions Precautions Precautions: Fall;Knee Required Braces or Orthoses: Knee Immobilizer - Right Knee Immobilizer - Right: Discontinue once straight leg raise with < 10 degree lag Restrictions Weight Bearing Restrictions: No Other Position/Activity Restrictions: WBAT      Mobility  Bed Mobility Overal bed mobility: Needs Assistance Bed Mobility: Supine to Sit;Sit to Supine     Supine to sit: Min guard;Supervision Sit to supine: Supervision;Min guard   General bed mobility comments: for safety    Transfers Overall transfer level: Needs assistance Equipment used: Rolling walker (2 wheeled) Transfers: Sit to/from Stand Sit to Stand: Min guard;Min assist         General transfer comment: self cues for hand placement and to power up with LLE, position RLE  Ambulation/Gait Ambulation/Gait assistance: Min guard Gait Distance (Feet): 55 Feet Assistive device: Rolling walker (2 wheeled) Gait Pattern/deviations: Step-to pattern;Decreased stance time - right     General Gait Details: cues for initial sequence  Stairs            Wheelchair Mobility    Modified Rankin (Stroke Patients Only)       Balance                                              Pertinent Vitals/Pain Pain Assessment: 0-10 Pain Score: 3  Pain Location: right knee Pain Descriptors / Indicators: Discomfort Pain Intervention(s): Limited activity within patient's tolerance;Monitored during session;Premedicated before session;Repositioned    Home Living Family/patient expects to be discharged to:: Private residence Living Arrangements: Spouse/significant other Available Help at Discharge: Available 24 hours/day Type of Home:  (townhouse) Home Access: Level entry     Home Layout: Able to live on main level with bedroom/bathroom Home Equipment: Walker - 2 wheels;Bedside commode      Prior Function Level of Independence: Independent         Comments: very Independent at baseline, supportive family to assist prn. pt worked as Physicist, medical at [employer]        Extremity/Trunk Assessment   Upper Extremity Assessment Upper Extremity Assessment: Overall WFL for tasks assessed    Lower Extremity Assessment Lower Extremity Assessment: RLE deficits/detail RLE Deficits / Details: ankle WL, knee and hip grossly 2+/5. AROM R knee flexion ~ 6 to 65 degrees       Communication   Communication: No difficulties  Cognition Arousal/Alertness: Awake/alert Behavior During Therapy: WFL for tasks assessed/performed Overall Cognitive Status: Within Functional Limits for tasks assessed  General Comments      Exercises Total Joint Exercises Ankle Circles/Pumps: AROM;Both;10 reps Quad Sets: 5 reps;Both;AROM   Assessment/Plan    PT Assessment Patient needs continued PT services  PT Problem List Decreased strength;Decreased mobility;Decreased range of motion;Decreased activity tolerance;Pain;Decreased knowledge of precautions       PT Treatment Interventions DME instruction;Therapeutic activities;Gait training;Therapeutic exercise;Patient/family  education;Functional mobility training    PT Goals (Current goals can be found in the Care Plan section)  Acute Rehab PT Goals Patient Stated Goal: knee better and get back to being IND PT Goal Formulation: With patient Time For Goal Achievement: 01/16/21 Potential to Achieve Goals: Good    Frequency 7X/week   Barriers to discharge        Co-evaluation               AM-PAC PT "6 Clicks" Mobility  Outcome Measure Help needed turning from your back to your side while in a flat bed without using bedrails?: A Little Help needed moving from lying on your back to sitting on the side of a flat bed without using bedrails?: A Little Help needed moving to and from a bed to a chair (including a wheelchair)?: A Little Help needed standing up from a chair using your arms (e.g., wheelchair or bedside chair)?: A Little Help needed to walk in hospital room?: A Little Help needed climbing 3-5 steps with a railing? : A Little 6 Click Score: 18    End of Session Equipment Utilized During Treatment: Gait belt Activity Tolerance: Patient tolerated treatment well Patient left: in bed;with call bell/phone within reach;with bed alarm set Nurse Communication: Mobility status PT Visit Diagnosis: Other abnormalities of gait and mobility (R26.89)    Time: 8242-3536 PT Time Calculation (min) (ACUTE ONLY): 29 min   Charges:   PT Evaluation $PT Eval Low Complexity: 1 Low PT Treatments $Gait Training: 8-22 mins        Delice Bison, PT  Acute Rehab Dept ([facility]) [phone] Pager [phone]  01/09/2021   [facility] 01/09/2021, 4:23 PM

## 2021-01-09 NOTE — Progress Notes (Signed)
Assisted Dr. Rose with right, ultrasound guided, adductor canal block. Side rails up, monitors on throughout procedure. See vital signs in flow sheet. Tolerated Procedure well.  

## 2021-01-09 NOTE — Progress Notes (Signed)
Orthopedic Tech Progress Note Patient Details:  Holly Herring 1951/06/01 [id number]  CPM Right Knee CPM Right Knee: On Right Knee Flexion (Degrees): 40 Right Knee Extension (Degrees): 10  Post Interventions Patient Tolerated: Well Instructions Provided: Care of device  Saul Fordyce 01/09/2021, 11:16 AM

## 2021-01-09 NOTE — Anesthesia Postprocedure Evaluation (Signed)
Anesthesia Post Note  Patient: Holly Herring  Procedure(s) Performed: TOTAL KNEE ARTHROPLASTY (Right Knee)     Patient location during evaluation: PACU Anesthesia Type: Spinal Level of consciousness: oriented and awake and alert Pain management: pain level controlled Vital Signs Assessment: post-procedure vital signs reviewed and stable Respiratory status: spontaneous breathing, respiratory function stable and patient connected to nasal cannula oxygen Cardiovascular status: blood pressure returned to baseline and stable Postop Assessment: no headache, no backache and no apparent nausea or vomiting Anesthetic complications: no   No complications documented.  Last Vitals:  Vitals:   01/09/21 1030 01/09/21 1045  BP: 118/66 123/79  Pulse: 62 61  Resp: 13 19  Temp:    SpO2: 100% 100%    Last Pain:  Vitals:   01/09/21 1015  TempSrc:   PainSc: 0-No pain                 Tanyon Alipio S

## 2021-01-09 NOTE — Anesthesia Procedure Notes (Signed)
Anesthesia Procedure Image    

## 2021-01-09 NOTE — Anesthesia Procedure Notes (Addendum)
Anesthesia Regional Block: Adductor canal block   Pre-Anesthetic Checklist: ,, timeout performed, Correct Patient, Correct Site, Correct Laterality, Correct Procedure, Correct Position, site marked, Risks and benefits discussed,  Surgical consent,  Pre-op evaluation,  At surgeon's request and post-op pain management  Laterality: Right  Prep: chloraprep       Needles:  Injection technique: Single-shot  Needle Type: Echogenic Needle     Needle Length: 9cm      Additional Needles:   Procedures:,,,, ultrasound used (permanent image in chart),,,,  Narrative:  Start time: 01/09/2021 7:28 AM End time: 01/09/2021 7:33 AM Injection made incrementally with aspirations every 5 mL.  Performed by: Personally  Anesthesiologist: Eilene Ghazi, MD  Additional Notes: Patient tolerated the procedure well without complications

## 2021-01-09 NOTE — Progress Notes (Signed)
Orthopedic Tech Progress Note Patient Details:  Holly Herring 12-21-1950 [id number]  CPM Right Knee CPM Right Knee: Off Right Knee Flexion (Degrees): 40 Right Knee Extension (Degrees): 10  Post Interventions Patient Tolerated: Well Instructions Provided: Care of device  Saul Fordyce 01/09/2021, 3:08 PM

## 2021-01-09 NOTE — Transfer of Care (Signed)
Immediate Anesthesia Transfer of Care Note  Patient: JASKIRAN PATA  Procedure(s) Performed: TOTAL KNEE ARTHROPLASTY (Right Knee)  Patient Location: PACU  Anesthesia Type:Spinal  Level of Consciousness: awake, alert , oriented and patient cooperative  Airway & Oxygen Therapy: Patient Spontanous Breathing and Patient connected to face mask oxygen  Post-op Assessment: Report given to RN and Post -op Vital signs reviewed and stable  Post vital signs: Reviewed and stable  Last Vitals:  Vitals Value Taken Time  BP 112/60 0954  Temp    Pulse 85 01/09/21 0953  Resp 14 01/09/21 0953  SpO2 96 % 01/09/21 0953  Vitals shown include unvalidated device data.  Last Pain:  Vitals:   01/09/21 0624  TempSrc:   PainSc: 3       Patients Stated Pain Goal: 4 (01/09/21 4469)  Complications: No complications documented.

## 2021-01-09 NOTE — Op Note (Signed)
OPERATIVE REPORT-TOTAL KNEE ARTHROPLASTY   Pre-operative diagnosis- Osteoarthritis  Right knee(s)  Post-operative diagnosis- Osteoarthritis Right knee(s)  Procedure-  Right  Total Knee Arthroplasty  Surgeon- Gus Rankin. Ronie Fleeger, MD  Assistant- Leilani Able, PA-C   Anesthesia-  Adductor canal block and spinal  EBL- 25 ml   Drains None  Tourniquet time-  Total Tourniquet Time Documented: Thigh (Right) - 37 minutes Total: Thigh (Right) - 37 minutes     Complications- None  Condition-PACU - hemodynamically stable.   Brief Clinical Note  Holly Herring is a 70 y.o. year old female with end stage OA of her right knee with progressively worsening pain and dysfunction. She has constant pain, with activity and at rest and significant functional deficits with difficulties even with ADLs. She has had extensive non-op management including analgesics, injections of cortisone, and home exercise program, but remains in significant pain with significant dysfunction.Radiographs show bone on bone arthritis medial and patellofemoral. She presents now for right Total Knee Arthroplasty.    Procedure in detail---   The patient is brought into the operating room and positioned supine on the operating table. After successful administration of  Adductor canal block and spinal,   a tourniquet is placed high on the  Right thigh(s) and the lower extremity is prepped and draped in the usual sterile fashion. Time out is performed by the operating team and then the  Right lower extremity is wrapped in Esmarch, knee flexed and the tourniquet inflated to 300 mmHg.       A midline incision is made with a ten blade through the subcutaneous tissue to the level of the extensor mechanism. A fresh blade is used to make a medial parapatellar arthrotomy. Soft tissue over the proximal medial tibia is subperiosteally elevated to the joint line with a knife and into the semimembranosus bursa with a Cobb elevator. Soft  tissue over the proximal lateral tibia is elevated with attention being paid to avoiding the patellar tendon on the tibial tubercle. The patella is everted, knee flexed 90 degrees and the ACL and PCL are removed. Findings are bone on bone medial and patellofemoral with large global osteophytes.        The drill is used to create a starting hole in the distal femur and the canal is thoroughly irrigated with sterile saline to remove the fatty contents. The 5 degree Right  valgus alignment guide is placed into the femoral canal and the distal femoral cutting block is pinned to remove 9 mm off the distal femur. Resection is made with an oscillating saw.      The tibia is subluxed forward and the menisci are removed. The extramedullary alignment guide is placed referencing proximally at the medial aspect of the tibial tubercle and distally along the second metatarsal axis and tibial crest. The block is pinned to remove 23mm off the more deficient medial  side. Resection is made with an oscillating saw. Size 3is the most appropriate size for the tibia and the proximal tibia is prepared with the modular drill and keel punch for that size.      The femoral sizing guide is placed and size 4 is most appropriate. Rotation is marked off the epicondylar axis and confirmed by creating a rectangular flexion gap at 90 degrees. The size 4 cutting block is pinned in this rotation and the anterior, posterior and chamfer cuts are made with the oscillating saw. The intercondylar block is then placed and that cut is made.  Trial size 3 tibial component, trial size 4 narrow posterior stabilized femur and a 10  mm posterior stabilized rotating platform insert trial is placed. Full extension is achieved with excellent varus/valgus and anterior/posterior balance throughout full range of motion. The patella is everted and thickness measured to be 22  mm. Free hand resection is taken to 12 mm, a 35 template is placed, lug holes are  drilled, trial patella is placed, and it tracks normally. Osteophytes are removed off the posterior femur with the trial in place. All trials are removed and the cut bone surfaces prepared with pulsatile lavage. Cement is mixed and once ready for implantation, the size 3 tibial implant, size  4 narrow posterior stabilized femoral component, and the size 35 patella are cemented in place and the patella is held with the clamp. The trial insert is placed and the knee held in full extension. The Exparel (20 ml mixed with 60 ml saline) is injected into the extensor mechanism, posterior capsule, medial and lateral gutters and subcutaneous tissues.  All extruded cement is removed and once the cement is hard the permanent 10 mm posterior stabilized rotating platform insert is placed into the tibial tray.      The wound is copiously irrigated with saline solution and the extensor mechanism closed with # 0 Stratofix suture. The tourniquet is released for a total tourniquet time of 37  minutes. Flexion against gravity is 140 degrees and the patella tracks normally. Subcutaneous tissue is closed with 2.0 vicryl and subcuticular with running 4.0 Monocryl. The incision is cleaned and dried and steri-strips and a bulky sterile dressing are applied. The limb is placed into a knee immobilizer and the patient is awakened and transported to recovery in stable condition.      Please note that a surgical assistant was a medical necessity for this procedure in order to perform it in a safe and expeditious manner. Surgical assistant was necessary to retract the ligaments and vital neurovascular structures to prevent injury to them and also necessary for proper positioning of the limb to allow for anatomic placement of the prosthesis.   Gus Rankin Kylie Gros, MD    01/09/2021, 9:26 AM

## 2021-01-09 NOTE — Discharge Instructions (Signed)
 Frank Aluisio, MD Total Joint Specialist [facility] [address] [place], [place] [address] [phone]  TOTAL KNEE REPLACEMENT POSTOPERATIVE DIRECTIONS    Knee Rehabilitation, Guidelines Following Surgery  Results after knee surgery are often greatly improved when you follow the exercise, range of motion and muscle strengthening exercises prescribed by your doctor. Safety measures are also important to protect the knee from further injury. If any of these exercises cause you to have increased pain or swelling in your knee joint, decrease the amount until you are comfortable again and slowly increase them. If you have problems or questions, call your caregiver or physical therapist for advice.   BLOOD CLOT PREVENTION . Take a 325 mg Aspirin two times a day for three weeks following surgery. Then resume one 81 mg Aspirin once a day. . You may resume your vitamins/supplements upon discharge from the hospital. . Do not take any NSAIDs (Advil, Aleve, Ibuprofen, Meloxicam, etc.) until you have discontinued the 325 mg Aspirin.  HOME CARE INSTRUCTIONS  . Remove items at home which could result in a fall. This includes throw rugs or furniture in walking pathways.  . ICE to the affected knee as much as tolerated. Icing helps control swelling. If the swelling is well controlled you will be more comfortable and rehab easier. Continue to use ice on the knee for pain and swelling from surgery. You may notice swelling that will progress down to the foot and ankle. This is normal after surgery. Elevate the leg when you are not up walking on it.    . Continue to use the breathing machine which will help keep your temperature down. It is common for your temperature to cycle up and down following surgery, especially at night when you are not up moving around and exerting yourself. The breathing machine keeps your lungs expanded and your temperature down. . Do not place pillow  under the operative knee, focus on keeping the knee straight while resting  DIET You may resume your previous home diet once you are discharged from the hospital.  DRESSING / WOUND CARE / SHOWERING . Keep your bulky bandage on for 2 days. On the third post-operative day you may remove the Ace bandage and gauze. There is a waterproof adhesive bandage on your skin which will stay in place until your first follow-up appointment. Once you remove this you will not need to place another bandage . You may begin showering 3 days following surgery, but do not submerge the incision under water.  ACTIVITY For the first 5 days, the key is rest and control of pain and swelling . Do your home exercises twice a day starting on post-operative day 3. On the days you go to physical therapy, just do the home exercises once that day. . You should rest, ice and elevate the leg for 50 minutes out of every hour. Get up and walk/stretch for 10 minutes per hour. After 5 days you can increase your activity slowly as tolerated. . Walk with your walker as instructed. Use the walker until you are comfortable transitioning to a cane. Walk with the cane in the opposite hand of the operative leg. You may discontinue the cane once you are comfortable and walking steadily. . Avoid periods of inactivity such as sitting longer than an hour when not asleep. This helps prevent blood clots.  . You may discontinue the knee immobilizer once you are able to perform a straight leg raise while lying down. .   You may resume a sexual relationship in one month or when given the OK by your doctor.  . You may return to work once you are cleared by your doctor.  . Do not drive a car for 6 weeks or until released by your surgeon.  . Do not drive while taking narcotics.  TED HOSE STOCKINGS Wear the elastic stockings on both legs for three weeks following surgery during the day. You may remove them at night for sleeping.  WEIGHT BEARING Weight  bearing as tolerated with assist device (walker, cane, etc) as directed, use it as long as suggested by your surgeon or therapist, typically at least 4-6 weeks.  POSTOPERATIVE CONSTIPATION PROTOCOL Constipation - defined medically as fewer than three stools per week and severe constipation as less than one stool per week.  One of the most common issues patients have following surgery is constipation.  Even if you have a regular bowel pattern at home, your normal regimen is likely to be disrupted due to multiple reasons following surgery.  Combination of anesthesia, postoperative narcotics, change in appetite and fluid intake all can affect your bowels.  In order to avoid complications following surgery, here are some recommendations in order to help you during your recovery period.  . Colace (docusate) - Pick up an over-the-counter form of Colace or another stool softener and take twice a day as long as you are requiring postoperative pain medications.  Take with a full glass of water daily.  If you experience loose stools or diarrhea, hold the colace until you stool forms back up. If your symptoms do not get better within 1 week or if they get worse, check with your doctor. . Dulcolax (bisacodyl) - Pick up over-the-counter and take as directed by the product packaging as needed to assist with the movement of your bowels.  Take with a full glass of water.  Use this product as needed if not relieved by Colace only.  . MiraLax (polyethylene glycol) - Pick up over-the-counter to have on hand. MiraLax is a solution that will increase the amount of water in your bowels to assist with bowel movements.  Take as directed and can mix with a glass of water, juice, soda, coffee, or tea. Take if you go more than two days without a movement. Do not use MiraLax more than once per day. Call your doctor if you are still constipated or irregular after using this medication for 7 days in a row.  If you continue to have  problems with postoperative constipation, please contact the office for further assistance and recommendations.  If you experience "the worst abdominal pain ever" or develop nausea or vomiting, please contact the office immediatly for further recommendations for treatment.  ITCHING If you experience itching with your medications, try taking only a single pain pill, or even half a pain pill at a time.  You can also use Benadryl over the counter for itching or also to help with sleep.   MEDICATIONS See your medication summary on the "After Visit Summary" that the nursing staff will review with you prior to discharge.  You may have some home medications which will be placed on hold until you complete the course of blood thinner medication.  It is important for you to complete the blood thinner medication as prescribed by your surgeon.  Continue your approved medications as instructed at time of discharge.  PRECAUTIONS . If you experience chest pain or shortness of breath - call 911 immediately for   transfer to the hospital emergency department.  . If you develop a fever greater that 101 F, purulent drainage from wound, increased redness or drainage from wound, foul odor from the wound/dressing, or calf pain - CONTACT YOUR SURGEON.                                                   FOLLOW-UP APPOINTMENTS Make sure you keep all of your appointments after your operation with your surgeon and caregivers. You should call the office at the above phone number and make an appointment for approximately two weeks after the date of your surgery or on the date instructed by your surgeon outlined in the "After Visit Summary".  RANGE OF MOTION AND STRENGTHENING EXERCISES  Rehabilitation of the knee is important following a knee injury or an operation. After just a few days of immobilization, the muscles of the thigh which control the knee become weakened and shrink (atrophy). Knee exercises are designed to build up the  tone and strength of the thigh muscles and to improve knee motion. Often times heat used for twenty to thirty minutes before working out will loosen up your tissues and help with improving the range of motion but do not use heat for the first two weeks following surgery. These exercises can be done on a training (exercise) mat, on the floor, on a table or on a bed. Use what ever works the best and is most comfortable for you Knee exercises include:  . Leg Lifts - While your knee is still immobilized in a splint or cast, you can do straight leg raises. Lift the leg to 60 degrees, hold for 3 sec, and slowly lower the leg. Repeat 10-20 times 2-3 times daily. Perform this exercise against resistance later as your knee gets better.  . Quad and Hamstring Sets - Tighten up the muscle on the front of the thigh (Quad) and hold for 5-10 sec. Repeat this 10-20 times hourly. Hamstring sets are done by pushing the foot backward against an object and holding for 5-10 sec. Repeat as with quad sets.   Leg Slides: Lying on your back, slowly slide your foot toward your buttocks, bending your knee up off the floor (only go as far as is comfortable). Then slowly slide your foot back down until your leg is flat on the floor again.  Angel Wings: Lying on your back spread your legs to the side as far apart as you can without causing discomfort.  A rehabilitation program following serious knee injuries can speed recovery and prevent re-injury in the future due to weakened muscles. Contact your doctor or a physical therapist for more information on knee rehabilitation.   IF YOU ARE TRANSFERRED TO A SKILLED REHAB FACILITY If the patient is transferred to a skilled rehab facility following release from the hospital, a list of the current medications will be sent to the facility for the patient to continue.  When discharged from the skilled rehab facility, please have the facility set up the patient's Home Health Physical Therapy  prior to being released. Also, the skilled facility will be responsible for providing the patient with their medications at time of release from the facility to include their pain medication, the muscle relaxants, and their blood thinner medication. If the patient is still at the rehab facility at time of the   two week follow up appointment, the skilled rehab facility will also need to assist the patient in arranging follow up appointment in our office and any transportation needs.  MAKE SURE YOU:  . Understand these instructions.  . Get help right away if you are not doing well or get worse.   DENTAL ANTIBIOTICS:  In most cases prophylactic antibiotics for Dental procdeures after total joint surgery are not necessary.  Exceptions are as follows:  1. History of prior total joint infection  2. Severely immunocompromised (Organ Transplant, cancer chemotherapy, Rheumatoid biologic meds such as Humera)  3. Poorly controlled diabetes (A1C &gt; 8.0, blood glucose over 200)  If you have one of these conditions, contact your surgeon for an antibiotic prescription, prior to your dental procedure.    Pick up stool softner and laxative for home use following surgery while on pain medications. Do not submerge incision under water. Please use good hand washing techniques while changing dressing each day. May shower starting three days after surgery. Please use a clean towel to pat the incision dry following showers. Continue to use ice for pain and swelling after surgery. Do not use any lotions or creams on the incision until instructed by your surgeon.  

## 2021-01-09 NOTE — Plan of Care (Signed)
Discussed with patient and husband about plan of care for post-op day 0. ° ° °Will continue to monitor patient.  ° ° °SWhittemore, RN ° °

## 2021-01-09 NOTE — Anesthesia Procedure Notes (Signed)
Spinal  Patient location during procedure: OR Start time: 01/09/2021 8:13 AM End time: 01/09/2021 8:24 AM Staffing Anesthesiologist: Eilene Ghazi, MD Preanesthetic Checklist Completed: patient identified, IV checked, site marked, risks and benefits discussed, surgical consent, monitors and equipment checked, pre-op evaluation and timeout performed Spinal Block Patient position: sitting Prep: DuraPrep Patient monitoring: heart rate, cardiac monitor, continuous pulse ox and blood pressure Approach: midline Location: L3-4 Injection technique: single-shot Needle Needle type: Sprotte  Needle gauge: 24 G Needle length: 9 cm Assessment Sensory level: T6 Additional Notes crna attempt x 2 I repositioned needle 21mm to right with success

## 2021-01-09 NOTE — Interval H&P Note (Signed)
History and Physical Interval Note:  01/09/2021 6:34 AM  Holly Herring  has presented today for surgery, with the diagnosis of right knee osteoarthritis.  The various methods of treatment have been discussed with the patient and family. After consideration of risks, benefits and other options for treatment, the patient has consented to  Procedure(s) with comments: TOTAL KNEE ARTHROPLASTY (Right) - as a surgical intervention.  The patient's history has been reviewed, patient examined, no change in status, stable for surgery.  I have reviewed the patient's chart and labs.  Questions were answered to the patient's satisfaction.     Homero Fellers Deandrea Rion  History and Physical Interval Note:  01/09/2021 6:33 AM  Holly Herring  has presented today for surgery, with the diagnosis of right knee osteoarthritis.  The various methods of treatment have been discussed with the patient and family. After consideration of risks, benefits and other options for treatment, the patient has consented to  Procedure(s) with comments: TOTAL KNEE ARTHROPLASTY (Right) - as a surgical intervention.  The patient's history has been reviewed, patient examined, no change in status, stable for surgery.  I have reviewed the patient's chart and labs.  Questions were answered to the patient's satisfaction.     Homero Fellers Sriya Kroeze

## 2021-01-10 ENCOUNTER — Encounter (HOSPITAL_COMMUNITY): Payer: Self-pay | Admitting: Orthopedic Surgery

## 2021-01-10 DIAGNOSIS — Z87891 Personal history of nicotine dependence: Secondary | ICD-10-CM | POA: Diagnosis not present

## 2021-01-10 DIAGNOSIS — M25761 Osteophyte, right knee: Secondary | ICD-10-CM | POA: Diagnosis not present

## 2021-01-10 DIAGNOSIS — Z880 Allergy status to penicillin: Secondary | ICD-10-CM | POA: Diagnosis not present

## 2021-01-10 DIAGNOSIS — Z885 Allergy status to narcotic agent status: Secondary | ICD-10-CM | POA: Diagnosis not present

## 2021-01-10 DIAGNOSIS — Z79899 Other long term (current) drug therapy: Secondary | ICD-10-CM | POA: Diagnosis not present

## 2021-01-10 DIAGNOSIS — Z7982 Long term (current) use of aspirin: Secondary | ICD-10-CM | POA: Diagnosis not present

## 2021-01-10 DIAGNOSIS — M1711 Unilateral primary osteoarthritis, right knee: Secondary | ICD-10-CM | POA: Diagnosis not present

## 2021-01-10 LAB — BASIC METABOLIC PANEL
Anion gap: 9 (ref 5–15)
BUN: 12 mg/dL (ref 8–23)
CO2: 26 mmol/L (ref 22–32)
Calcium: 8.7 mg/dL — ABNORMAL LOW (ref 8.9–10.3)
Chloride: 101 mmol/L (ref 98–111)
Creatinine, Ser: 0.79 mg/dL (ref 0.44–1.00)
GFR, Estimated: >60 mL/min (ref >60)
Glucose, Bld: 131 mg/dL — ABNORMAL HIGH (ref 70–99)
Potassium: 3.5 mmol/L (ref 3.5–5.1)
Sodium: 136 mmol/L (ref 135–145)

## 2021-01-10 LAB — CBC
HCT: 32.4 % — ABNORMAL LOW (ref 36.0–46.0)
Hemoglobin: 10.4 g/dL — ABNORMAL LOW (ref 12.0–15.0)
MCH: 28 pg (ref 26.0–34.0)
MCHC: 32.1 g/dL (ref 30.0–36.0)
MCV: 87.3 fL (ref 80.0–100.0)
Platelets: 287 10*3/uL (ref 150–400)
RBC: 3.71 MIL/uL — ABNORMAL LOW (ref 3.87–5.11)
RDW: 14.6 % (ref 11.5–15.5)
WBC: 9.7 10*3/uL (ref 4.0–10.5)
nRBC: 0 % (ref 0.0–0.2)

## 2021-01-10 MED ORDER — ASPIRIN 325 MG PO TBEC: 325.0000 mg | DELAYED_RELEASE_TABLET | Freq: Two times a day (BID) | ORAL | 0 refills | Status: AC

## 2021-01-10 MED ORDER — HYDROMORPHONE HCL 2 MG PO TABS: 2.0000 mg | ORAL_TABLET | Freq: Four times a day (QID) | ORAL | 0 refills | Status: DC | PRN

## 2021-01-10 MED ORDER — METHOCARBAMOL 500 MG PO TABS: 500.0000 mg | ORAL_TABLET | Freq: Four times a day (QID) | ORAL | 0 refills | Status: DC | PRN

## 2021-01-10 MED ORDER — GABAPENTIN 100 MG PO CAPS: ORAL_CAPSULE | ORAL | 0 refills | Status: DC

## 2021-01-10 NOTE — Plan of Care (Signed)
  Problem: Pain Management: Goal: Pain level will decrease with appropriate interventions 01/10/2021 0100 by Towanda Malkin, RN Outcome: Progressing 01/10/2021 0100 by Towanda Malkin, RN Outcome: Progressing   Problem: Education: Goal: Knowledge of General Education information will improve Description: Including pain rating scale, medication(s)/side effects and non-pharmacologic comfort measures Outcome: Progressing

## 2021-01-10 NOTE — Progress Notes (Signed)
Orthopedic Tech Progress Note Patient Details:  Holly Herring [birth year] [id number]  CPM Right Knee CPM Right Knee: Off Right Knee Flexion (Degrees): 40 Right Knee Extension (Degrees): 10 Additional Comments: picked up CPM machine  Post Interventions Patient Tolerated: Well Instructions Provided: Care of device  Saul Fordyce 01/10/2021, 7:27 AM

## 2021-01-10 NOTE — TOC Transition Note (Signed)
Transition of Care [facility]) - CM/SW Discharge Note   Patient Details  Name: Holly Herring MRN: [id number] Date of Birth: 1951/05/31  Transition of Care [facility]) CM/SW Contact:  Lennart Pall, LCSW Phone Number: 01/10/2021, 10:25 AM   Clinical Narrative:    Met briefly with pt who confirms she has all needed DME.  Planning for OPPT at [facility] in [place].  No further TOC needs.   Final next level of care: OP Rehab Barriers to Discharge: No Barriers Identified   Patient Goals and CMS Choice Patient states their goals for this hospitalization and ongoing recovery are:: go home      Discharge Placement                       Discharge Plan and Services                DME Arranged: N/A DME Agency: NA                  Social Determinants of Health (SDOH) Interventions     Readmission Risk Interventions No flowsheet data found.

## 2021-01-10 NOTE — Progress Notes (Signed)
   Subjective: 1 Day Post-Op Procedure(s) (LRB): TOTAL KNEE ARTHROPLASTY (Right) Patient reports pain as mild.   Patient seen in rounds by Dr. Lequita Halt. Patient is well, and has had no acute complaints or problems other than soreness in the right knee. Denies chest pain, SOB, or calf pain. No issues overnight. Foley catheter removed this AM. We will continue therapy today, ambulated 42' yesterday.  Objective: Vital signs in last 24 hours: Temp:  [97.6 F (36.4 C)-98.6 F (37 C)] 97.9 F (36.6 C) (02/15 0631) Pulse Rate:  [58-93] 69 (02/15 0631) Resp:  [9-20] 16 (02/15 0631) BP: (112-163)/(59-90) 153/85 (02/15 0631) SpO2:  [94 %-100 %] 99 % (02/15 0631)  Intake/Output from previous day:  Intake/Output Summary (Last 24 hours) at 01/10/2021 0708 Last data filed at 01/10/2021 0600 Gross per 24 hour  Intake 3464.25 ml  Output 1360 ml  Net 2104.25 ml     Intake/Output this shift: No intake/output data recorded.  Labs: Recent Labs    01/10/21 0331  HGB 10.4*   Recent Labs    01/10/21 0331  WBC 9.7  RBC 3.71*  HCT 32.4*  PLT 287   Recent Labs    01/10/21 0331  NA 136  K 3.5  CL 101  CO2 26  BUN 12  CREATININE 0.79  GLUCOSE 131*  CALCIUM 8.7*   No results for input(s): LABPT, INR in the last 72 hours.  Exam: General - Patient is Alert and Oriented Extremity - Neurologically intact Neurovascular intact Sensation intact distally Dorsiflexion/Plantar flexion intact Dressing - dressing C/D/I Motor Function - intact, moving foot and toes well on exam.   Past Medical History:  Diagnosis Date  . Arthritis   . Hypertension   . Uterine prolapse    had a Gelhorn pessary implant    Assessment/Plan: 1 Day Post-Op Procedure(s) (LRB): TOTAL KNEE ARTHROPLASTY (Right) Active Problems:   OA (osteoarthritis) of knee  Estimated body mass index is 36.7 kg/m as calculated from the following:   Height as of this encounter: 5\' 1"  (1.549 m).   Weight as of this  encounter: 88.1 kg. Advance diet Up with therapy D/C IV fluids  Patient's anticipated LOS is less than 2 midnights, meeting these requirements: - Younger than 79 - Lives within 1 hour of care - Has a competent adult at home to recover with post-op recover - NO history of  - Chronic pain requiring opiods  - Diabetes  - Coronary Artery Disease  - Heart failure  - Heart attack  - Stroke  - DVT/VTE  - Cardiac arrhythmia  - Respiratory Failure/COPD  - Renal failure  - Anemia  - Advanced Liver disease  DVT Prophylaxis - Aspirin Weight bearing as tolerated. Continue therapy.  Plan is to go Home after hospital stay. Plan for discharge after one session of physical therapy if meeting goals. Scheduled for OPP[facility] at [facility]) Follow-up in the office March 1st.  The PDMP database was reviewed today prior to any opioid medications being prescribed to this patient.  04-26-1973, PA-C Orthopedic Surgery [phone] 01/10/2021, 7:08 AM

## 2021-01-10 NOTE — Progress Notes (Signed)
RN reviewed discharge instructions with patient and family. All questions answered.   Paperwork given. Prescriptions electronically sent to patient pharmacy.    NT rolled patient down with all belongings to family car.     Ifeanyichukwu Wickham, RN  

## 2021-01-10 NOTE — Progress Notes (Signed)
Physical Therapy Treatment Patient Details Name: Holly Herring MRN: [id number] DOB: 06/11/1951 Today's Date: 01/10/2021    History of Present Illness s/p R TKA    PT Comments    Pt progressing well.  Reviewed mobility areas below. Reviewed HEP, activity at home until OPPT. Pt pain and nausea improved. Ready to d/c with family assist prn. Pt is very independent at her baseline, motivated and should continue to progress well.  Follow Up Recommendations  Follow surgeon's recommendation for DC plan and follow-up therapies;Outpatient PT     Equipment Recommendations  None recommended by PT    Recommendations for Other Services       Precautions / Restrictions Precautions Precautions: Fall;Knee Required Braces or Orthoses: Knee Immobilizer - Right Knee Immobilizer - Right: Discontinue once straight leg raise with < 10 degree lag Restrictions Weight Bearing Restrictions: No RLE Weight Bearing: Weight bearing as tolerated Other Position/Activity Restrictions: WBAT    Mobility  Bed Mobility Overal bed mobility: Needs Assistance Bed Mobility: Supine to Sit;Sit to Supine     Supine to sit: Min guard;Min assist Sit to supine: Min guard;Min assist   General bed mobility comments: light assist with RLE d/t incr pain, decr quad activation    Transfers Overall transfer level: Needs assistance Equipment used: Rolling walker (2 wheeled) Transfers: Sit to/from Stand Sit to Stand: Min guard;Supervision         General transfer comment: self cues for hand placement and to power up with LLE, position RLE  Ambulation/Gait Ambulation/Gait assistance: Supervision Gait Distance (Feet): 60 Feet Assistive device: Rolling walker (2 wheeled) Gait Pattern/deviations: Step-to pattern;Decreased stance time - right     General Gait Details: cues for initial sequence   Stairs             Wheelchair Mobility    Modified Rankin (Stroke Patients Only)       Balance  Overall balance assessment: Mild deficits observed, not formally tested                                          Cognition Arousal/Alertness: Awake/alert Behavior During Therapy: WFL for tasks assessed/performed Overall Cognitive Status: Within Functional Limits for tasks assessed                                        Exercises Total Joint Exercises Ankle Circles/Pumps: AROM;Both;10 reps Quad Sets: AROM;Right;10 reps Heel Slides: AAROM;Right;10 reps Hip ABduction/ADduction: AROM;AAROM;Right;10 reps Straight Leg Raises: Right;10 reps;AAROM Goniometric ROM: ~8 to 55 degrees R knee flexion AAROM    General Comments        Pertinent Vitals/Pain Pain Assessment: 0-10 Pain Score: 4  Pain Location: right knee Pain Descriptors / Indicators: Discomfort;Grimacing;Sore;Tightness Pain Intervention(s): Limited activity within patient's tolerance;Monitored during session;Repositioned;Premedicated before session;Ice applied    Home Living                      Prior Function            PT Goals (current goals can now be found in the care plan section) Acute Rehab PT Goals Patient Stated Goal: knee better and get back to being IND PT Goal Formulation: With patient Time For Goal Achievement: 01/16/21 Potential to Achieve Goals: Good Progress towards PT goals: Progressing toward goals  Frequency    7X/week      PT Plan Current plan remains appropriate    Co-evaluation              AM-PAC PT "6 Clicks" Mobility   Outcome Measure  Help needed turning from your back to your side while in a flat bed without using bedrails?: A Little Help needed moving from lying on your back to sitting on the side of a flat bed without using bedrails?: A Little Help needed moving to and from a bed to a chair (including a wheelchair)?: A Little Help needed standing up from a chair using your arms (e.g., wheelchair or bedside chair)?: A  Little Help needed to walk in hospital room?: A Little Help needed climbing 3-5 steps with a railing? : A Little 6 Click Score: 18    End of Session Equipment Utilized During Treatment: Gait belt Activity Tolerance: Patient tolerated treatment well Patient left: in bed;with call bell/phone within reach;with bed alarm set Nurse Communication: Mobility status PT Visit Diagnosis: Other abnormalities of gait and mobility (R26.89)     Time: 2334-3568 PT Time Calculation (min) (ACUTE ONLY): 35 min  Charges:  $Gait Training: 8-22 mins $Therapeutic Exercise: 8-22 mins                     Delice Bison, PT  Acute Rehab Dept ([facility]) [phone] Pager [phone]  01/10/2021    [facility] 01/10/2021, 11:16 AM

## 2021-01-12 ENCOUNTER — Other Ambulatory Visit: Payer: Self-pay

## 2021-01-12 ENCOUNTER — Ambulatory Visit: Payer: Medicare Other | Attending: Orthopedic Surgery

## 2021-01-12 DIAGNOSIS — M6281 Muscle weakness (generalized): Secondary | ICD-10-CM | POA: Diagnosis not present

## 2021-01-12 DIAGNOSIS — M25561 Pain in right knee: Secondary | ICD-10-CM

## 2021-01-12 DIAGNOSIS — M25562 Pain in left knee: Secondary | ICD-10-CM | POA: Diagnosis not present

## 2021-01-12 DIAGNOSIS — R2689 Other abnormalities of gait and mobility: Secondary | ICD-10-CM | POA: Diagnosis not present

## 2021-01-12 DIAGNOSIS — M25661 Stiffness of right knee, not elsewhere classified: Secondary | ICD-10-CM

## 2021-01-12 DIAGNOSIS — R600 Localized edema: Secondary | ICD-10-CM | POA: Diagnosis not present

## 2021-01-12 NOTE — Therapy (Signed)
[facility] [address]  [address] [place], [place], [address] Phone: [phone]   Fax:  [phone]  Physical Therapy Evaluation  Patient Details  Name: Holly Herring MRN: [id number] Date of Birth: Apr 14, 1951 Referring Provider (PT): Ollen Gross MD   Encounter Date: 01/12/2021   PT End of Session - 01/12/21 1159    Visit Number 1    Number of Visits 12    Date for PT Re-Evaluation 03/09/21    Progress Note Due on Visit --   Appointment with Surgeon Dr Lequita Halt on March 1st   PT Start Time 1100    PT Stop Time 1145    PT Time Calculation (min) 45 min    Activity Tolerance Patient tolerated treatment well    Behavior During Therapy [facility] for tasks assessed/performed           Past Medical History:  Diagnosis Date  . Arthritis   . Hypertension   . Uterine prolapse    had a Gelhorn pessary implant    Past Surgical History:  Procedure Laterality Date  . ABDOMINAL HYSTERECTOMY  2019  . BREAST SURGERY Bilateral    breast reduction  . donut     Gelhorn  pessary for uterine prolapse  . HERNIA REPAIR    . KNEE ARTHROSCOPY     Dr Hollice Gong  . OOPHORECTOMY     one ovary removed due to ectopic  . REDUCTION MAMMAPLASTY Bilateral 2002  . 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   Procedure: TOTAL KNEE ARTHROPLASTY;  Surgeon: Nestor Lewandowsky, MD;  Location: [facility] OR;  Service: Orthopedics;  Laterality: Left;  DEPUY/SIGMA  . TOTAL KNEE ARTHROPLASTY Right 01/09/2021   Procedure: TOTAL KNEE ARTHROPLASTY;  Surgeon: Ollen Gross, MD;  Location: [facility];  Service: Orthopedics;  Laterality: Right;     There were no vitals filed for this visit.    Subjective Assessment - 01/12/21 1109    Subjective R TKA this past monday 01/09/2021. Pt with some post op pain and swelling on the Right, some pain on the left leg as well since putting more weight on it. Is using ice brace at home and doing initial home exercises. Is very motivated  to get back to walking and overall independence    Pertinent History history of L TKA, HTN, uterine prolapse surgery    Limitations Walking    How long can you sit comfortably? 30 min to an hr    Patient Stated Goals to get back to PLOF, walking again    Currently in Pain? Yes    Pain Score 2     Pain Location Knee    Pain Orientation Right    Pain Descriptors / Indicators Sore    Pain Type Surgical pain;Acute pain    Pain Onset In the past 7 days    Pain Frequency Intermittent    Aggravating Factors  Waking up in the morning (stiff),    Pain Relieving Factors pain medicine, ice pack/brace    Effect of Pain on Daily Activities difficulty with balance, walking in and out of home, unable to assist with caregiving for husband (but reports she prepared pre op to accomodate current limitaitons)              [facility] PT Assessment - 01/12/21 0001      Assessment   Medical Diagnosis R TKA    Referring Provider (PT) Ollen Gross MD    Onset Date/Surgical Date 01/09/21  Hand Dominance Right    Next MD Visit 01/24/2021    Prior Therapy for L TKA 2273yrs ago,  few PT visits post op for this R TKA      Precautions   Required Braces or Orthoses Knee Immobilizer - Right   Has ice brace that she uses at home. Uses Knee immobilizer moreso to get out of the house for stability.  Per IP PT note: Knee Immobilizer - Right: Discontinue once straight leg raise with < 10 degree lag     Home Environment   Living Environment Private residence    Home Equipment Walker - 2 wheels    Additional Comments Lives with 68 yo husband who uses an Mining engineerelectric chair for primary mobility, Pt is husbands primary caregiver . Has daughter and friends that can help as needed. Level entry in and out of home with 1 floor.      Prior Function   Vocation Retired    GafferVocation Requirements Previous PT tech, Building surveyorWound care tech, Sleep lab tech    Leisure walking      Cognition   Overall Cognitive Status Within Functional Limits  for tasks assessed      Observation/Other Assessments   Observations R Knee swollen, red. Post op dressing donned.    Focus on Therapeutic Outcomes (FOTO)  to be assessed      Functional Tests   Functional tests Sit to Stand      Sit to Stand   Comments with BUE support, RLE slighlty extended and increased weight shift left      ROM / Strength   AROM / PROM / Strength AROM;Strength      AROM   AROM Assessment Site Knee    Right/Left Knee Right    Right Knee Extension -5    Right Knee Flexion 56   seated, 50 supine     Strength   Strength Assessment Site Knee;Hip;Ankle    Right/Left Hip Right;Left    Right Hip Flexion 3-/5   some pain   Right Hip ABduction 3+/5    Right Hip ADduction 3+/5    Left Hip Flexion 4/5    Left Hip ABduction 4/5    Left Hip ADduction 4/5    Right/Left Knee Right;Left    Right Knee Flexion 3+/5    Right Knee Extension 3/5    Left Knee Flexion 4+/5    Left Knee Extension 4+/5    Right/Left Ankle Right;Left    Right Ankle Dorsiflexion 4/5    Left Ankle Dorsiflexion 4+/5      Palpation   Palpation comment TTP R knee and lower leg      Ambulation/Gait   Ambulation/Gait Yes    Ambulation/Gait Assistance 6: Modified independent (Device/Increase time);5: Supervision    Ambulation/Gait Assistance Details FWW    Ambulation Distance (Feet) 100 Feet    Gait Pattern Step-through pattern;Decreased step length - right;Decreased stance time - right;Decreased hip/knee flexion - right;Decreased dorsiflexion - right;Decreased weight shift to right;Trendelenburg;Antalgic;Trunk flexed                      Objective measurements completed on examination: See above findings.               PT Education - 01/12/21 1129    Education Details Continue Hospital D/C HEP:  Ankle Circles/Pumps: AROM;Both;10 reps  Quad Sets: AROM;Right;10 reps  Heel Slides: AAROM;Right;10 reps  Hip ABduction/ADduction: AROM;AAROM;Right;10 reps  Straight Leg  Raises: Right;10 reps;AAROM  Person(s) Educated Patient    Methods Explanation;Demonstration    Comprehension Verbalized understanding;Returned demonstration            PT Short Term Goals - 01/12/21 1207      PT SHORT TERM GOAL #1   Title Pt will be independent with initial HEP    Time 2    Period Weeks    Status New    Target Date 01/26/21             PT Long Term Goals - 01/12/21 1208      PT LONG TERM GOAL #1   Title Pt will be independent with advanced HEP    Time 8    Period Weeks    Status New    Target Date 03/09/21      PT LONG TERM GOAL #2   Title Pt will demo R knee ROM to at least 0 - 115 deg with </= 2/10 pain    Time 8    Period Weeks    Status New    Target Date 03/09/21      PT LONG TERM GOAL #3   Title Pt will be able to ambulate at least 100 feet without AD, </=2/10 pain, and with normalized gait pattern    Time 8    Period Weeks    Target Date 03/09/21      PT LONG TERM GOAL #4   Title Pt will demo BLE strength >/= 4+/5    Time 8    Period Weeks    Status New    Target Date 03/09/21                  Plan - 01/12/21 1150    Clinical Impression Statement Pt is a 70 yo female 3 days s/p R TKA. She presents with decreased R knee ROM  -5 deg extension to 56 deg flexion, muscle weakness, TTP and swelling of the right knee. She is currently using Knee immobilizer from hospital for stability when walking out of the house. She has a history of L TKA and reports getting some pain/tiredness on that side as she is using it more. Gait is antalgic and asymmetrical, with FWW but pt able to complete short transfers without it using UE support. Discussed PT POC with patient and she shared she is on a fixed income and is only willing to do 2x/week therapy for about 3 weeks followed by going down to 1x/week. Hospital D/C HEP was reviewed and reinforced today as well.  Pt will benefit from skilled physical therapy to  improve ROM, strength, functional  mobility and balance to return to PLOF.    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;Stairs;Stand;Lift    Examination-Participation Restrictions Community Activity;Driving;Interpersonal Relationship    Stability/Clinical Decision Making Stable/Uncomplicated    Clinical Decision Making Low    Rehab Potential Good    PT Frequency Other (comment)   2x/week for 3 weeks, followed by 1x/week for 4-5 weeks   PT Duration 8 weeks    PT Treatment/Interventions ADLs/Self Care Home Management;Cryotherapy;Electrical Stimulation;Moist Heat;Gait training;Neuromuscular re-education;Balance training;Therapeutic exercise;Therapeutic activities;Functional mobility training;Stair training;Patient/family education;Manual techniques;Energy conservation;Taping;Vasopneumatic Device    PT Next Visit Plan Knee FOTO, Reassess hospital discharge HEP, Knee ROM and STR as tolerated. Manual and modalities as needed.    PT Home Exercise Plan see pt education    Consulted and Agree with Plan of Care Patient  Patient will benefit from skilled therapeutic intervention in order to improve the following deficits and impairments:  Abnormal gait,Decreased range of motion,Difficulty walking,Pain,Improper body mechanics,Impaired flexibility,Decreased balance,Decreased mobility,Decreased strength,Increased edema,Increased muscle spasms  Visit Diagnosis: Acute pain of right knee - Plan: PT plan of care cert/re-cert  Muscle weakness (generalized) - Plan: PT plan of care cert/re-cert  Stiffness of right knee, not elsewhere classified - Plan: PT plan of care cert/re-cert  Left knee pain, unspecified chronicity - Plan: PT plan of care cert/re-cert  Other abnormalities of gait and mobility - Plan: PT plan of care cert/re-cert  Localized edema - Plan: PT plan of care  cert/re-cert     Problem List Patient Active Problem List   Diagnosis Date Noted  . OA (osteoarthritis) of knee 01/09/2021  . Preventive measure 01/25/2016  . Numbness and tingling in hands 01/18/2015  . Former smoker, stopped smoking in distant past 01/18/2015  . Hypertension 12/26/2011  . Postmenopausal status, on HRT 12/26/2011  . Knee pain, s/p TKR LEFT 12/26/2011    Anson Crofts, PT, DPT 01/12/2021, 12:17 PM  [facility] [address]  [address] [place], [place], [address] Phone: [phone]   Fax:  [phone]  Name: Holly Herring MRN: [id number] Date of Birth: Jun 27, 1951

## 2021-01-16 ENCOUNTER — Other Ambulatory Visit: Payer: Self-pay

## 2021-01-16 ENCOUNTER — Ambulatory Visit: Payer: Medicare Other

## 2021-01-16 DIAGNOSIS — M25562 Pain in left knee: Secondary | ICD-10-CM | POA: Diagnosis not present

## 2021-01-16 DIAGNOSIS — M6281 Muscle weakness (generalized): Secondary | ICD-10-CM

## 2021-01-16 DIAGNOSIS — R2689 Other abnormalities of gait and mobility: Secondary | ICD-10-CM

## 2021-01-16 DIAGNOSIS — R600 Localized edema: Secondary | ICD-10-CM

## 2021-01-16 DIAGNOSIS — M25661 Stiffness of right knee, not elsewhere classified: Secondary | ICD-10-CM

## 2021-01-16 DIAGNOSIS — M25561 Pain in right knee: Secondary | ICD-10-CM

## 2021-01-16 NOTE — Therapy (Signed)
[facility] [address]  [address] [place], [place], [address] Phone: [phone]   Fax:  [phone]  Physical Therapy Treatment  Patient Details  Name: Holly Herring MRN: [id number] Date of Birth: [birth year] Referring Provider (PT): Ollen Gross MD   Encounter Date: 01/16/2021   PT End of Session - 01/16/21 0848    Visit Number 2    Number of Visits 12    Date for PT Re-Evaluation 03/09/21    PT Start Time 0800    PT Stop Time 0854    PT Time Calculation (min) 54 min    Activity Tolerance Patient tolerated treatment well    Behavior During Therapy [facility] for tasks assessed/performed           Past Medical History:  Diagnosis Date  . Arthritis   . Hypertension   . Uterine prolapse    had a Gelhorn pessary implant    Past Surgical History:  Procedure Laterality Date  . ABDOMINAL HYSTERECTOMY  2019  . BREAST SURGERY Bilateral    breast reduction  . donut     Gelhorn  pessary for uterine prolapse  . HERNIA REPAIR    . KNEE ARTHROSCOPY     Dr Hollice Gong  . OOPHORECTOMY     one ovary removed due to ectopic  . REDUCTION MAMMAPLASTY Bilateral 2002  . 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   Procedure: TOTAL KNEE ARTHROPLASTY;  Surgeon: Nestor Lewandowsky, MD;  Location: [facility] OR;  Service: Orthopedics;  Laterality: Left;  DEPUY/SIGMA  . TOTAL KNEE ARTHROPLASTY Right 01/09/2021   Procedure: TOTAL KNEE ARTHROPLASTY;  Surgeon: Ollen Gross, MD;  Location: [facility];  Service: Orthopedics;  Laterality: Right;     There were no vitals filed for this visit.   Subjective Assessment - 01/16/21 0801    Subjective Pt reports she does her home exercises 2-3 times per day, she feels she can go without the knee immobilizer    Pertinent History history of L TKA, HTN, uterine prolapse surgery    Patient Stated Goals to get back to PLOF, walking again    Currently in Pain? Yes    Pain Score 3     Pain Location Knee    Pain  Orientation Right;Anterior    Pain Descriptors / Indicators Sore    Pain Type Acute pain;Surgical pain              [facility] PT Assessment - 01/16/21 0001      Observation/Other Assessments   Focus on Therapeutic Outcomes (FOTO)  Pt score: 47, predicted score: 76                         [facility] Adult PT Treatment/Exercise - 01/16/21 0001      Exercises   Exercises Knee/Hip      Knee/Hip Exercises: Stretches   Active Hamstring Stretch Right;2 reps;30 seconds    Active Hamstring Stretch Limitations seated with strap      Knee/Hip Exercises: Aerobic   Nustep L2x5 min      Knee/Hip Exercises: Standing   Hip Flexion Stengthening;Both;1 set;10 reps    Hip Flexion Limitations marches walker for assistance      Knee/Hip Exercises: Seated   Long Arc Quad AROM;Right;1 set;10 reps    Knee/Hip Flexion knee flexion AAROM 10x5 sec    Other Seated Knee/Hip Exercises AP both 10x      Knee/Hip Exercises: Supine   Quad  Sets Strengthening;Right;1 set;10 reps    Quad Sets Limitations towel underneath knee; 5 sec hold    Heel Slides AAROM;Right;2 sets;10 reps    Heel Slides Limitations with strap    Straight Leg Raises Strengthening;Right;1 set;10 reps    Straight Leg Raises Limitations with QS      Modalities   Modalities Vasopneumatic      Vasopneumatic   Number Minutes Vasopneumatic  10 minutes    Vasopnuematic Location  Knee    Vasopneumatic Pressure Low    Vasopneumatic Temperature  34                    PT Short Term Goals - 01/12/21 1207      PT SHORT TERM GOAL #1   Title Pt will be independent with initial HEP    Time 2    Period Weeks    Status New    Target Date 01/26/21             PT Long Term Goals - 01/12/21 1208      PT LONG TERM GOAL #1   Title Pt will be independent with advanced HEP    Time 8    Period Weeks    Status New    Target Date 03/09/21      PT LONG TERM GOAL #2   Title Pt will demo R knee ROM to at least 0 - 115  deg with </= 2/10 pain    Time 8    Period Weeks    Status New    Target Date 03/09/21      PT LONG TERM GOAL #3   Title Pt will be able to ambulate at least 100 feet without AD, </=2/10 pain, and with normalized gait pattern    Time 8    Period Weeks    Target Date 03/09/21      PT LONG TERM GOAL #4   Title Pt will demo BLE strength >/= 4+/5    Time 8    Period Weeks    Status New    Target Date 03/09/21                 Plan - 01/16/21 0848    Clinical Impression Statement Pt responded well to treatment, she reported she had been doing the exercises prescribed from her hospital D/C. Reviewed patients HEP to ensure proper performance and added seated hamstring stretch and seated AAROM knee flexion to home exercises with written copies given to pt. Pt required cueing to prevent substitution movements with the hip muscles and to target the quad muscles for strengthening. Pt showed quad weakness during exercises and required tactile feedback to facilitate quad contraction.Educated pt on using ice and weaning out of knee immobilizer within future sessions. Game ready given post treatment to address swelling and soreness.    Personal Factors and Comorbidities Past/Current Experience;Comorbidity 2;Time since onset of injury/illness/exacerbation;Finances    PT Frequency Other (comment)    PT Duration 8 weeks    PT Treatment/Interventions ADLs/Self Care Home Management;Cryotherapy;Electrical Stimulation;Moist Heat;Gait training;Neuromuscular re-education;Balance training;Therapeutic exercise;Therapeutic activities;Functional mobility training;Stair training;Patient/family education;Manual techniques;Energy conservation;Taping;Vasopneumatic Device    PT Next Visit Plan work on knee ROM, quad strengthening, manual and modalities as needed    PT Home Exercise Plan see pt education    Consulted and Agree with Plan of Care Patient           Patient will benefit from skilled therapeutic  intervention in order to improve  the following deficits and impairments:  Abnormal gait,Decreased range of motion,Difficulty walking,Pain,Improper body mechanics,Impaired flexibility,Decreased balance,Decreased mobility,Decreased strength,Increased edema,Increased muscle spasms  Visit Diagnosis: Muscle weakness (generalized)  Acute pain of right knee  Stiffness of right knee, not elsewhere classified  Left knee pain, unspecified chronicity  Other abnormalities of gait and mobility  Localized edema     Problem List Patient Active Problem List   Diagnosis Date Noted  . OA (osteoarthritis) of knee 01/09/2021  . Preventive measure 01/25/2016  . Numbness and tingling in hands 01/18/2015  . Former smoker, stopped smoking in distant past 01/18/2015  . Hypertension 12/26/2011  . Postmenopausal status, on HRT 12/26/2011  . Knee pain, s/p TKR LEFT 12/26/2011    Darleene Cleaver, PTA 01/16/2021, 9:21 AM  [facility] [address]  [address] [place], [place], [address] Phone: [phone]   Fax:  [phone]  Name: Holly Herring MRN: [id number] Date of Birth: [birth year]

## 2021-01-19 ENCOUNTER — Other Ambulatory Visit: Payer: Self-pay

## 2021-01-19 ENCOUNTER — Ambulatory Visit: Payer: Medicare Other

## 2021-01-19 DIAGNOSIS — M6281 Muscle weakness (generalized): Secondary | ICD-10-CM

## 2021-01-19 DIAGNOSIS — M25561 Pain in right knee: Secondary | ICD-10-CM

## 2021-01-19 DIAGNOSIS — M25661 Stiffness of right knee, not elsewhere classified: Secondary | ICD-10-CM

## 2021-01-19 DIAGNOSIS — R2689 Other abnormalities of gait and mobility: Secondary | ICD-10-CM

## 2021-01-19 DIAGNOSIS — R600 Localized edema: Secondary | ICD-10-CM | POA: Diagnosis not present

## 2021-01-19 DIAGNOSIS — M25562 Pain in left knee: Secondary | ICD-10-CM | POA: Diagnosis not present

## 2021-01-19 NOTE — Therapy (Signed)
[facility] [address]  [address] [place], [place], [address] Phone: [phone]   Fax:  [phone]  Physical Therapy Treatment  Patient Details  Name: Holly Herring MRN: [id number] Date of Birth: 10/16/1951 Referring Provider (PT): Ollen Gross MD   Encounter Date: 01/19/2021   PT End of Session - 01/19/21 0842    Visit Number 3    Number of Visits 12    Date for PT Re-Evaluation 03/09/21    PT Start Time 0800    PT Stop Time 0847    PT Time Calculation (min) 47 min    Equipment Utilized During Treatment Gait belt    Activity Tolerance Patient tolerated treatment well    Behavior During Therapy [facility] for tasks assessed/performed           Past Medical History:  Diagnosis Date  . Arthritis   . Hypertension   . Uterine prolapse    had a Gelhorn pessary implant    Past Surgical History:  Procedure Laterality Date  . ABDOMINAL HYSTERECTOMY  2019  . BREAST SURGERY Bilateral    breast reduction  . donut     Gelhorn  pessary for uterine prolapse  . HERNIA REPAIR    . KNEE ARTHROSCOPY     Dr Hollice Gong  . OOPHORECTOMY     one ovary removed due to ectopic  . REDUCTION MAMMAPLASTY Bilateral 2002  . 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   Procedure: TOTAL KNEE ARTHROPLASTY;  Surgeon: Nestor Lewandowsky, MD;  Location: [facility];  Service: Orthopedics;  Laterality: Left;  DEPUY/SIGMA  . TOTAL KNEE ARTHROPLASTY Right 01/09/2021   Procedure: TOTAL KNEE ARTHROPLASTY;  Surgeon: Ollen Gross, MD;  Location: [facility];  Service: Orthopedics;  Laterality: Right;     There were no vitals filed for this visit.   Subjective Assessment - 01/19/21 0802    Subjective Pt reports she does her home exercises 2-3 times per day. Hasnt been doing as much walking past few days, focusing on trying to get swelling down in the knee. Seeing Dr Lequita Halt again on the 1st    Pertinent History history of L TKA, HTN, uterine prolapse surgery     Limitations Walking    How long can you sit comfortably? 30 min to an hr    Patient Stated Goals to get back to PLOF, walking again    Currently in Pain? Yes    Pain Score 7     Pain Location Knee    Pain Orientation Right;Anterior    Pain Descriptors / Indicators Sore                             [facility] Adult PT Treatment/Exercise - 01/19/21 0001      Knee/Hip Exercises: Stretches   Active Hamstring Stretch Right;4 reps;30 seconds    Active Hamstring Stretch Limitations --   Seated with heel on floor, hip hinge     Knee/Hip Exercises: Aerobic   Nustep L2x6 min      Knee/Hip Exercises: Standing   Hip Flexion Stengthening;Both;10 reps;2 sets    Hip Flexion Limitations marches walker for assistance   "feels much better today"     Knee/Hip Exercises: Seated   Long Arc Quad AROM;Right;1 set;10 reps   with 3" quad set/hold at end   Knee/Hip Flexion knee flexion AAROM 10x5 sec      Knee/Hip Exercises: Supine   Quad Sets  Strengthening;Right;1 set;10 reps    Quad Sets Limitations towel underneath knee; 5 sec hold    Heel Slides AAROM;Right;10 reps;3 sets    Heel Slides Limitations with strap   x10 with towel under foot, 2x 10 with lgs on pball   Straight Leg Raises Strengthening;Right;1 set;10 reps    Straight Leg Raises Limitations with QS   Flexion x10 and abd x10 supine     Modalities   Modalities Vasopneumatic      Vasopneumatic   Number Minutes Vasopneumatic  10 minutes    Vasopnuematic Location  Knee    Vasopneumatic Pressure Low    Vasopneumatic Temperature  34      Manual Therapy   Manual Therapy Soft tissue mobilization;Myofascial release    Manual therapy comments RIGHT VMO, distal vastus lateralis. Some palpable ms spasm, responded well to sustained pressure and STM                    PT Short Term Goals - 01/12/21 1207      PT SHORT TERM GOAL #1   Title Pt will be independent with initial HEP    Time 2    Period Weeks    Status  New    Target Date 01/26/21             PT Long Term Goals - 01/12/21 1208      PT LONG TERM GOAL #1   Title Pt will be independent with advanced HEP    Time 8    Period Weeks    Status New    Target Date 03/09/21      PT LONG TERM GOAL #2   Title Pt will demo R knee ROM to at least 0 - 115 deg with </= 2/10 pain    Time 8    Period Weeks    Status New    Target Date 03/09/21      PT LONG TERM GOAL #3   Title Pt will be able to ambulate at least 100 feet without AD, </=2/10 pain, and with normalized gait pattern    Time 8    Period Weeks    Target Date 03/09/21      PT LONG TERM GOAL #4   Title Pt will demo BLE strength >/= 4+/5    Time 8    Period Weeks    Status New    Target Date 03/09/21                 Plan - 01/19/21 0806    Clinical Impression Statement Pt continues to respond nicely to treatment. Able to reverse demo new exxercises from previous session well. Doing well with quad setting with straight leg raises but continued tightness into flexion. Difficulty with heel slides with strap supine due to tightness and trialed with legs on pball with slight improvement in ROM excursion and tolerance. 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;Stairs;Stand;Lift    Examination-Participation Restrictions Community Activity;Driving;Interpersonal Relationship    Stability/Clinical Decision Making Stable/Uncomplicated    Rehab Potential Good    PT Duration 8 weeks    PT Treatment/Interventions ADLs/Self Care Home Management;Cryotherapy;Electrical Stimulation;Moist Heat;Gait training;Neuromuscular re-education;Balance training;Therapeutic exercise;Therapeutic activities;Functional mobility training;Stair training;Patient/family education;Manual techniques;Energy  conservation;Taping;Vasopneumatic Device    PT Next Visit Plan work on knee ROM, quad strengthening, manual and modalities as needed. Seeing MD March 1st  PT Home Exercise Plan see pt education    Consulted and Agree with Plan of Care Patient           Patient will benefit from skilled therapeutic intervention in order to improve the following deficits and impairments:  Abnormal gait,Decreased range of motion,Difficulty walking,Pain,Improper body mechanics,Impaired flexibility,Decreased balance,Decreased mobility,Decreased strength,Increased edema,Increased muscle spasms  Visit Diagnosis: Muscle weakness (generalized)  Acute pain of right knee  Stiffness of right knee, not elsewhere classified  Left knee pain, unspecified chronicity  Other abnormalities of gait and mobility  Localized edema     Problem List Patient Active Problem List   Diagnosis Date Noted  . OA (osteoarthritis) of knee 01/09/2021  . Preventive measure 01/25/2016  . Numbness and tingling in hands 01/18/2015  . Former smoker, stopped smoking in distant past 01/18/2015  . Hypertension 12/26/2011  . Postmenopausal status, on HRT 12/26/2011  . Knee pain, s/p TKR LEFT 12/26/2011    Anson Crofts, PT, DPT 01/19/2021, 8:45 AM  [facility] [address]  [address] [place], [place], [address] Phone: [phone]   Fax:  [phone]  Name: TYISHA CRESSY MRN: [id number] Date of Birth: October 04, 1951

## 2021-01-23 ENCOUNTER — Other Ambulatory Visit: Payer: Self-pay

## 2021-01-23 ENCOUNTER — Ambulatory Visit: Payer: Medicare Other

## 2021-01-23 DIAGNOSIS — M25661 Stiffness of right knee, not elsewhere classified: Secondary | ICD-10-CM

## 2021-01-23 DIAGNOSIS — M25562 Pain in left knee: Secondary | ICD-10-CM | POA: Diagnosis not present

## 2021-01-23 DIAGNOSIS — M6281 Muscle weakness (generalized): Secondary | ICD-10-CM

## 2021-01-23 DIAGNOSIS — R600 Localized edema: Secondary | ICD-10-CM | POA: Diagnosis not present

## 2021-01-23 DIAGNOSIS — R2689 Other abnormalities of gait and mobility: Secondary | ICD-10-CM | POA: Diagnosis not present

## 2021-01-23 DIAGNOSIS — M25561 Pain in right knee: Secondary | ICD-10-CM

## 2021-01-23 NOTE — Therapy (Signed)
[facility] [address]  [place], [place], [address] Phone: [phone]   Fax:  [phone]  Physical Therapy Treatment  Patient Details  Name: Holly Herring MRN: [id number] Date of Birth: 10-07-1951 Referring Provider (PT): Gaynelle Arabian MD   Encounter Date: 01/23/2021   PT End of Session - 01/23/21 0843    Visit Number 4    Number of Visits 12    Date for PT Re-Evaluation 03/09/21    PT Start Time 0800    PT Stop Time 0852    PT Time Calculation (min) 52 min    Activity Tolerance Patient tolerated treatment well    Behavior During Therapy [facility] for tasks assessed/performed           Past Medical History:  Diagnosis Date  . Arthritis   . Hypertension   . Uterine prolapse    had a Gelhorn pessary implant    Past Surgical History:  Procedure Laterality Date  . ABDOMINAL HYSTERECTOMY  2019  . BREAST SURGERY Bilateral    breast reduction  . donut     Gelhorn  pessary for uterine prolapse  . HERNIA REPAIR    . KNEE ARTHROSCOPY     Dr Melba Coon  . OOPHORECTOMY     one ovary removed due to ectopic  . REDUCTION MAMMAPLASTY Bilateral 2002  . 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   Procedure: TOTAL KNEE ARTHROPLASTY;  Surgeon: Kerin Salen, MD;  Location: [facility];  Service: Orthopedics;  Laterality: Left;  DEPUY/SIGMA  . TOTAL KNEE ARTHROPLASTY Right 01/09/2021   Procedure: TOTAL KNEE ARTHROPLASTY;  Surgeon: Gaynelle Arabian, MD;  Location: [facility];  Service: Orthopedics;  Laterality: Right;  75mn    There were no vitals filed for this visit.   Subjective Assessment - 01/23/21 0806    Subjective Pt reports she took her pain medicine earlier and she had a chance to do a little exercise before therapy.    Pertinent History history of L TKA, HTN, uterine prolapse surgery    Patient Stated Goals to get back to PLOF, walking again    Currently in Pain? Yes    Pain Score 3     Pain Location Knee    Pain  Orientation Right;Anterior    Pain Descriptors / Indicators Sore    Pain Type Acute pain;Surgical pain              [facility] PT Assessment - 01/23/21 0001      AROM   Right Knee Extension -3    Right Knee Flexion 82      Strength   Right Hip Flexion 3/5    Right Hip Extension 3+/5    Right Hip ABduction 4-/5    Right Hip ADduction 3+/5    Right Knee Flexion 3+/5    Right Knee Extension 4-/5                         [facility] Adult PT Treatment/Exercise - 01/23/21 0001      Knee/Hip Exercises: Aerobic   Nustep L2x7 min      Knee/Hip Exercises: Standing   Heel Raises Both;20 reps    Heel Raises Limitations counter for assistance    Hip Flexion Stengthening;Both;10 reps;2 sets    Hip Flexion Limitations counter for assistance    Functional Squat 10 reps    Functional Squat Limitations mini squat      Knee/Hip Exercises: Seated  Knee/Hip Flexion knee flexion AAROM 20x5 sec      Knee/Hip Exercises: Supine   Short Arc Quad Sets Strengthening;Right;2 sets;10 reps    Short Arc Quad Sets Limitations 1# cues for qaud activation    Heel Slides AAROM;Right;2 sets;10 reps    Heel Slides Limitations 5 sec hold with strap    Straight Leg Raises Strengthening;Right;2 sets;10 reps    Straight Leg Raises Limitations 1# with QS      Modalities   Modalities Vasopneumatic      Vasopneumatic   Number Minutes Vasopneumatic  10 minutes    Vasopnuematic Location  Knee    Vasopneumatic Pressure Low    Vasopneumatic Temperature  34                    PT Short Term Goals - 01/23/21 0838      PT SHORT TERM GOAL #1   Title Pt will be independent with initial HEP    Time 2    Period Weeks    Status Achieved    Target Date 01/26/21             PT Long Term Goals - 01/23/21 0932      PT LONG TERM GOAL #1   Title Pt will be independent with advanced HEP    Time 8    Period Weeks    Status Achieved    Target Date 03/09/21      PT LONG TERM GOAL #2    Title Pt will demo R knee ROM to at least 0 - 115 deg with </= 2/10 pain    Time 8    Period Weeks    Status On-going    Target Date 03/09/21      PT LONG TERM GOAL #3   Title Pt will be able to ambulate at least 100 feet without AD, </=2/10 pain, and with normalized gait pattern    Time 8    Period Weeks    Status On-going    Target Date 03/09/21      PT LONG TERM GOAL #4   Title Pt will demo BLE strength >/= 4+/5    Time 8    Period Weeks    Status On-going    Target Date 03/09/21                 Plan - 01/23/21 0847    Clinical Impression Statement Pt showed good performance of exercises, she did need some cueing with the standing marches and mini squats to keep knees from going above the toes and to complete exercises with a slow and controlled motion. Pt does note cont'd stiffness when going to bend her R knee. Knee AROM increased to 3- 82 degrees. Pt R LE strength has increased but pt is still weak especially in hip flexors, knee flexors, and hip extensors. Pt still ambulating with AD with decreased step length on the R and lacking knee flexion when clearing the foot. Pt is comliant with HEP , verbalized and demonstrated good performances of exercises so has met STG 1 and LTG 1, progress is being made toward all other goals. Game ready post session to address swelling.    Personal Factors and Comorbidities Past/Current Experience;Comorbidity 2;Time since onset of injury/illness/exacerbation;Finances    PT Frequency Other (comment)    PT Duration 8 weeks    PT Treatment/Interventions ADLs/Self Care Home Management;Cryotherapy;Electrical Stimulation;Moist Heat;Gait training;Neuromuscular re-education;Balance training;Therapeutic exercise;Therapeutic activities;Functional mobility training;Stair training;Patient/family education;Manual techniques;Energy conservation;Taping;Vasopneumatic Device  PT Next Visit Plan work on knee ROM, quad strengthening, manual and modalities as  needed. Seeing MD March 1st    PT Home Exercise Plan see pt education    Consulted and Agree with Plan of Care Patient           Patient will benefit from skilled therapeutic intervention in order to improve the following deficits and impairments:  Abnormal gait,Decreased range of motion,Difficulty walking,Pain,Improper body mechanics,Impaired flexibility,Decreased balance,Decreased mobility,Decreased strength,Increased edema,Increased muscle spasms  Visit Diagnosis: Muscle weakness (generalized)  Acute pain of right knee  Stiffness of right knee, not elsewhere classified     Problem List Patient Active Problem List   Diagnosis Date Noted  . OA (osteoarthritis) of knee 01/09/2021  . Preventive measure 01/25/2016  . Numbness and tingling in hands 01/18/2015  . Former smoker, stopped smoking in distant past 01/18/2015  . Hypertension 12/26/2011  . Postmenopausal status, on HRT 12/26/2011  . Knee pain, s/p TKR LEFT 12/26/2011    Artist Pais, PTA 01/23/2021, 9:38 AM  [facility] [address]  [place], [place], [address] Phone: [phone]   Fax:  [phone]  Name: Holly Herring MRN: [id number] Date of Birth: 1951/10/20

## 2021-01-25 ENCOUNTER — Telehealth: Payer: Self-pay | Admitting: Family Medicine

## 2021-01-25 NOTE — Telephone Encounter (Signed)
°   Holly Herring DOB: [birth year] MRN: [id number]   RIDER WAIVER AND RELEASE OF LIABILITY  For purposes of improving physical access to our facilities, [facility] is pleased to partner with third parties to provide [facility] patients or other authorized individuals the option of convenient, on-demand ground transportation services (the [employer]) through use of the technology service that enables users to request on-demand ground transportation from independent third-party providers.  By opting to use and accept these [employer], I, the undersigned, hereby agree on behalf of myself, and on behalf of any minor child using the [employer] for whom I am the parent or legal guardian, as follows:  1. Science writer provided to me are provided by independent third-party transportation providers who are not [employer] or employees and who are unaffiliated with [employer]. 2. [facility] is neither a transportation carrier nor a common or public carrier. 3. [facility] has no control over the quality or safety of the transportation that occurs as a result of the [employer]. 4. [facility] cannot guarantee that any third-party transportation provider will complete any arranged transportation service. 5. [facility] makes no representation, warranty, or guarantee regarding the reliability, timeliness, quality, safety, suitability, or availability of any of the Transport Services or that they will be error free. 6. I fully understand that traveling by vehicle involves risks and dangers of serious bodily injury, including permanent disability, paralysis, and death. I agree, on behalf of myself and on behalf of any minor child using the Transport Services for whom I am the parent or legal guardian, that the entire risk arising out of my use of the [employer] remains solely with me, to the maximum extent permitted under applicable law. 7. The [employer] are provided as is and as available. [facility] disclaims all representations and warranties, express, implied or statutory, not expressly set out in these terms, including the implied warranties of merchantability and fitness for a particular purpose. 8. I hereby waive and release [facility], its agents, employees, officers, directors, representatives, insurers, attorneys, assigns, successors, subsidiaries, and affiliates from any and all past, present, or future claims, demands, liabilities, actions, causes of action, or suits of any kind directly or indirectly arising from acceptance and use of the [employer]. 9. I further waive and release [facility] and its affiliates from all present and future liability and responsibility for any injury or death to persons or damages to property caused by or related to the use of the [employer]. 10. I have read this Waiver and Release of Liability, and I understand the terms used in it and their legal significance. This Waiver is freely and voluntarily given with the understanding that my right (as well as the right of any minor child for whom I am the parent or legal guardian using the [employer]) to legal recourse against [facility] in connection with the [employer] is knowingly surrendered in return for use of these services.   I attest that I read the consent document to [name], gave Ms. Landenberger the opportunity to ask questions and answered the questions asked (if any). I affirm that [name] then provided consent for she's participation in this program.     [name]

## 2021-01-26 ENCOUNTER — Ambulatory Visit: Payer: Medicare Other | Attending: Orthopedic Surgery | Admitting: Physical Therapy

## 2021-01-26 ENCOUNTER — Other Ambulatory Visit: Payer: Self-pay

## 2021-01-26 ENCOUNTER — Encounter: Payer: Self-pay | Admitting: Physical Therapy

## 2021-01-26 DIAGNOSIS — M6281 Muscle weakness (generalized): Secondary | ICD-10-CM

## 2021-01-26 DIAGNOSIS — R2689 Other abnormalities of gait and mobility: Secondary | ICD-10-CM

## 2021-01-26 DIAGNOSIS — R600 Localized edema: Secondary | ICD-10-CM | POA: Diagnosis not present

## 2021-01-26 DIAGNOSIS — M25661 Stiffness of right knee, not elsewhere classified: Secondary | ICD-10-CM | POA: Insufficient documentation

## 2021-01-26 DIAGNOSIS — M25561 Pain in right knee: Secondary | ICD-10-CM | POA: Diagnosis not present

## 2021-01-26 DIAGNOSIS — M25562 Pain in left knee: Secondary | ICD-10-CM | POA: Insufficient documentation

## 2021-01-26 NOTE — Therapy (Signed)
[facility] Outpatient Rehabilitation [facility] [address]  [address] [place], [place], [address] Phone: [phone]   Fax:  [phone]  Physical Therapy Treatment  Patient Details  Name: Holly Herring MRN: [id number] Date of Birth: 1951/11/23 Referring Provider (PT): Ollen Gross MD   Encounter Date: 01/26/2021   PT End of Session - 01/26/21 0844    Visit Number 5    Number of Visits 12    Date for PT Re-Evaluation 03/09/21    PT Start Time 0758    PT Stop Time 0853    PT Time Calculation (min) 55 min    Equipment Utilized During Treatment Gait belt    Activity Tolerance Patient tolerated treatment well    Behavior During Therapy [facility] for tasks assessed/performed           Past Medical History:  Diagnosis Date  . Arthritis   . Hypertension   . Uterine prolapse    had a Gelhorn pessary implant    Past Surgical History:  Procedure Laterality Date  . ABDOMINAL HYSTERECTOMY  2019  . BREAST SURGERY Bilateral    breast reduction  . donut     Gelhorn  pessary for uterine prolapse  . HERNIA REPAIR    . KNEE ARTHROSCOPY     Dr Hollice Gong  . OOPHORECTOMY     one ovary removed due to ectopic  . REDUCTION MAMMAPLASTY Bilateral 2002  . 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   Procedure: TOTAL KNEE ARTHROPLASTY;  Surgeon: Nestor Lewandowsky, MD;  Location: [facility];  Service: Orthopedics;  Laterality: Left;  DEPUY/SIGMA  . TOTAL KNEE ARTHROPLASTY Right 01/09/2021   Procedure: TOTAL KNEE ARTHROPLASTY;  Surgeon: Ollen Gross, MD;  Location: [facility];  Service: Orthopedics;  Laterality: Right;     There were no vitals filed for this visit.   Subjective Assessment - 01/26/21 0800    Subjective Feeling a little stiff this AM- took some meds at 5am. Has been trying to cut back on her pain meds. Denies episodes of knee buckling.    Pertinent History history of L TKA, HTN, uterine prolapse surgery    Patient Stated Goals to get back to PLOF, walking again     Currently in Pain? Yes    Pain Score 6     Pain Location Knee    Pain Orientation Right;Anterior    Pain Descriptors / Indicators Sore    Pain Type Acute pain;Surgical pain                             [facility] Adult PT Treatment/Exercise - 01/26/21 0001      Knee/Hip Exercises: Stretches   Other Knee/Hip Stretches R knee flexion stretch on 9" step 10x5"      Knee/Hip Exercises: Aerobic   Nustep L2x6 min (UEs/LEs)      Knee/Hip Exercises: Standing   Heel Raises Both;20 reps    Heel Raises Limitations counter for assistance   cues for TKE   Knee Flexion Strengthening;Right;10 reps;2 sets    Knee Flexion Limitations 2#   attempted with yellow TB but discontinued d/t difficulty   Hip Flexion AROM;Both;1 set;20 reps;Knee bent    Hip Flexion Limitations march at counter    Hip Abduction Stengthening;Right;Left;1 set;10 reps;Knee straight    Abduction Limitations at counter   cues to maintain trunk upright   Hip Extension Stengthening;Right;Left;1 set;10 reps;Knee straight    Extension Limitations at counter  Lateral Step Up Right;1 set;5 reps;Hand Hold: 2;Step Height: 4"    Lateral Step Up Limitations R step up/down    Forward Step Up Right;1 set;10 reps;Hand Hold: 1;Step Height: 4"    Forward Step Up Limitations 5x R step up/back, 5x R step up/over   cueing to hit heel down first     Knee/Hip Exercises: Seated   Sit to Sand 1 set;10 reps;without UE support   good form/tolerance     Knee/Hip Exercises: Supine   Heel Slides AAROM;Right;10 reps;1 set    Heel Slides Limitations 10x3" with strap and peanutball      Vasopneumatic   Number Minutes Vasopneumatic  10 minutes    Vasopnuematic Location  Knee   R   Vasopneumatic Pressure Low    Vasopneumatic Temperature  34      Manual Therapy   Manual Therapy Joint mobilization    Manual therapy comments supine    Joint Mobilization R patella mobs M/L and superior/inferior grade III to tolerance   moderate  hypomobility noted; educated pt on self-patellar mobs at home daily for 3 minutes                   PT Short Term Goals - 01/23/21 0838      PT SHORT TERM GOAL #1   Title Pt will be independent with initial HEP    Time 2    Period Weeks    Status Achieved    Target Date 01/26/21             PT Long Term Goals - 01/23/21 0932      PT LONG TERM GOAL #1   Title Pt will be independent with advanced HEP    Time 8    Period Weeks    Status Achieved    Target Date 03/09/21      PT LONG TERM GOAL #2   Title Pt will demo R knee ROM to at least 0 - 115 deg with </= 2/10 pain    Time 8    Period Weeks    Status On-going    Target Date 03/09/21      PT LONG TERM GOAL #3   Title Pt will be able to ambulate at least 100 feet without AD, </=2/10 pain, and with normalized gait pattern    Time 8    Period Weeks    Status On-going    Target Date 03/09/21      PT LONG TERM GOAL #4   Title Pt will demo BLE strength >/= 4+/5    Time 8    Period Weeks    Status On-going    Target Date 03/09/21                 Plan - 01/26/21 0845    Clinical Impression Statement Patient arrived to session with report that she has been trying to cut back on her pain meds, thus pain levels slightly more elevated today. Worked on progressive LE strengthening ther-ex in standing. Patient required intermittent cueing to maintain chest upright. Intermittent short sitting rest breaks taken in between standing ther-ex d/t pain and fatigue. Able to perform STS well without UE support today.  Initiated step up/downs on short step with patient demonstrating good stability but limited eccentric control d/t quad weakness. Worked on patellar mobilizations which revealed moderate hypomobility- improved after MT. Ended session with Gameready to R knee for pain and edema relief. Patient without complaints at end of session.  Personal Factors and Comorbidities Past/Current Experience;Comorbidity 2;Time  since onset of injury/illness/exacerbation;Finances    Comorbidities HTN, L TKA 2014    PT Frequency Other (comment)    PT Duration 8 weeks    PT Treatment/Interventions ADLs/Self Care Home Management;Cryotherapy;Electrical Stimulation;Moist Heat;Gait training;Neuromuscular re-education;Balance training;Therapeutic exercise;Therapeutic activities;Functional mobility training;Stair training;Patient/family education;Manual techniques;Energy conservation;Taping;Vasopneumatic Device    PT Next Visit Plan work on knee ROM, quad strengthening, manual and modalities as needed.    PT Home Exercise Plan see pt education    Consulted and Agree with Plan of Care Patient           Patient will benefit from skilled therapeutic intervention in order to improve the following deficits and impairments:  Abnormal gait,Decreased range of motion,Difficulty walking,Pain,Improper body mechanics,Impaired flexibility,Decreased balance,Decreased mobility,Decreased strength,Increased edema,Increased muscle spasms  Visit Diagnosis: Muscle weakness (generalized)  Acute pain of right knee  Stiffness of right knee, not elsewhere classified  Left knee pain, unspecified chronicity  Other abnormalities of gait and mobility  Localized edema     Problem List Patient Active Problem List   Diagnosis Date Noted  . OA (osteoarthritis) of knee 01/09/2021  . Preventive measure 01/25/2016  . Numbness and tingling in hands 01/18/2015  . Former smoker, stopped smoking in distant past 01/18/2015  . Hypertension 12/26/2011  . Postmenopausal status, on HRT 12/26/2011  . Knee pain, s/p TKR LEFT 12/26/2011      Anette Guarneri, PT, DPT 01/26/21 8:58 AM   [facility] [address]  [address] [place], [place], [address] Phone: [phone]   Fax:  [phone]  Name: Holly Herring MRN: [id number] Date of Birth: Oct 07, 1951

## 2021-01-30 ENCOUNTER — Ambulatory Visit: Payer: Medicare Other

## 2021-01-30 ENCOUNTER — Other Ambulatory Visit: Payer: Self-pay

## 2021-01-30 DIAGNOSIS — M6281 Muscle weakness (generalized): Secondary | ICD-10-CM

## 2021-01-30 DIAGNOSIS — M25562 Pain in left knee: Secondary | ICD-10-CM | POA: Diagnosis not present

## 2021-01-30 DIAGNOSIS — M25661 Stiffness of right knee, not elsewhere classified: Secondary | ICD-10-CM

## 2021-01-30 DIAGNOSIS — M25561 Pain in right knee: Secondary | ICD-10-CM | POA: Diagnosis not present

## 2021-01-30 DIAGNOSIS — R600 Localized edema: Secondary | ICD-10-CM | POA: Diagnosis not present

## 2021-01-30 DIAGNOSIS — R6889 Other general symptoms and signs: Secondary | ICD-10-CM | POA: Diagnosis not present

## 2021-01-30 DIAGNOSIS — R2689 Other abnormalities of gait and mobility: Secondary | ICD-10-CM | POA: Diagnosis not present

## 2021-01-30 NOTE — Therapy (Signed)
[facility] Outpatient Rehabilitation [facility] [address]  [address] [place], [place], [address] Phone: [phone]   Fax:  [phone]  Physical Therapy Treatment  Patient Details  Name: Holly Herring MRN: [id number] Date of Birth: 09-Dec-1950 Referring Provider (PT): Ollen Gross MD   Encounter Date: 01/30/2021   PT End of Session - 01/30/21 0845    Visit Number 6    Number of Visits 12    Date for PT Re-Evaluation 03/09/21    PT Start Time 0800    PT Stop Time 0843    PT Time Calculation (min) 43 min    Activity Tolerance Patient tolerated treatment well    Behavior During Therapy [facility] for tasks assessed/performed           Past Medical History:  Diagnosis Date  . Arthritis   . Hypertension   . Uterine prolapse    had a Gelhorn pessary implant    Past Surgical History:  Procedure Laterality Date  . ABDOMINAL HYSTERECTOMY  2019  . BREAST SURGERY Bilateral    breast reduction  . donut     Gelhorn  pessary for uterine prolapse  . HERNIA REPAIR    . KNEE ARTHROSCOPY     Dr Hollice Gong  . OOPHORECTOMY     one ovary removed due to ectopic  . REDUCTION MAMMAPLASTY Bilateral 2002  . 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   Procedure: TOTAL KNEE ARTHROPLASTY;  Surgeon: Nestor Lewandowsky, MD;  Location: [facility] OR;  Service: Orthopedics;  Laterality: Left;  DEPUY/SIGMA  . TOTAL KNEE ARTHROPLASTY Right 01/09/2021   Procedure: TOTAL KNEE ARTHROPLASTY;  Surgeon: Ollen Gross, MD;  [facility];  Service: Orthopedics;  Laterality: Right;     There were no vitals filed for this visit.   Subjective Assessment - 01/30/21 0806    Subjective Pt reports that her knee is a little stiff this morning.    Pertinent History history of L TKA, HTN, uterine prolapse surgery    Patient Stated Goals to get back to PLOF, walking again    Currently in Pain? Yes    Pain Score 2     Pain Location Knee    Pain Orientation Right;Anterior    Pain Descriptors /  Indicators Sore    Pain Type Acute pain;Surgical pain                             [facility] Adult PT Treatment/Exercise - 01/30/21 0001      Exercises   Exercises Knee/Hip      Knee/Hip Exercises: Aerobic   Nustep L3x37min      Knee/Hip Exercises: Standing   Lateral Step Up Right;1 set;10 reps;Step Height: 4"    Lateral Step Up Limitations step down 5x w/ L    Forward Step Up Right;1 set;Step Height: 4";10 reps    Other Standing Knee Exercises lateral walk 5x w/o hand support Y tband    Other Standing Knee Exercises hs curl 2# 5 reps      Knee/Hip Exercises: Seated   Long Arc Quad Strengthening;Both;2 sets;10 reps;Weights    Long Arc Quad Weight 2 lbs.    Sit to Sand 1 set;10 reps;without UE support   low mat table     Knee/Hip Exercises: Supine   Heel Slides AAROM;Right;1 set;10 reps    Heel Slides Limitations with overpressure  PT Short Term Goals - 01/23/21 0838      PT SHORT TERM GOAL #1   Title Pt will be independent with initial HEP    Time 2    Period Weeks    Status Achieved    Target Date 01/26/21             PT Long Term Goals - 01/23/21 0932      PT LONG TERM GOAL #1   Title Pt will be independent with advanced HEP    Time 8    Period Weeks    Status Achieved    Target Date 03/09/21      PT LONG TERM GOAL #2   Title Pt will demo R knee ROM to at least 0 - 115 deg with </= 2/10 pain    Time 8    Period Weeks    Status On-going    Target Date 03/09/21      PT LONG TERM GOAL #3   Title Pt will be able to ambulate at least 100 feet without AD, </=2/10 pain, and with normalized gait pattern    Time 8    Period Weeks    Status On-going    Target Date 03/09/21      PT LONG TERM GOAL #4   Title Pt will demo BLE strength >/= 4+/5    Time 8    Period Weeks    Status On-going    Target Date 03/09/21                 Plan - 01/30/21 0853    Clinical Impression Statement Pt responded well to  treatment, progressed pt standing ther ex with less hand support and cues to isolate knee muscles. She required rest breaks in between standing exercises d/t fatigue. She still has trouble with knee flexion, did some heel slides with overpressure to help increase knee flexion to which pt responded well. Cueing was given to prevent leaning trunk during standing exercises and to prevent excessive UE use. Pt declinded game ready post session today but reportd she would ice at home.    Personal Factors and Comorbidities Past/Current Experience;Comorbidity 2;Time since onset of injury/illness/exacerbation;Finances    Comorbidities HTN, L TKA 2014    PT Frequency Other (comment)    PT Duration 8 weeks    PT Treatment/Interventions ADLs/Self Care Home Management;Cryotherapy;Electrical Stimulation;Moist Heat;Gait training;Neuromuscular re-education;Balance training;Therapeutic exercise;Therapeutic activities;Functional mobility training;Stair training;Patient/family education;Manual techniques;Energy conservation;Taping;Vasopneumatic Device    PT Next Visit Plan work on knee ROM, quad strengthening, manual and modalities as needed.    PT Home Exercise Plan see pt education    Consulted and Agree with Plan of Care Patient           Patient will benefit from skilled therapeutic intervention in order to improve the following deficits and impairments:  Abnormal gait,Decreased range of motion,Difficulty walking,Pain,Improper body mechanics,Impaired flexibility,Decreased balance,Decreased mobility,Decreased strength,Increased edema,Increased muscle spasms  Visit Diagnosis: Muscle weakness (generalized)  Acute pain of right knee  Stiffness of right knee, not elsewhere classified     Problem List Patient Active Problem List   Diagnosis Date Noted  . OA (osteoarthritis) of knee 01/09/2021  . Preventive measure 01/25/2016  . Numbness and tingling in hands 01/18/2015  . Former smoker, stopped smoking in  distant past 01/18/2015  . Hypertension 12/26/2011  . Postmenopausal status, on HRT 12/26/2011  . Knee pain, s/p TKR LEFT 12/26/2011    Darleene Cleaver, PTA 01/30/2021, 9:37 AM  [facility] [address]  [address] [place], [place], [address] Phone: [phone]   Fax:  [phone]  Name: Holly Herring MRN: [id number] Date of Birth: 12/27/1950

## 2021-02-02 ENCOUNTER — Other Ambulatory Visit: Payer: Self-pay

## 2021-02-02 ENCOUNTER — Encounter: Payer: Self-pay | Admitting: Physical Therapy

## 2021-02-02 ENCOUNTER — Ambulatory Visit: Payer: Medicare Other | Admitting: Physical Therapy

## 2021-02-02 DIAGNOSIS — M25562 Pain in left knee: Secondary | ICD-10-CM | POA: Diagnosis not present

## 2021-02-02 DIAGNOSIS — M25561 Pain in right knee: Secondary | ICD-10-CM

## 2021-02-02 DIAGNOSIS — R600 Localized edema: Secondary | ICD-10-CM

## 2021-02-02 DIAGNOSIS — R2689 Other abnormalities of gait and mobility: Secondary | ICD-10-CM | POA: Diagnosis not present

## 2021-02-02 DIAGNOSIS — M25661 Stiffness of right knee, not elsewhere classified: Secondary | ICD-10-CM

## 2021-02-02 DIAGNOSIS — M6281 Muscle weakness (generalized): Secondary | ICD-10-CM

## 2021-02-02 NOTE — Therapy (Signed)
[facility] Outpatient Rehabilitation [facility] [address]  [address] [place], [place], [address] Phone: [phone]   Fax:  [phone]  Physical Therapy Treatment  Patient Details  Name: Holly Herring MRN: [id number] Date of Birth: 1951-03-31 Referring Provider (PT): Ollen Gross MD   Encounter Date: 02/02/2021   PT End of Session - 02/02/21 0845    Visit Number 7    Number of Visits 12    Date for PT Re-Evaluation 03/09/21    PT Start Time 0759    PT Stop Time 0843    PT Time Calculation (min) 44 min    Activity Tolerance Patient tolerated treatment well;Patient limited by pain    Behavior During Therapy [facility] for tasks assessed/performed           Past Medical History:  Diagnosis Date  . Arthritis   . Hypertension   . Uterine prolapse    had a Gelhorn pessary implant    Past Surgical History:  Procedure Laterality Date  . ABDOMINAL HYSTERECTOMY  2019  . BREAST SURGERY Bilateral    breast reduction  . donut     Gelhorn  pessary for uterine prolapse  . HERNIA REPAIR    . KNEE ARTHROSCOPY     Dr Hollice Gong  . OOPHORECTOMY     one ovary removed due to ectopic  . REDUCTION MAMMAPLASTY Bilateral 2002  . 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   Procedure: TOTAL KNEE ARTHROPLASTY;  Surgeon: Nestor Lewandowsky, MD;  [facility]: [facility];  Service: Orthopedics;  Laterality: Left;  DEPUY/SIGMA  . TOTAL KNEE ARTHROPLASTY Right 01/09/2021   Procedure: TOTAL KNEE ARTHROPLASTY;  Surgeon: Ollen Gross, MD;  [facility]: [facility];  Service: Orthopedics;  Laterality: Right;     There were no vitals filed for this visit.   Subjective Assessment - 02/02/21 0802    Subjective Doing okay. Feels like she is getting better and will be ready to graduate soon.    Pertinent History history of L TKA, HTN, uterine prolapse surgery    Patient Stated Goals to get back to PLOF, walking again    Currently in Pain? No/denies              [facility] PT Assessment - 02/02/21  0001      ROM / Strength   AROM / PROM / Strength AROM;PROM      AROM   Right Knee Extension 0    Right Knee Flexion 80      PROM   PROM Assessment Site Knee    Right/Left Knee Right;Left    Right Knee Extension 0    Right Knee Flexion 85   reached 87 deg after MT                        [facility] Adult PT Treatment/Exercise - 02/02/21 0001      Knee/Hip Exercises: Stretches   Passive Hamstring Stretch Right;2 reps;30 seconds    Passive Hamstring Stretch Limitations supine with strap    Hip Flexor Stretch Right;2 reps;30 seconds    Hip Flexor Stretch Limitations mod thomas with strap    Other Knee/Hip Stretches R knee flexion stretch on step 10x5"   reaching 92 deg flexion     Knee/Hip Exercises: Aerobic   Recumbent Bike L1 x 6 min (partial revolutions)      Knee/Hip Exercises: Standing   Functional Squat 1 set;10 reps    Functional Squat Limitations touching bottom to  chair      Knee/Hip Exercises: Supine   Heel Slides AAROM;Right;1 set;10 reps    Heel Slides Limitations 10x3" with pball and strap      Manual Therapy   Manual Therapy Joint mobilization    Manual therapy comments supine    Joint Mobilization R patella mobs M/L and superior/inferior grade III/IV to tolerance; R proximal tibial PAs grade III/IV followed by knee flexion holds   moderate hypomobility                 PT Education - 02/02/21 0844    Education Details update to HEP; discussion on typical rehab course for TKA and expected ROM for functional activities; encouraged patient to be more aggressive with ROM and stretching    Person(s) Educated Patient    Methods Explanation;Demonstration;Tactile cues;Verbal cues;Handout    Comprehension Verbalized understanding;Returned demonstration            PT Short Term Goals - 01/23/21 0838      PT SHORT TERM GOAL #1   Title Pt will be independent with initial HEP    Time 2    Period Weeks    Status Achieved    Target Date  01/26/21             PT Long Term Goals - 01/23/21 0932      PT LONG TERM GOAL #1   Title Pt will be independent with advanced HEP    Time 8    Period Weeks    Status Achieved    Target Date 03/09/21      PT LONG TERM GOAL #2   Title Pt will demo R knee ROM to at least 0 - 115 deg with </= 2/10 pain    Time 8    Period Weeks    Status On-going    Target Date 03/09/21      PT LONG TERM GOAL #3   Title Pt will be able to ambulate at least 100 feet without AD, </=2/10 pain, and with normalized gait pattern    Time 8    Period Weeks    Status On-going    Target Date 03/09/21      PT LONG TERM GOAL #4   Title Pt will demo BLE strength >/= 4+/5    Time 8    Period Weeks    Status On-going    Target Date 03/09/21                 Plan - 02/02/21 0845    Clinical Impression Statement Patient arrived to session with report that she feels ready to wrap up with therapy soon. R knee incision still with small areas of scabbing, thus advised patient to hold off on scar massage and use of lotion for now. Worked on patellar mobs, which still demonstrated moderate hypomobility in all directions. Initiated tibiofemoral mobs for flexion, after which patient was able to reach 87 degrees of flexion PROM. Adjusted HEP to address knee flexion, with exercises that patient tolerated well today. Was able to reach 92 degrees of knee flexion with standing stretch. After further discussion and explanation on her current progress with therapy, patient seemed more agreeable to continue with PT. Patient does not currently have a functional knee ROM, and will continue to benefit from skilled therapy. Encouraged patient to be more aggressive with knee flexion stretching to reach more functional ROM. Patient reported understanding, declined modalities, and without complaints at end of session.  Personal Factors and Comorbidities Past/Current Experience;Comorbidity 2;Time since onset of  injury/illness/exacerbation;Finances    Comorbidities HTN, L TKA 2014    PT Frequency Other (comment)    PT Duration 8 weeks    PT Treatment/Interventions ADLs/Self Care Home Management;Cryotherapy;Electrical Stimulation;Moist Heat;Gait training;Neuromuscular re-education;Balance training;Therapeutic exercise;Therapeutic activities;Functional mobility training;Stair training;Patient/family education;Manual techniques;Energy conservation;Taping;Vasopneumatic Device    PT Next Visit Plan patellar and tibiofemoral jt mobs; work on knee flexion ROM, quad strengthening, manual and modalities as needed.    PT Home Exercise Plan see pt education    Consulted and Agree with Plan of Care Patient           Patient will benefit from skilled therapeutic intervention in order to improve the following deficits and impairments:  Abnormal gait,Decreased range of motion,Difficulty walking,Pain,Improper body mechanics,Impaired flexibility,Decreased balance,Decreased mobility,Decreased strength,Increased edema,Increased muscle spasms  Visit Diagnosis: Muscle weakness (generalized)  Acute pain of right knee  Stiffness of right knee, not elsewhere classified  Left knee pain, unspecified chronicity  Other abnormalities of gait and mobility  Localized edema     Problem List Patient Active Problem List   Diagnosis Date Noted  . OA (osteoarthritis) of knee 01/09/2021  . Preventive measure 01/25/2016  . Numbness and tingling in hands 01/18/2015  . Former smoker, stopped smoking in distant past 01/18/2015  . Hypertension 12/26/2011  . Postmenopausal status, on HRT 12/26/2011  . Knee pain, s/p TKR LEFT 12/26/2011     Anette Guarneri, PT, DPT 02/02/21 11:20 AM   [facility] [address]  [address] [place], [place], [address] Phone: [phone]   Fax:  [phone]  Name: REHA MARTINOVICH MRN: [id number] Date of Birth: 12-Oct-1951

## 2021-02-03 ENCOUNTER — Ambulatory Visit: Payer: Medicare Other | Admitting: Obstetrics & Gynecology

## 2021-02-03 ENCOUNTER — Encounter: Payer: Self-pay | Admitting: Obstetrics & Gynecology

## 2021-02-03 VITALS — BP 156/75 | HR 87 | Ht 61.0 in | Wt 188.0 lb

## 2021-02-03 DIAGNOSIS — R6889 Other general symptoms and signs: Secondary | ICD-10-CM | POA: Diagnosis not present

## 2021-02-03 DIAGNOSIS — N819 Female genital prolapse, unspecified: Secondary | ICD-10-CM | POA: Diagnosis not present

## 2021-02-03 NOTE — Progress Notes (Signed)
HPI Pt presents today to have pessary removed and cleaned.  She denies problems and reports that her pessary is working very well.  She denies further complaints.  She plans to have knee replacement surgery in Feb.   Review of Systems: non contributory.        Objective:   Physical Exam BP (!) 171/83   Pulse 87   Ht 5\' 1"  (1.549 m)   Wt 198 lb (89.8 kg)   BMI 37.41 kg/m   CONSTITUTIONAL: Well-developed, well-nourished female in no acute distress.  HENT:  Normocephalic, atraumatic EYES: Conjunctivae and EOM are normal. No scleral icterus.  NECK: Normal range of motion SKIN: Skin is warm and dry. No rash noted. Not diaphoretic.No pallor. NEUROLGIC: Alert and oriented to person, place, and time. Normal coordination.   GYN: Pessary removed and cleaned; vaginal mucosa looks healty and with no excoriations or breakdown     Assessment:     Pessary check- doing well. Questions answered about Pessary the foley intraop.     Plan:     F/u in 3 months to have pessary removed and cleaned F/u sooner prn   Carolyn L. Harraway-Smith, M.D., 

## 2021-02-06 ENCOUNTER — Ambulatory Visit: Payer: Medicare Other

## 2021-02-06 ENCOUNTER — Other Ambulatory Visit: Payer: Self-pay

## 2021-02-06 DIAGNOSIS — R600 Localized edema: Secondary | ICD-10-CM | POA: Diagnosis not present

## 2021-02-06 DIAGNOSIS — R2689 Other abnormalities of gait and mobility: Secondary | ICD-10-CM | POA: Diagnosis not present

## 2021-02-06 DIAGNOSIS — M25562 Pain in left knee: Secondary | ICD-10-CM | POA: Diagnosis not present

## 2021-02-06 DIAGNOSIS — M6281 Muscle weakness (generalized): Secondary | ICD-10-CM

## 2021-02-06 DIAGNOSIS — M25661 Stiffness of right knee, not elsewhere classified: Secondary | ICD-10-CM | POA: Diagnosis not present

## 2021-02-06 DIAGNOSIS — M25561 Pain in right knee: Secondary | ICD-10-CM

## 2021-02-06 NOTE — Therapy (Signed)
[facility] [address]  [address] [place], [place], [address] Phone: [phone]   Fax:  [phone]  Physical Therapy Treatment  Patient Details  Name: Holly Herring MRN: [id number] Date of Birth: 28-Oct-1951 Referring Provider (PT): Ollen Gross MD   Encounter Date: 02/06/2021   PT End of Session - 02/06/21 0808    Visit Number 8    Number of Visits 12    Date for PT Re-Evaluation 03/09/21    PT Start Time 0801    PT Stop Time 0845    PT Time Calculation (min) 44 min    Activity Tolerance Patient tolerated treatment well    Behavior During Therapy [facility] for tasks assessed/performed           Past Medical History:  Diagnosis Date  . Arthritis   . Hypertension   . Uterine prolapse    had a Gelhorn pessary implant    Past Surgical History:  Procedure Laterality Date  . ABDOMINAL HYSTERECTOMY  2019  . BREAST SURGERY Bilateral    breast reduction  . donut     Gelhorn  pessary for uterine prolapse  . HERNIA REPAIR    . KNEE ARTHROSCOPY     Dr Hollice Gong  . OOPHORECTOMY     one ovary removed due to ectopic  . REDUCTION MAMMAPLASTY Bilateral 2002  . 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   Procedure: TOTAL KNEE ARTHROPLASTY;  Surgeon: Nestor Lewandowsky, MD;  Location: [facility];  Service: Orthopedics;  Laterality: Left;  DEPUY/SIGMA  . TOTAL KNEE ARTHROPLASTY Right 01/09/2021   Procedure: TOTAL KNEE ARTHROPLASTY;  Surgeon: Ollen Gross, MD;  Location: [facility];  Service: Orthopedics;  Laterality: Right;     There were no vitals filed for this visit.   Subjective Assessment - 02/06/21 0803    Subjective Doing good, knee is a little stiff.    Pertinent History history of L TKA, HTN, uterine prolapse surgery    Patient Stated Goals to get back to PLOF, walking again    Currently in Pain? Yes    Pain Score 2     Pain Location Knee    Pain Orientation Right;Anterior    Pain Descriptors / Indicators Sore    Pain  Type Acute pain;Surgical pain                             [facility] Adult PT Treatment/Exercise - 02/06/21 0001      Exercises   Exercises Knee/Hip      Knee/Hip Exercises: Stretches   Hip Flexor Stretch Right;2 reps;30 seconds      Knee/Hip Exercises: Aerobic   Nustep L5x81min      Knee/Hip Exercises: Standing   Lateral Step Up Right;2 sets;10 reps;Hand Hold: 0;Step Height: 6"    Forward Step Up Right;2 sets;10 reps;Hand Hold: 2;Step Height: 6"    Forward Step Up Limitations 10 reps w/o hand support; cues to avoid circumduction    Step Down Left;2 sets;Hand Hold: 1;Step Height: 4"    Other Standing Knee Exercises side step x5 along the counter R tband      Knee/Hip Exercises: Seated   Sit to Sand 2 sets;10 reps;without UE support   staggered stance with R leg behind     Knee/Hip Exercises: Supine   Heel Slides AAROM;Right;20 reps    Heel Slides Limitations 3 sec hold      Manual Therapy  Manual Therapy Joint mobilization;Passive ROM    Manual therapy comments supine    Joint Mobilization R patellar mobs and superior/inferior grade III/IV to tolerance    Passive ROM knee flexion 10x sec hold                  PT Education - 02/06/21 0846    Education Details HEP update: Access Code: [email or web address]    Person(s) Educated Patient    Methods Explanation;Demonstration;Verbal cues;Handout    Comprehension Verbalized understanding;Returned demonstration;Verbal cues required            PT Short Term Goals - 01/23/21 0838      PT SHORT TERM GOAL #1   Title Pt will be independent with initial HEP    Time 2    Period Weeks    Status Achieved    Target Date 01/26/21             PT Long Term Goals - 01/23/21 0932      PT LONG TERM GOAL #1   Title Pt will be independent with advanced HEP    Time 8    Period Weeks    Status Achieved    Target Date 03/09/21      PT LONG TERM GOAL #2   Title Pt will demo R knee ROM to at least 0 - 115 deg with </=  2/10 pain    Time 8    Period Weeks    Status On-going    Target Date 03/09/21      PT LONG TERM GOAL #3   Title Pt will be able to ambulate at least 100 feet without AD, </=2/10 pain, and with normalized gait pattern    Time 8    Period Weeks    Status On-going    Target Date 03/09/21      PT LONG TERM GOAL #4   Title Pt will demo BLE strength >/= 4+/5    Time 8    Period Weeks    Status On-going    Target Date 03/09/21                 Plan - 02/06/21 0853    Clinical Impression Statement Pt responded well, she is showing an increased capacity for ther ex and reporting no pain. Cues were given to keep good positioning of the knees and to prevent substitutions, to target the appropriate knee muscles. She did noted that she is still having knee instability when going from sitting to standing positions. She also responded well to PROM into knee flexion post session, gentle stretching into end range. Pt declined game ready post session reported she will ice at home.    Personal Factors and Comorbidities Past/Current Experience;Comorbidity 2;Time since onset of injury/illness/exacerbation;Finances    Comorbidities HTN, L TKA 2014    PT Frequency Other (comment)    PT Duration 8 weeks    PT Treatment/Interventions ADLs/Self Care Home Management;Cryotherapy;Electrical Stimulation;Moist Heat;Gait training;Neuromuscular re-education;Balance training;Therapeutic exercise;Therapeutic activities;Functional mobility training;Stair training;Patient/family education;Manual techniques;Energy conservation;Taping;Vasopneumatic Device    PT Next Visit Plan patellar and tibiofemoral jt mobs; work on knee flexion ROM, quad strengthening, manual and modalities as needed.    PT Home Exercise Plan see pt education    Consulted and Agree with Plan of Care Patient           Patient will benefit from skilled therapeutic intervention in order to improve the following deficits and impairments:   Abnormal gait,Decreased range of motion,Difficulty walking,Pain,Improper body  mechanics,Impaired flexibility,Decreased balance,Decreased mobility,Decreased strength,Increased edema,Increased muscle spasms  Visit Diagnosis: Muscle weakness (generalized)  Acute pain of right knee  Stiffness of right knee, not elsewhere classified     Problem List Patient Active Problem List   Diagnosis Date Noted  . OA (osteoarthritis) of knee 01/09/2021  . Preventive measure 01/25/2016  . Numbness and tingling in hands 01/18/2015  . Former smoker, stopped smoking in distant past 01/18/2015  . Hypertension 12/26/2011  . Postmenopausal status, on HRT 12/26/2011  . Knee pain, s/p TKR LEFT 12/26/2011    Darleene Cleaver, PTA 02/06/2021, 10:14 AM  [facility] [address]  [address] [place], [place], [address] Phone: [phone]   Fax:  [phone]  Name: Holly Herring MRN: [id number] Date of Birth: [birth year]

## 2021-02-06 NOTE — Patient Instructions (Signed)
Access Code: [email or web address] URL: [email or web address] Date: 02/06/2021 Prepared by: Verta Ellen  Exercises Side Stepping with Resistance at Ankles - 1 x daily - 7 x weekly - 2 sets - 10 reps

## 2021-02-09 ENCOUNTER — Ambulatory Visit: Payer: Medicare Other | Admitting: Physical Therapy

## 2021-02-09 ENCOUNTER — Encounter: Payer: Self-pay | Admitting: Physical Therapy

## 2021-02-09 ENCOUNTER — Other Ambulatory Visit: Payer: Self-pay

## 2021-02-09 DIAGNOSIS — R2689 Other abnormalities of gait and mobility: Secondary | ICD-10-CM | POA: Diagnosis not present

## 2021-02-09 DIAGNOSIS — M6281 Muscle weakness (generalized): Secondary | ICD-10-CM | POA: Diagnosis not present

## 2021-02-09 DIAGNOSIS — M25562 Pain in left knee: Secondary | ICD-10-CM | POA: Diagnosis not present

## 2021-02-09 DIAGNOSIS — R600 Localized edema: Secondary | ICD-10-CM | POA: Diagnosis not present

## 2021-02-09 DIAGNOSIS — M25561 Pain in right knee: Secondary | ICD-10-CM

## 2021-02-09 DIAGNOSIS — M25661 Stiffness of right knee, not elsewhere classified: Secondary | ICD-10-CM

## 2021-02-09 NOTE — Therapy (Signed)
[facility] [address]  [address] [place], [place], [address] Phone: [phone]   Fax:  [phone]  Physical Therapy Treatment  Patient Details  Name: Holly Herring MRN: [id number] Date of Birth: Jul 05, 1951 Referring Provider (PT): Ollen Gross MD   Encounter Date: 02/09/2021   PT End of Session - 02/09/21 0842    Visit Number 9    Number of Visits 12    Date for PT Re-Evaluation 03/09/21    PT Start Time 0756    PT Stop Time 0839    PT Time Calculation (min) 43 min    Activity Tolerance Patient tolerated treatment well;Patient limited by pain    Behavior During Therapy [facility] for tasks assessed/performed           Past Medical History:  Diagnosis Date  . Arthritis   . Hypertension   . Uterine prolapse    had a Gelhorn pessary implant    Past Surgical History:  Procedure Laterality Date  . ABDOMINAL HYSTERECTOMY  2019  . BREAST SURGERY Bilateral    breast reduction  . donut     Gelhorn  pessary for uterine prolapse  . HERNIA REPAIR    . KNEE ARTHROSCOPY     Dr Hollice Gong  . OOPHORECTOMY     one ovary removed due to ectopic  . REDUCTION MAMMAPLASTY Bilateral 2002  . 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   Procedure: TOTAL KNEE ARTHROPLASTY;  Surgeon: Nestor Lewandowsky, MD;  [facility]: [facility];  Service: Orthopedics;  Laterality: Left;  DEPUY/SIGMA  . TOTAL KNEE ARTHROPLASTY Right 01/09/2021   Procedure: TOTAL KNEE ARTHROPLASTY;  Surgeon: Ollen Gross, MD;  [facility]: [facility];  Service: Orthopedics;  Laterality: Right;     There were no vitals filed for this visit.   Subjective Assessment - 02/09/21 0757    Subjective Has been busy and running around d/t her husband falling and now in the hospital. Feeling stiff today.    Pertinent History history of L TKA, HTN, uterine prolapse surgery    Patient Stated Goals to get back to PLOF, walking again    Currently in Pain? Yes    Pain Score 2     Pain Location  Knee    Pain Orientation Right;Anterior    Pain Descriptors / Indicators --   stiff   Pain Type Acute pain;Surgical pain              [facility] PT Assessment - 02/09/21 0001      AROM   Right Knee Extension 0    Right Knee Flexion 87      PROM   Right Knee Extension 0    Right Knee Flexion 93                         [facility] Adult PT Treatment/Exercise - 02/09/21 0001      Knee/Hip Exercises: Stretches   Other Knee/Hip Stretches R knee flexion stretch on step 10x5"      Knee/Hip Exercises: Aerobic   Recumbent Bike L1 x 6 min (partial revolutions); additional 3 min partial revolutions after MT      Knee/Hip Exercises: Standing   Lateral Step Up Right;10 reps;Hand Hold: 0;Step Height: 6";1 set    Lateral Step Up Limitations R lateral step down w/ heel touch    Forward Step Up Right;Step Height: 6";2 sets;5 reps;Hand Hold: 1;Step Height: 4"    Forward Step Up Limitations  R step up/over with heel touch   good control on 4", limited control on 6"   Functional Squat 1 set;15 reps    Functional Squat Limitations staggered squat w/ R foot back, touching bottom to chair      Manual Therapy   Manual Therapy Joint mobilization    Joint Mobilization R patellar mobs and superior/inferior grade III/IV to tolerance; R knee flexion seatbelt mobs grade III/IV to tolerance   slighly improved patellar mobility                 PT Education - 02/09/21 0841    Education Details update to HEP; educated pt on importance of continued PT to address ROM deficits    Person(s) Educated Patient    Methods Explanation;Demonstration;Tactile cues;Handout;Verbal cues    Comprehension Verbalized understanding;Returned demonstration            PT Short Term Goals - 01/23/21 0838      PT SHORT TERM GOAL #1   Title Pt will be independent with initial HEP    Time 2    Period Weeks    Status Achieved    Target Date 01/26/21             PT Long Term Goals - 01/23/21 0932       PT LONG TERM GOAL #1   Title Pt will be independent with advanced HEP    Time 8    Period Weeks    Status Achieved    Target Date 03/09/21      PT LONG TERM GOAL #2   Title Pt will demo R knee ROM to at least 0 - 115 deg with </= 2/10 pain    Time 8    Period Weeks    Status On-going    Target Date 03/09/21      PT LONG TERM GOAL #3   Title Pt will be able to ambulate at least 100 feet without AD, </=2/10 pain, and with normalized gait pattern    Time 8    Period Weeks    Status On-going    Target Date 03/09/21      PT LONG TERM GOAL #4   Title Pt will demo BLE strength >/= 4+/5    Time 8    Period Weeks    Status On-going    Target Date 03/09/21                 Plan - 02/09/21 0842    Clinical Impression Statement Patient reporting stiffness in the R knee since increased activity d/t having to take her husband to the hospital. Worked on R knee patellar and tibiofemoral jt mobs, with patient demonstrating slightly improved patellar mobility today. Patient tolerated MT well today. Followed with R knee flexion stretching to achieve max ROM. Patient was able to achieve AROM 0-87 deg, PROM 0-93 deg. Patient was able to demonstrate good eccentric control with step ups at short step; more limited at 6". Patient expressed wanting to wrap up with PT d/t family obligations. Educated patient on importance of more therapy to address ROM limitations as patient is still not at a functional ROM. Patient wanting to discuss her options with her MD. Updated HEP with exercises that were well-tolerated today. Patient reported understanding and without complaints at end of session.    Personal Factors and Comorbidities Past/Current Experience;Comorbidity 2;Time since onset of injury/illness/exacerbation;Finances    Comorbidities HTN, L TKA 2014    PT Frequency Other (comment)  PT Duration 8 weeks    PT Treatment/Interventions ADLs/Self Care Home Management;Cryotherapy;Electrical  Stimulation;Moist Heat;Gait training;Neuromuscular re-education;Balance training;Therapeutic exercise;Therapeutic activities;Functional mobility training;Stair training;Patient/family education;Manual techniques;Energy conservation;Taping;Vasopneumatic Device    PT Next Visit Plan patellar and tibiofemoral jt mobs; work on knee flexion ROM, quad strengthening, manual and modalities as needed.    PT Home Exercise Plan see pt education    Consulted and Agree with Plan of Care Patient           Patient will benefit from skilled therapeutic intervention in order to improve the following deficits and impairments:  Abnormal gait,Decreased range of motion,Difficulty walking,Pain,Improper body mechanics,Impaired flexibility,Decreased balance,Decreased mobility,Decreased strength,Increased edema,Increased muscle spasms  Visit Diagnosis: Muscle weakness (generalized)  Acute pain of right knee  Stiffness of right knee, not elsewhere classified  Left knee pain, unspecified chronicity  Other abnormalities of gait and mobility  Localized edema     Problem List Patient Active Problem List   Diagnosis Date Noted  . OA (osteoarthritis) of knee 01/09/2021  . Preventive measure 01/25/2016  . Numbness and tingling in hands 01/18/2015  . Former smoker, stopped smoking in distant past 01/18/2015  . Hypertension 12/26/2011  . Postmenopausal status, on HRT 12/26/2011  . Knee pain, s/p TKR LEFT 12/26/2011     Anette Guarneri, PT, DPT 02/09/21 8:43 AM   [facility] [address]  [address] [place], [place], [address] Phone: [phone]   Fax:  [phone]  Name: SANTRESA LEVETT MRN: [id number] Date of Birth: 28-Mar-1951

## 2021-02-13 ENCOUNTER — Other Ambulatory Visit: Payer: Self-pay

## 2021-02-13 ENCOUNTER — Ambulatory Visit: Payer: Medicare Other

## 2021-02-13 DIAGNOSIS — M25561 Pain in right knee: Secondary | ICD-10-CM

## 2021-02-13 DIAGNOSIS — R2689 Other abnormalities of gait and mobility: Secondary | ICD-10-CM | POA: Diagnosis not present

## 2021-02-13 DIAGNOSIS — M6281 Muscle weakness (generalized): Secondary | ICD-10-CM

## 2021-02-13 DIAGNOSIS — M25661 Stiffness of right knee, not elsewhere classified: Secondary | ICD-10-CM | POA: Diagnosis not present

## 2021-02-13 DIAGNOSIS — M25562 Pain in left knee: Secondary | ICD-10-CM | POA: Diagnosis not present

## 2021-02-13 DIAGNOSIS — R600 Localized edema: Secondary | ICD-10-CM | POA: Diagnosis not present

## 2021-02-13 NOTE — Therapy (Signed)
[facility] [address]  [place], [place], [address] Phone: [phone]   Fax:  [phone]  Physical Therapy Treatment  Patient Details  Name: Holly Herring MRN: [id number] Date of Birth: [birth year] Referring Provider (PT): Gaynelle Arabian MD   Encounter Date: 02/13/2021   PT End of Session - 02/13/21 1400    Visit Number 10    Number of Visits 12    Date for PT Re-Evaluation 03/09/21    PT Start Time 1219    PT Stop Time 1358    PT Time Calculation (min) 43 min    Activity Tolerance Patient tolerated treatment well    Behavior During Therapy [facility] for tasks assessed/performed           Past Medical History:  Diagnosis Date  . Arthritis   . Hypertension   . Uterine prolapse    had a Gelhorn pessary implant    Past Surgical History:  Procedure Laterality Date  . ABDOMINAL HYSTERECTOMY  2019  . BREAST SURGERY Bilateral    breast reduction  . donut     Gelhorn  pessary for uterine prolapse  . HERNIA REPAIR    . KNEE ARTHROSCOPY     Dr Melba Coon  . OOPHORECTOMY     one ovary removed due to ectopic  . REDUCTION MAMMAPLASTY Bilateral 2002  . 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   Procedure: TOTAL KNEE ARTHROPLASTY;  Surgeon: Kerin Salen, MD;  Location: [place];  Service: Orthopedics;  Laterality: Left;  DEPUY/SIGMA  . TOTAL KNEE ARTHROPLASTY Right 01/09/2021   Procedure: TOTAL KNEE ARTHROPLASTY;  Surgeon: Gaynelle Arabian, MD;  Location: [facility];  Service: Orthopedics;  Laterality: Right;  78mn    There were no vitals filed for this visit.   Subjective Assessment - 02/13/21 1318    Subjective Pt reports her knee is stiff in the morning, trying a new pain medication since Saturday, with no new complaints    Pertinent History history of L TKA, HTN, uterine prolapse surgery    Patient Stated Goals to get back to PLOF, walking again    Currently in Pain? No/denies              [facility]  [facility]      AROM   Right Knee Extension 0    Right Knee Flexion 95      PROM   Right Knee Extension 0    Right Knee Flexion 100      Strength   Right Hip Flexion 4/5    Right Hip Extension 4-/5    Right Hip ABduction 4+/5    Right Hip ADduction 4+/5    Left Hip Flexion 4+/5    Left Hip Extension 4+/5    Left Hip ABduction 4+/5    Left Hip ADduction 4+/5    Right Knee Flexion 4+/5    Right Knee Extension 4/5    Left Knee Flexion 5/5    Left Knee Extension 5/5                         [facility] Adult PT Treatment/Exercise - 02/13/21 0001      Ambulation/Gait   Ambulation/Gait Yes    Ambulation/Gait Assistance 5: Supervision    Ambulation Distance (Feet) 100 Feet    Assistive device None    Gait Pattern Step-through pattern;Decreased stance time - right;Decreased weight shift to right;Antalgic  Exercises   Exercises Knee/Hip      Knee/Hip Exercises: Aerobic   Recumbent Bike partial revolutions 6 min      Knee/Hip Exercises: Supine   Heel Slides AAROM;Right;1 set;Limitations;20 reps    Heel Slides Limitations strap      Manual Therapy   Manual Therapy Passive ROM    Passive ROM knee flexion, contract/relax                    PT Short Term Goals - 01/23/21 7494      PT SHORT TERM GOAL #1   Title Pt will be independent with initial HEP    Time 2    Period Weeks    Status Achieved    Target Date 01/26/21             PT Long Term Goals - 02/13/21 1341      PT LONG TERM GOAL #1   Title Pt will be independent with advanced HEP    Time 8    Period Weeks    Status Achieved      PT LONG TERM GOAL #2   Title Pt will demo R knee ROM to at least 0 - 115 deg with </= 2/10 pain    Time 8    Period Weeks    Status Partially Met   0-95; 1/10 pain     PT LONG TERM GOAL #3   Title Pt will be able to ambulate at least 100 feet without AD, </=2/10 pain, and with normalized gait pattern    Time 8    Period Weeks    Status  Partially Met      PT LONG TERM GOAL #4   Title Pt will demo BLE strength >/= 4+/5    Time 8    Period Weeks    Status Partially Met   R hip flexors and knee ext 4/5; R hip ext 4-/5                Plan - 02/13/21 1401    Clinical Impression Statement Pt knee AROM increased 0-95 deg today, her ROM is improving but still needs work to meet her goal of 0-115 deg. Pt can walk w/o AD but still some compensation noted with decreased stance time on R and weight shifting. She still needs some strengthening on her R hip flexors, extensors, and knee extensors but all other muscle are at least 4+/5. Pt reports desire to wrap up therapy d/t having to take care of her husband at home. Pt has not met her LTGs but is making good progress toward all goals. She noted that she will met with her doctor on tommorrow to discuss continuation or termination of PT. Pt would benefit from cont PT to address deficits in R knee AROM, strength, and ambulation to reduce compensations and improve functional independence.    Personal Factors and Comorbidities Past/Current Experience;Comorbidity 2;Time since onset of injury/illness/exacerbation;Finances    Comorbidities HTN, L TKA 2014    PT Frequency Other (comment)    PT Duration 8 weeks    PT Treatment/Interventions ADLs/Self Care Home Management;Cryotherapy;Electrical Stimulation;Moist Heat;Gait training;Neuromuscular re-education;Balance training;Therapeutic exercise;Therapeutic activities;Functional mobility training;Stair training;Patient/family education;Manual techniques;Energy conservation;Taping;Vasopneumatic Device    PT Next Visit Plan patellar and tibiofemoral jt mobs; work on knee flexion ROM, quad strengthening, manual and modalities as needed.    PT Home Exercise Plan see pt education    Consulted and Agree with Plan of Care Patient  Patient will benefit from skilled therapeutic intervention in order to improve the following deficits and  impairments:  Abnormal gait,Decreased range of motion,Difficulty walking,Pain,Improper body mechanics,Impaired flexibility,Decreased balance,Decreased mobility,Decreased strength,Increased edema,Increased muscle spasms  Visit Diagnosis: Muscle weakness (generalized)  Acute pain of right knee  Stiffness of right knee, not elsewhere classified     Problem List Patient Active Problem List   Diagnosis Date Noted  . OA (osteoarthritis) of knee 01/09/2021  . Preventive measure 01/25/2016  . Numbness and tingling in hands 01/18/2015  . Former smoker, stopped smoking in distant past 01/18/2015  . Hypertension 12/26/2011  . Postmenopausal status, on HRT 12/26/2011  . Knee pain, s/p TKR LEFT 12/26/2011    Artist Pais, PTA 02/13/2021, 5:53 PM  [facility] [address]  [place], [place], [address] Phone: [phone]   Fax:  [phone]  Name: Holly Herring MRN: [id number] Date of Birth: 04-11-1951

## 2021-02-14 DIAGNOSIS — Z471 Aftercare following joint replacement surgery: Secondary | ICD-10-CM | POA: Diagnosis not present

## 2021-02-14 DIAGNOSIS — Z96651 Presence of right artificial knee joint: Secondary | ICD-10-CM | POA: Diagnosis not present

## 2021-02-20 ENCOUNTER — Ambulatory Visit: Payer: Medicare Other | Admitting: Physical Therapy

## 2021-02-20 ENCOUNTER — Ambulatory Visit: Payer: Medicare Other

## 2021-02-20 ENCOUNTER — Other Ambulatory Visit: Payer: Self-pay

## 2021-02-20 DIAGNOSIS — M25661 Stiffness of right knee, not elsewhere classified: Secondary | ICD-10-CM | POA: Diagnosis not present

## 2021-02-20 DIAGNOSIS — M25561 Pain in right knee: Secondary | ICD-10-CM

## 2021-02-20 DIAGNOSIS — R600 Localized edema: Secondary | ICD-10-CM | POA: Diagnosis not present

## 2021-02-20 DIAGNOSIS — M6281 Muscle weakness (generalized): Secondary | ICD-10-CM | POA: Diagnosis not present

## 2021-02-20 DIAGNOSIS — R2689 Other abnormalities of gait and mobility: Secondary | ICD-10-CM | POA: Diagnosis not present

## 2021-02-20 DIAGNOSIS — M25562 Pain in left knee: Secondary | ICD-10-CM | POA: Diagnosis not present

## 2021-02-20 NOTE — Therapy (Signed)
[facility] [address]  [place], [place], [address] Phone: [phone]   Fax:  [phone]  Physical Therapy Treatment  Patient Details  Name: Holly Herring MRN: [id number] Date of Birth: 1951/04/26 Referring Provider (PT): Gaynelle Arabian MD   Encounter Date: 02/20/2021   PT End of Session - 02/20/21 0842    Visit Number 11    Number of Visits 12    Date for PT Re-Evaluation 03/09/21    PT Start Time 0810   pt late   PT Stop Time 0841    PT Time Calculation (min) 31 min    Activity Tolerance Patient tolerated treatment well    Behavior During Therapy [facility] for tasks assessed/performed           Past Medical History:  Diagnosis Date  . Arthritis   . Hypertension   . Uterine prolapse    had a Gelhorn pessary implant    Past Surgical History:  Procedure Laterality Date  . ABDOMINAL HYSTERECTOMY  2019  . BREAST SURGERY Bilateral    breast reduction  . donut     Gelhorn  pessary for uterine prolapse  . HERNIA REPAIR    . KNEE ARTHROSCOPY     Dr Melba Coon  . OOPHORECTOMY     one ovary removed due to ectopic  . REDUCTION MAMMAPLASTY Bilateral 2002  . 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   Procedure: TOTAL KNEE ARTHROPLASTY;  Surgeon: Kerin Salen, MD;  Location: [facility] [facility];  Service: Orthopedics;  Laterality: Left;  DEPUY/SIGMA  . TOTAL KNEE ARTHROPLASTY Right 01/09/2021   Procedure: TOTAL KNEE ARTHROPLASTY;  Surgeon: Gaynelle Arabian, MD;  Location: [facility];  Service: Orthopedics;  Laterality: Right;  79mn    There were no vitals filed for this visit.   Subjective Assessment - 02/20/21 0814    Subjective Pt reports that she was sitting in the hospital over the weekend with her husband so her knee is stiff today.    Pertinent History history of L TKA, HTN, uterine prolapse surgery    Patient Stated Goals to get back to PLOF, walking again    Currently in Pain? Yes    Pain Score 3     Pain Location Knee     Pain Orientation Right;Anterior    Pain Type Acute pain;Surgical pain                             [facility] Adult PT Treatment/Exercise - 02/20/21 0001      Exercises   Exercises Knee/Hip      Knee/Hip Exercises: Aerobic   Recumbent Bike partial revolutions 4 min      Knee/Hip Exercises: Standing   Lateral Step Up Right;1 set;10 reps;Hand Hold: 0;Step Height: 6"    Forward Step Up Right;1 set;10 reps;Hand Hold: 0;Step Height: 6"    Step Down Left;1 set;10 reps;Hand Hold: 2;Step Height: 6"    Step Down Limitations cues to prevent side trunk lean      Knee/Hip Exercises: Supine   Heel Slides AAROM;Right;2 sets;10 reps;Limitations    Heel Slides Limitations strap      Manual Therapy   Manual Therapy Passive ROM;Joint mobilization    Joint Mobilization R knee flexion mobs III    Passive ROM knee flexion/ext                    PT Short Term Goals - 01/23/21  0838      PT SHORT TERM GOAL #1   Title Pt will be independent with initial HEP    Time 2    Period Weeks    Status Achieved    Target Date 01/26/21             PT Long Term Goals - 02/13/21 1341      PT LONG TERM GOAL #1   Title Pt will be independent with advanced HEP    Time 8    Period Weeks    Status Achieved      PT LONG TERM GOAL #2   Title Pt will demo R knee ROM to at least 0 - 115 deg with </= 2/10 pain    Time 8    Period Weeks    Status Partially Met   0-95; 1/10 pain     PT LONG TERM GOAL #3   Title Pt will be able to ambulate at least 100 feet without AD, </=2/10 pain, and with normalized gait pattern    Time 8    Period Weeks    Status Partially Met      PT LONG TERM GOAL #4   Title Pt will demo BLE strength >/= 4+/5    Time 8    Period Weeks    Status Partially Met   R hip flexors and knee ext 4/5; R hip ext 4-/5                Plan - 02/20/21 0843    Clinical Impression Statement Pt arrived late to session, so session was shortened. Focused on  knee flexion ROM and closed chain quad strengthening. Her knee was more stiff today s/t her being mostly at the hospital w/ her husband last weekend. She still has trouble and need cues to avoid compensation. She showed an increase in knee flexion to 96 deg after joint mobs and PROM, still needs a lot of work on these to increase knee flexion. Pt reported she will ice at home and declinded game ready today.    Personal Factors and Comorbidities Past/Current Experience;Comorbidity 2;Time since onset of injury/illness/exacerbation;Finances    Comorbidities HTN, L TKA 2014    PT Frequency Other (comment)    PT Duration 8 weeks    PT Treatment/Interventions ADLs/Self Care Home Management;Cryotherapy;Electrical Stimulation;Moist Heat;Gait training;Neuromuscular re-education;Balance training;Therapeutic exercise;Therapeutic activities;Functional mobility training;Stair training;Patient/family education;Manual techniques;Energy conservation;Taping;Vasopneumatic Device    PT Next Visit Plan patellar and tibiofemoral jt mobs; work on knee flexion ROM, quad strengthening, manual and modalities as needed.    PT Home Exercise Plan see pt education    Consulted and Agree with Plan of Care Patient           Patient will benefit from skilled therapeutic intervention in order to improve the following deficits and impairments:  Abnormal gait,Decreased range of motion,Difficulty walking,Pain,Improper body mechanics,Impaired flexibility,Decreased balance,Decreased mobility,Decreased strength,Increased edema,Increased muscle spasms  Visit Diagnosis: Muscle weakness (generalized)  Acute pain of right knee  Stiffness of right knee, not elsewhere classified     Problem List Patient Active Problem List   Diagnosis Date Noted  . OA (osteoarthritis) of knee 01/09/2021  . Preventive measure 01/25/2016  . Numbness and tingling in hands 01/18/2015  . Former smoker, stopped smoking in distant past 01/18/2015  .  Hypertension 12/26/2011  . Postmenopausal status, on HRT 12/26/2011  . Knee pain, s/p TKR LEFT 12/26/2011    Artist Pais, PTA 02/20/2021, 9:23 AM  [facility] [address]  [place], [place], [address] Phone: [phone]   Fax:  [phone]  Name: Holly Herring MRN: [id number] Date of Birth: [birth year]

## 2021-02-23 ENCOUNTER — Ambulatory Visit: Payer: Medicare Other

## 2021-02-23 ENCOUNTER — Other Ambulatory Visit: Payer: Self-pay

## 2021-02-23 DIAGNOSIS — R2689 Other abnormalities of gait and mobility: Secondary | ICD-10-CM | POA: Diagnosis not present

## 2021-02-23 DIAGNOSIS — M6281 Muscle weakness (generalized): Secondary | ICD-10-CM | POA: Diagnosis not present

## 2021-02-23 DIAGNOSIS — R600 Localized edema: Secondary | ICD-10-CM | POA: Diagnosis not present

## 2021-02-23 DIAGNOSIS — M25561 Pain in right knee: Secondary | ICD-10-CM

## 2021-02-23 DIAGNOSIS — M25562 Pain in left knee: Secondary | ICD-10-CM | POA: Diagnosis not present

## 2021-02-23 DIAGNOSIS — M25661 Stiffness of right knee, not elsewhere classified: Secondary | ICD-10-CM | POA: Diagnosis not present

## 2021-02-23 NOTE — Therapy (Signed)
[facility] [address]  [place], [place], [address] Phone: [phone]   Fax:  [phone]  Physical Therapy Treatment  Patient Details  Name: Holly Herring MRN: [id number] Date of Birth: 1951-11-05 Referring Provider (PT): Gaynelle Arabian MD   Encounter Date: 02/23/2021   PT End of Session - 02/23/21 0842    Visit Number 12    Number of Visits 18    Date for PT Re-Evaluation 03/16/21    Authorization Type UHC Medicare    PT Start Time 0800    PT Stop Time 0842    PT Time Calculation (min) 42 min    Activity Tolerance Patient tolerated treatment well    Behavior During Therapy [facility] for tasks assessed/performed           Past Medical History:  Diagnosis Date  . Arthritis   . Hypertension   . Uterine prolapse    had a Gelhorn pessary implant    Past Surgical History:  Procedure Laterality Date  . ABDOMINAL HYSTERECTOMY  2019  . BREAST SURGERY Bilateral    breast reduction  . donut     Gelhorn  pessary for uterine prolapse  . HERNIA REPAIR    . KNEE ARTHROSCOPY     Dr Melba Coon  . OOPHORECTOMY     one ovary removed due to ectopic  . REDUCTION MAMMAPLASTY Bilateral 2002  . 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   Procedure: TOTAL KNEE ARTHROPLASTY;  Surgeon: Kerin Salen, MD;  Location: [facility];  Service: Orthopedics;  Laterality: Left;  DEPUY/SIGMA  . TOTAL KNEE ARTHROPLASTY Right 01/09/2021   Procedure: TOTAL KNEE ARTHROPLASTY;  Surgeon: Gaynelle Arabian, MD;  Location: [facility];  Service: Orthopedics;  Laterality: Right;  35mn    There were no vitals filed for this visit.   Subjective Assessment - 02/23/21 0804    Subjective Pt reports she has been doing good.    Pertinent History history of L TKA, HTN, uterine prolapse surgery    Patient Stated Goals to get back to PLOF, walking again    Currently in Pain? Yes    Pain Score 1     Pain Location Knee    Pain Orientation Right;Anterior;Lateral     Pain Type Acute pain;Surgical pain              [facility] PT Assessment - 02/23/21 0001      Assessment   Medical Diagnosis R TKA    Referring Provider (PT) AGaynelle ArabianMD    Onset Date/Surgical Date 01/09/21      AROM   Right Knee Extension 0    Right Knee Flexion 99      Strength   Right Hip Flexion 4+/5    Right Hip ABduction 4+/5    Right Hip ADduction 4+/5    Left Hip Flexion 5/5    Left Hip ABduction 4+/5    Left Hip ADduction 4+/5    Right Knee Flexion 4+/5    Right Knee Extension 4+/5    Left Knee Flexion 5/5    Left Knee Extension 5/5                         [facility] Adult PT Treatment/Exercise - 02/23/21 0001      Ambulation/Gait   Ambulation/Gait Yes    Ambulation/Gait Assistance 5: Supervision    Ambulation Distance (Feet) 100 Feet    Assistive device None  Gait Pattern Step-through pattern;Decreased hip/knee flexion - right;Decreased weight shift to right;Lateral trunk lean to left    Gait Comments Pt showing more fluent gait pattern but needing work on increasing knee flexion and WS on the R      Exercises   Exercises Knee/Hip      Knee/Hip Exercises: Stretches   Hip Flexor Stretch Right;2 reps;30 seconds    Hip Flexor Stretch Limitations mod thomas    Other Knee/Hip Stretches supine knee flexion stretch 2x30 sec      Knee/Hip Exercises: Aerobic   Recumbent Bike partial, some full revolutions intermittent 6 min      Knee/Hip Exercises: Supine   Heel Slides AAROM;Right;2 sets;10 reps    Heel Slides Limitations with strap      Manual Therapy   Manual Therapy Passive ROM;Joint mobilization    Joint Mobilization R knee flexion mobs grade III/IV    Passive ROM knee flexion/ext                    PT Short Term Goals - 01/23/21 7903      PT SHORT TERM GOAL #1   Title Pt will be independent with initial HEP    Time 2    Period Weeks    Status Achieved    Target Date 01/26/21             PT Long Term Goals -  02/23/21 1203      PT LONG TERM GOAL #1   Title Pt will be independent with advanced HEP    Time 8    Period Weeks    Status Achieved      PT LONG TERM GOAL #2   Title Pt will demo R knee ROM to at least 0 - 115 deg with </= 2/10 pain    Time 8    Period Weeks    Status Partially Met   0-99; 1/10 pain   Target Date 03/16/21      PT LONG TERM GOAL #3   Title Pt will be able to ambulate at least 100 feet without AD, </=2/10 pain, and with normalized gait pattern    Time 8    Period Weeks    Status Partially Met   Able to ambulate w/o AD, no pain but still mild deviation   Target Date 03/16/21      PT LONG TERM GOAL #4   Title Pt will demo BLE strength >/= 4+/5    Time 8    Period Weeks    Status Achieved                 Plan - 02/23/21 8333    Clinical Impression Statement Pt has met LTG 4 and is making progress toward all other goals. She can ambulate w/o an AD and w/o pain but showing mild deviation with decreased WS to the R and knee flexion during swing. Knee ROM has increased to 0-99 deg, still could use more work on increased ROM to reach goal of 0-115 deg. She had a good response to treatment today and was able to make full rev around recumbent bike today. Cues were given to keep good positioning of the knees and to correctly perform the exercises. Pt will benefit from continued PT to address deficits in knee ROM and to normalize gait pattern to increase functional independence.    Personal Factors and Comorbidities Past/Current Experience;Comorbidity 2;Time since onset of injury/illness/exacerbation;Finances    Comorbidities HTN, L TKA  2014    PT Frequency 2x / week    PT Duration 3 weeks    PT Treatment/Interventions ADLs/Self Care Home Management;Cryotherapy;Electrical Stimulation;Moist Heat;Gait training;Neuromuscular re-education;Balance training;Therapeutic exercise;Therapeutic activities;Functional mobility training;Stair training;Patient/family education;Manual  techniques;Energy conservation;Taping;Vasopneumatic Device    PT Next Visit Plan patellar and tibiofemoral jt mobs; work on knee flexion ROM, quad strengthening, manual and modalities as needed.    PT Home Exercise Plan see pt education    Consulted and Agree with Plan of Care Patient           Patient will benefit from skilled therapeutic intervention in order to improve the following deficits and impairments:  Abnormal gait,Decreased range of motion,Difficulty walking,Pain,Improper body mechanics,Impaired flexibility,Decreased balance,Decreased mobility,Decreased strength,Increased edema,Increased muscle spasms  Visit Diagnosis: Muscle weakness (generalized)  Acute pain of right knee  Stiffness of right knee, not elsewhere classified     Problem List Patient Active Problem List   Diagnosis Date Noted  . OA (osteoarthritis) of knee 01/09/2021  . Preventive measure 01/25/2016  . Numbness and tingling in hands 01/18/2015  . Former smoker, stopped smoking in distant past 01/18/2015  . Hypertension 12/26/2011  . Postmenopausal status, on HRT 12/26/2011  . Knee pain, s/p TKR LEFT 12/26/2011     Artist Pais, PTA 02/23/2021, 12:05 PM  [facility] [address]  [place], [place], [address] Phone: [phone]   Fax:  [phone]  Name: KELITA WALLIS MRN: [id number] Date of Birth: [birth year]  Patient still demonstrated limited R knee ROM, preventing her from demonstrating a normal gait pattern and difficulty navigating her environment. Patient does show good improvement in strength measurements, but would benefit from continued skilled PT services 2x/week for 3 weeks to address joint mobility and ROM.   Janene Harvey, PT, DPT 02/23/21 12:12 PM

## 2021-02-27 ENCOUNTER — Ambulatory Visit: Payer: Medicare Other | Attending: Orthopaedic Surgery

## 2021-02-27 ENCOUNTER — Other Ambulatory Visit: Payer: Self-pay

## 2021-02-27 DIAGNOSIS — M25661 Stiffness of right knee, not elsewhere classified: Secondary | ICD-10-CM | POA: Insufficient documentation

## 2021-02-27 DIAGNOSIS — M6281 Muscle weakness (generalized): Secondary | ICD-10-CM

## 2021-02-27 DIAGNOSIS — M25561 Pain in right knee: Secondary | ICD-10-CM | POA: Insufficient documentation

## 2021-02-27 DIAGNOSIS — R600 Localized edema: Secondary | ICD-10-CM | POA: Insufficient documentation

## 2021-02-27 DIAGNOSIS — M25562 Pain in left knee: Secondary | ICD-10-CM | POA: Diagnosis not present

## 2021-02-27 DIAGNOSIS — R2689 Other abnormalities of gait and mobility: Secondary | ICD-10-CM | POA: Insufficient documentation

## 2021-02-27 NOTE — Therapy (Signed)
[facility] [address]  [place], [place], [address] Phone: [phone]   Fax:  [phone]  Physical Therapy Treatment  Patient Details  Name: Holly Herring MRN: [id number] Date of Birth: Jun 17, 1951 Referring Provider (PT): Gaynelle Arabian MD   Encounter Date: 02/27/2021   PT End of Session - 02/27/21 0845    Visit Number 13    Number of Visits 18    Date for PT Re-Evaluation 03/16/21    Authorization Type [employer] Medicare    PT Start Time 0801    PT Stop Time 0844    PT Time Calculation (min) 43 min    Activity Tolerance Patient tolerated treatment well    Behavior During Therapy [facility] for tasks assessed/performed           Past Medical History:  Diagnosis Date  . Arthritis   . Hypertension   . Uterine prolapse    had a Gelhorn pessary implant    Past Surgical History:  Procedure Laterality Date  . ABDOMINAL HYSTERECTOMY  2019  . BREAST SURGERY Bilateral    breast reduction  . donut     Gelhorn  pessary for uterine prolapse  . HERNIA REPAIR    . KNEE ARTHROSCOPY     Dr Melba Coon  . OOPHORECTOMY     one ovary removed due to ectopic  . REDUCTION MAMMAPLASTY Bilateral 2002  . 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   Procedure: TOTAL KNEE ARTHROPLASTY;  Surgeon: Kerin Salen, MD;  Location: [facility];  Service: Orthopedics;  Laterality: Left;  DEPUY/SIGMA  . TOTAL KNEE ARTHROPLASTY Right 01/09/2021   Procedure: TOTAL KNEE ARTHROPLASTY;  Surgeon: Gaynelle Arabian, MD;  Location[facility];  Service: Orthopedics;  Laterality: Right;  29mn    There were no vitals filed for this visit.   Subjective Assessment - 02/27/21 0805    Subjective Pt is doing well.    Pertinent History history of L TKA, HTN, uterine prolapse surgery    Patient Stated Goals to get back to PLOF, walking again    Currently in Pain? No/denies                             [facility] 0001       Exercises   Exercises Knee/Hip      Knee/Hip Exercises: Stretches   Other Knee/Hip Stretches supine knee flexion stretch with 2#, therapist overpressure 3x30 sec      Knee/Hip Exercises: Aerobic   Recumbent Bike partial rev 6 min      Knee/Hip Exercises: Standing   Lateral Step Up Right;1 set;10 reps;Hand Hold: 1;Step Height: 8"    Forward Step Up Right;1 set;10 reps;Hand Hold: 2;Step Height: 8"    Forward Step Up Limitations cues to prevent circumduction    Step Down Left;2 sets;10 reps;Hand Hold: 2;Step Height: 6"    Step Down Limitations cues to keep upright posture    Functional Squat 1 set;15 reps    Functional Squat Limitations TRX squat      Knee/Hip Exercises: Seated   Sit to Sand 1 set;10 reps;without UE support      Manual Therapy   Manual Therapy Passive ROM    Passive ROM knee flexion/ext; contract and relax into flex                    PT Short Term Goals - 01/23/21 09371  PT SHORT TERM GOAL #1   Title Pt will be independent with initial HEP    Time 2    Period Weeks    Status Achieved    Target Date 01/26/21             PT Long Term Goals - 02/23/21 1203      PT LONG TERM GOAL #1   Title Pt will be independent with advanced HEP    Time 8    Period Weeks    Status Achieved      PT LONG TERM GOAL #2   Title Pt will demo R knee ROM to at least 0 - 115 deg with </= 2/10 pain    Time 8    Period Weeks    Status Partially Met   0-99; 1/10 pain   Target Date 03/16/21      PT LONG TERM GOAL #3   Title Pt will be able to ambulate at least 100 feet without AD, </=2/10 pain, and with normalized gait pattern    Time 8    Period Weeks    Status Partially Met   Able to ambulate w/o AD, no pain but still mild deviation   Target Date 03/16/21      PT LONG TERM GOAL #4   Title Pt will demo BLE strength >/= 4+/5    Time 8    Period Weeks    Status Achieved                 Plan - 02/27/21 0845    Clinical Impression  Statement Pt responded well to treatment w/o any reports of pain. She was more stiff today and presented with more swelling in her R knee. Reminded pt the importance of using ice to minimize swelling and frequency of use. Continuing to focus on closed chain quad strength and knee ROM, cues needed during exercises to avoid substitutions with the trunk and circumduction with the R LE during standing exercises.Pt declind game ready post session but reported that she will ice at home.    Personal Factors and Comorbidities Past/Current Experience;Comorbidity 2;Time since onset of injury/illness/exacerbation;Finances    Comorbidities HTN, L TKA 2014    PT Frequency 2x / week    PT Duration 3 weeks    PT Treatment/Interventions ADLs/Self Care Home Management;Cryotherapy;Electrical Stimulation;Moist Heat;Gait training;Neuromuscular re-education;Balance training;Therapeutic exercise;Therapeutic activities;Functional mobility training;Stair training;Patient/family education;Manual techniques;Energy conservation;Taping;Vasopneumatic Device    PT Next Visit Plan patellar and tibiofemoral jt mobs; work on knee flexion ROM, quad strengthening, manual and modalities as needed.    PT Home Exercise Plan see pt education    Consulted and Agree with Plan of Care Patient           Patient will benefit from skilled therapeutic intervention in order to improve the following deficits and impairments:  Abnormal gait,Decreased range of motion,Difficulty walking,Pain,Improper body mechanics,Impaired flexibility,Decreased balance,Decreased mobility,Decreased strength,Increased edema,Increased muscle spasms  Visit Diagnosis: Muscle weakness (generalized)  Acute pain of right knee  Stiffness of right knee, not elsewhere classified  Other abnormalities of gait and mobility     Problem List Patient Active Problem List   Diagnosis Date Noted  . OA (osteoarthritis) of knee 01/09/2021  . Preventive measure 01/25/2016   . Numbness and tingling in hands 01/18/2015  . Former smoker, stopped smoking in distant past 01/18/2015  . Hypertension 12/26/2011  . Postmenopausal status, on HRT 12/26/2011  . Knee pain, s/p TKR LEFT 12/26/2011    Artist Pais,  PTA 02/27/2021, 8:56 AM  [facility] [address]  [place], [place], [address] Phone: [phone]   Fax:  [phone]  Name: Holly Herring MRN: [id number] Date of Birth: [birth year]

## 2021-03-02 ENCOUNTER — Ambulatory Visit: Payer: Medicare Other | Admitting: Physical Therapy

## 2021-03-02 ENCOUNTER — Encounter: Payer: Self-pay | Admitting: Physical Therapy

## 2021-03-02 ENCOUNTER — Other Ambulatory Visit: Payer: Self-pay

## 2021-03-02 DIAGNOSIS — M25562 Pain in left knee: Secondary | ICD-10-CM | POA: Diagnosis not present

## 2021-03-02 DIAGNOSIS — M25661 Stiffness of right knee, not elsewhere classified: Secondary | ICD-10-CM | POA: Diagnosis not present

## 2021-03-02 DIAGNOSIS — M25561 Pain in right knee: Secondary | ICD-10-CM | POA: Diagnosis not present

## 2021-03-02 DIAGNOSIS — R600 Localized edema: Secondary | ICD-10-CM

## 2021-03-02 DIAGNOSIS — M6281 Muscle weakness (generalized): Secondary | ICD-10-CM

## 2021-03-02 DIAGNOSIS — R2689 Other abnormalities of gait and mobility: Secondary | ICD-10-CM

## 2021-03-02 NOTE — Therapy (Signed)
[facility] [place] [address]  [place], [place], [address] Phone: [phone]   Fax:  [phone]  Physical Therapy Treatment  Patient Details  Name: Holly Herring MRN: [id number] Date of Birth: May 03, 1951 Referring Provider (PT): Gaynelle Arabian MD   Encounter Date: 03/02/2021   PT End of Session - 03/02/21 0847    Visit Number 14    Number of Visits 18    Date for PT Re-Evaluation 03/16/21    Authorization Type UHC Medicare    PT Start Time 0759    PT Stop Time 0855    PT Time Calculation (min) 56 min    Activity Tolerance Patient tolerated treatment well;Patient limited by pain    Behavior During Therapy [facility] for tasks assessed/performed           Past Medical History:  Diagnosis Date  . Arthritis   . Hypertension   . Uterine prolapse    had a Gelhorn pessary implant    Past Surgical History:  Procedure Laterality Date  . ABDOMINAL HYSTERECTOMY  2019  . BREAST SURGERY Bilateral    breast reduction  . donut     Gelhorn  pessary for uterine prolapse  . HERNIA REPAIR    . KNEE ARTHROSCOPY     Dr Melba Coon  . OOPHORECTOMY     one ovary removed due to ectopic  . REDUCTION MAMMAPLASTY Bilateral 2002  . 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   Procedure: TOTAL KNEE ARTHROPLASTY;  Surgeon: Kerin Salen, MD;  Location: [facility];  Service: Orthopedics;  Laterality: Left;  DEPUY/SIGMA  . TOTAL KNEE ARTHROPLASTY Right 01/09/2021   Procedure: TOTAL KNEE ARTHROPLASTY;  Surgeon: Gaynelle Arabian, MD;  Location: [facility];  Service: Orthopedics;  Laterality: Right;  47mn    There were no vitals filed for this visit.   Subjective Assessment - 03/02/21 0758    Subjective Was able to take a bath yesterday for the first time.    Pertinent History history of L TKA, HTN, uterine prolapse surgery    Patient Stated Goals to get back to PLOF, walking again    Currently in Pain? No/denies              [facility]  [facility]      AROM   Right Knee Flexion 90      PROM   Right Knee Flexion 94                         [facility] Adult PT Treatment/Exercise - 03/02/21 0001      Knee/Hip Exercises: Stretches   Hip Flexor Stretch Right;2 reps;30 seconds    Hip Flexor Stretch Limitations mod thomas    Other Knee/Hip Stretches R knee flexion stretch at TM 10x5"    Other Knee/Hip Stretches sitting R knee flexion stretch 10x5" to tolerance      Knee/Hip Exercises: Aerobic   Recumbent Bike partial rev 6 min; additional 5 min full revolutions on with seat on level 5      Knee/Hip Exercises: Machines for Strengthening   Cybex Leg Press R LE 10# 2x10   focusing on knee flexion stretch     Knee/Hip Exercises: Standing   Lateral Step Up Right;1 set;10 reps;Hand Hold: 2;Step Height: 6"    Lateral Step Up Limitations R lateral step up + down with heel first   excessive trunk lean and UE support   Step Down  Left;2 sets;Step Height: 6";5 reps;Hand Hold: 1;Step Height: 4"    Step Down Limitations R step up/over with heel touch   cues to improved eccentric control     Vasopneumatic   Number Minutes Vasopneumatic  10 minutes    Vasopnuematic Location  Knee   R   Vasopneumatic Pressure Low    Vasopneumatic Temperature  34      Manual Therapy   Manual Therapy Joint mobilization    Joint Mobilization R patellar mobs all directions grade III/IV; R knee seat belt mobs for knee flexion grade III   muscle guarding and pain                   PT Short Term Goals - 01/23/21 1610      PT SHORT TERM GOAL #1   Title Pt will be independent with initial HEP    Time 2    Period Weeks    Status Achieved    Target Date 01/26/21             PT Long Term Goals - 02/23/21 1203      PT LONG TERM GOAL #1   Title Pt will be independent with advanced HEP    Time 8    Period Weeks    Status Achieved      PT LONG TERM GOAL #2   Title Pt will demo R knee ROM to at least 0 - 115 deg with </=  2/10 pain    Time 8    Period Weeks    Status Partially Met   0-99; 1/10 pain   Target Date 03/16/21      PT LONG TERM GOAL #3   Title Pt will be able to ambulate at least 100 feet without AD, </=2/10 pain, and with normalized gait pattern    Time 8    Period Weeks    Status Partially Met   Able to ambulate w/o AD, no pain but still mild deviation   Target Date 03/16/21      PT LONG TERM GOAL #4   Title Pt will demo BLE strength >/= 4+/5    Time 8    Period Weeks    Status Achieved                 Plan - 03/02/21 0848    Clinical Impression Statement Patient arrived to session with report that she was able to get into the tub for a bath for the first time yesterday. Worked on R knee patellar and tibiofemoral joint mobs for improved flexion ROM. Patient demonstrating improved patellar mobility in superior and inferior directions today, however limited by pain and muscle guarding with tibiofemoral mobs. Proceeded with aggressive R knee flexion stretching within tolerance. Initiated single leg leg press with focus on attaining max knee flexion stretch. Patient able to demonstrate good knee stability with this exercise. Despite joint mobilizations and stretching performed today, patient was limited to 90 degrees flexion AROM, 94 degrees PROM today. Ended session with [employer] for post exercise soreness. No complaints at end of session.    Personal Factors and Comorbidities Past/Current Experience;Comorbidity 2;Time since onset of injury/illness/exacerbation;Finances    Comorbidities HTN, L TKA 2014    PT Frequency 2x / week    PT Duration 3 weeks    PT Treatment/Interventions ADLs/Self Care Home Management;Cryotherapy;Electrical Stimulation;Moist Heat;Gait training;Neuromuscular re-education;Balance training;Therapeutic exercise;Therapeutic activities;Functional mobility training;Stair training;Patient/family education;Manual techniques;Energy conservation;Taping;Vasopneumatic Device     PT Next Visit Plan patellar and  tibiofemoral jt mobs; work on knee flexion ROM, quad strengthening, manual and modalities as needed.    PT Home Exercise Plan see pt education    Consulted and Agree with Plan of Care Patient           Patient will benefit from skilled therapeutic intervention in order to improve the following deficits and impairments:  Abnormal gait,Decreased range of motion,Difficulty walking,Pain,Improper body mechanics,Impaired flexibility,Decreased balance,Decreased mobility,Decreased strength,Increased edema,Increased muscle spasms  Visit Diagnosis: Muscle weakness (generalized)  Acute pain of right knee  Stiffness of right knee, not elsewhere classified  Other abnormalities of gait and mobility  Left knee pain, unspecified chronicity  Localized edema     Problem List Patient Active Problem List   Diagnosis Date Noted  . OA (osteoarthritis) of knee 01/09/2021  . Preventive measure 01/25/2016  . Numbness and tingling in hands 01/18/2015  . Former smoker, stopped smoking in distant past 01/18/2015  . Hypertension 12/26/2011  . Postmenopausal status, on HRT 12/26/2011  . Knee pain, s/p TKR LEFT 12/26/2011     Janene Harvey, PT, DPT 03/02/21 8:57 AM   [facility] [address]  [place], [place], [address] Phone: [phone]   Fax:  [phone]  Name: Holly Herring MRN: [id number] Date of Birth: 01-25-1951

## 2021-03-06 ENCOUNTER — Other Ambulatory Visit: Payer: Self-pay

## 2021-03-06 ENCOUNTER — Encounter: Payer: Self-pay | Admitting: Physical Therapy

## 2021-03-06 ENCOUNTER — Ambulatory Visit: Payer: Medicare Other | Admitting: Physical Therapy

## 2021-03-06 DIAGNOSIS — M25661 Stiffness of right knee, not elsewhere classified: Secondary | ICD-10-CM

## 2021-03-06 DIAGNOSIS — M6281 Muscle weakness (generalized): Secondary | ICD-10-CM

## 2021-03-06 DIAGNOSIS — M25561 Pain in right knee: Secondary | ICD-10-CM | POA: Diagnosis not present

## 2021-03-06 DIAGNOSIS — M25562 Pain in left knee: Secondary | ICD-10-CM

## 2021-03-06 DIAGNOSIS — R2689 Other abnormalities of gait and mobility: Secondary | ICD-10-CM

## 2021-03-06 DIAGNOSIS — R600 Localized edema: Secondary | ICD-10-CM

## 2021-03-06 NOTE — Therapy (Signed)
[facility] [place] [address]  [place], [place], [address] Phone: [phone]   Fax:  [phone]  Physical Therapy Treatment  Patient Details  Name: Holly Herring MRN: [id number] Date of Birth: 1951-05-29 Referring Provider (PT): Gaynelle Arabian MD   Encounter Date: 03/06/2021   PT End of Session - 03/06/21 0843    Visit Number 15    Number of Visits 18    Date for PT Re-Evaluation 03/16/21    Authorization Type UHC Medicare    PT Start Time 0806    PT Stop Time 0845    PT Time Calculation (min) 39 min    Activity Tolerance Patient tolerated treatment well;Patient limited by pain    Behavior During Therapy [facility] for tasks assessed/performed           Past Medical History:  Diagnosis Date  . Arthritis   . Hypertension   . Uterine prolapse    had a Gelhorn pessary implant    Past Surgical History:  Procedure Laterality Date  . ABDOMINAL HYSTERECTOMY  2019  . BREAST SURGERY Bilateral    breast reduction  . donut     Gelhorn  pessary for uterine prolapse  . HERNIA REPAIR    . KNEE ARTHROSCOPY     Dr Melba Coon  . OOPHORECTOMY     one ovary removed due to ectopic  . REDUCTION MAMMAPLASTY Bilateral 2002  . 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   Procedure: TOTAL KNEE ARTHROPLASTY;  Surgeon: Kerin Salen, MD;  Location: [facility];  Service: Orthopedics;  Laterality: Left;  DEPUY/SIGMA  . TOTAL KNEE ARTHROPLASTY Right 01/09/2021   Procedure: TOTAL KNEE ARTHROPLASTY;  Surgeon: Gaynelle Arabian, MD;  Location: [facility];  Service: Orthopedics;  Laterality: Right;  66mn    There were no vitals filed for this visit.   Subjective Assessment - 03/06/21 0805    Subjective Did a lot of stretching over the weekend but feeling stiff this AM. Took a pain pill today as she was sore after last session.    Pertinent History history of L TKA, HTN, uterine prolapse surgery    Patient Stated Goals to get back to PLOF, walking again     Currently in Pain? No/denies              [facility]      AROM   Right Knee Flexion 92      PROM   Right Knee Flexion 96                         [facility] Adult PT Treatment/Exercise - 03/06/21 0001      Knee/Hip Exercises: Stretches   QPension scheme managerreps;30 seconds    Quad Stretch Limitations prone with strap    Other Knee/Hip Stretches R knee flexion stretch on step 10x5"      Knee/Hip Exercises: Aerobic   Recumbent Bike partial/full rev 6 min with seat on level 5      Knee/Hip Exercises: Machines for Strengthening   Cybex Knee Extension B LEs 5x 10#, 5x 15#; R LE 10x 15#    Cybex Knee Flexion B LEs 10x 15-20#, R LE 10x 10#    Cybex Leg Press R LE 10# x15      Knee/Hip Exercises: Standing   Functional Squat 2 sets;10 reps    Functional Squat Limitations TRX squat   good even wt shift  Manual Therapy   Manual Therapy Joint mobilization    Joint Mobilization R patellar mobs all directions grade III/IV; R knee flexion AP mobs over proximal tibial grade III/IV with prolonged knee flexion holds                  PT Education - 03/06/21 0842    Education Details Advised patient to discussed future treatment options with her MD at upcoming F/U appointment d/t slight plateau in ROM    Person(s) Educated Patient    Methods Explanation    Comprehension Verbalized understanding            PT Short Term Goals - 01/23/21 0838      PT SHORT TERM GOAL #1   Title Pt will be independent with initial HEP    Time 2    Period Weeks    Status Achieved    Target Date 01/26/21             PT Long Term Goals - 02/23/21 1203      PT LONG TERM GOAL #1   Title Pt will be independent with advanced HEP    Time 8    Period Weeks    Status Achieved      PT LONG TERM GOAL #2   Title Pt will demo R knee ROM to at least 0 - 115 deg with </= 2/10 pain    Time 8    Period Weeks    Status Partially Met   0-99; 1/10 pain    Target Date 03/16/21      PT LONG TERM GOAL #3   Title Pt will be able to ambulate at least 100 feet without AD, </=2/10 pain, and with normalized gait pattern    Time 8    Period Weeks    Status Partially Met   Able to ambulate w/o AD, no pain but still mild deviation   Target Date 03/16/21      PT LONG TERM GOAL #4   Title Pt will demo BLE strength >/= 4+/5    Time 8    Period Weeks    Status Achieved                 Plan - 03/06/21 0844    Clinical Impression Statement Patient reporting stiffness but no pain in the R knee this AM. Worked on R knee flexion stretching to patient's tolerance. Initiated weighted quad and HS strengthening with patient requiring cues to decrease speed and increase muscle control with eccentric phase. More challenge demonstrated with HS vs. quad activation. Patient with good maintenance of patellar mobility and with good tibiofemoral joint mobility with mobs today, however patient still limited to 92 degrees AROM and 96 degrees PROM after MT. Patient demonstrated good form and even weight shift with squats today. Advised patient to discussed future treatment options with her MD at upcoming F/U appointment d/t slight plateau in ROM. Patient agreeable and without complaints at end of session.    Personal Factors and Comorbidities Past/Current Experience;Comorbidity 2;Time since onset of injury/illness/exacerbation;Finances    Comorbidities HTN, L TKA 2014    PT Frequency 2x / week    PT Duration 3 weeks    PT Treatment/Interventions ADLs/Self Care Home Management;Cryotherapy;Electrical Stimulation;Moist Heat;Gait training;Neuromuscular re-education;Balance training;Therapeutic exercise;Therapeutic activities;Functional mobility training;Stair training;Patient/family education;Manual techniques;Energy conservation;Taping;Vasopneumatic Device    PT Next Visit Plan patellar and tibiofemoral jt mobs; work on knee flexion ROM, quad strengthening, manual and  modalities as needed.      PT Home Exercise Plan see pt education    Consulted and Agree with Plan of Care Patient           Patient will benefit from skilled therapeutic intervention in order to improve the following deficits and impairments:  Abnormal gait,Decreased range of motion,Difficulty walking,Pain,Improper body mechanics,Impaired flexibility,Decreased balance,Decreased mobility,Decreased strength,Increased edema,Increased muscle spasms  Visit Diagnosis: Muscle weakness (generalized)  Acute pain of right knee  Stiffness of right knee, not elsewhere classified  Other abnormalities of gait and mobility  Left knee pain, unspecified chronicity  Localized edema     Problem List Patient Active Problem List   Diagnosis Date Noted  . OA (osteoarthritis) of knee 01/09/2021  . Preventive measure 01/25/2016  . Numbness and tingling in hands 01/18/2015  . Former smoker, stopped smoking in distant past 01/18/2015  . Hypertension 12/26/2011  . Postmenopausal status, on HRT 12/26/2011  . Knee pain, s/p TKR LEFT 12/26/2011     Yevgeniya Kovalenko, PT, DPT 03/06/21 8:46 AM   [facility] [place] [address]  [address] [place], , [address] Phone: [phone]   Fax:  [phone]  Name: Holly Herring MRN: [id number] Date of Birth: 06/07/1951   

## 2021-03-09 ENCOUNTER — Other Ambulatory Visit: Payer: Self-pay

## 2021-03-09 ENCOUNTER — Ambulatory Visit: Payer: Medicare Other

## 2021-03-09 DIAGNOSIS — R2689 Other abnormalities of gait and mobility: Secondary | ICD-10-CM | POA: Diagnosis not present

## 2021-03-09 DIAGNOSIS — M25561 Pain in right knee: Secondary | ICD-10-CM

## 2021-03-09 DIAGNOSIS — M25562 Pain in left knee: Secondary | ICD-10-CM | POA: Diagnosis not present

## 2021-03-09 DIAGNOSIS — R600 Localized edema: Secondary | ICD-10-CM | POA: Diagnosis not present

## 2021-03-09 DIAGNOSIS — M6281 Muscle weakness (generalized): Secondary | ICD-10-CM | POA: Diagnosis not present

## 2021-03-09 DIAGNOSIS — M25661 Stiffness of right knee, not elsewhere classified: Secondary | ICD-10-CM | POA: Diagnosis not present

## 2021-03-09 NOTE — Therapy (Addendum)
[facility] [place] [address]  [address] [place], [place], [address] Phone: [phone]   Fax:  [phone]  Physical Therapy Treatment  Patient Details  Name: Holly Herring MRN: [id number] Date of Birth: 03/12/1951 Referring Provider (PT): Aluisio, Frank MD  Progress Note Reporting Period 02/20/21 to 03/09/21  See note below for Objective Data and Assessment of Progress/Goals.     Encounter Date: 03/09/2021   PT End of Session - 03/09/21 0930    Visit Number 16    Number of Visits 18    Date for PT Re-Evaluation 03/16/21    Authorization Type UHC Medicare    PT Start Time 0843    PT Stop Time 0927    PT Time Calculation (min) 44 min    Activity Tolerance Patient tolerated treatment well    Behavior During Therapy [facility] for tasks assessed/performed           Past Medical History:  Diagnosis Date  . Arthritis   . Hypertension   . Uterine prolapse    had a Gelhorn pessary implant    Past Surgical History:  Procedure Laterality Date  . ABDOMINAL HYSTERECTOMY  2019  . BREAST SURGERY Bilateral    breast reduction  . donut     Gelhorn  pessary for uterine prolapse  . HERNIA REPAIR    . KNEE ARTHROSCOPY     Dr Goffre  . OOPHORECTOMY     one ovary removed due to ectopic  . REDUCTION MAMMAPLASTY Bilateral 2002  . 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   Procedure: TOTAL KNEE ARTHROPLASTY;  Surgeon: Frank J Rowan, MD;  Location: [facility];  Service: Orthopedics;  Laterality: Left;  DEPUY/SIGMA  . TOTAL KNEE ARTHROPLASTY Right 01/09/2021   Procedure: TOTAL KNEE ARTHROPLASTY;  Surgeon: Aluisio, Frank, MD;  Location: [facility];  Service: Orthopedics;  Laterality: Right;  50min    There were no vitals filed for this visit.   Subjective Assessment - 03/09/21 0846    Subjective Pt reports that her R knee is still stiff,  plans on speaking with doctor about manipulation.    Pertinent History history of L TKA, HTN, uterine  prolapse surgery    Patient Stated Goals to get back to PLOF, walking again    Currently in Pain? No/denies              [facility] PT Assessment - 03/09/21 0001      AROM   Right Knee Flexion 95                         [facility] Adult PT Treatment/Exercise - 03/09/21 0001      Ambulation/Gait   Ambulation/Gait Yes    Ambulation/Gait Assistance 5: Supervision    Ambulation Distance (Feet) 100 Feet    Assistive device None    Gait Pattern Step-through pattern;Within Functional Limits      Exercises   Exercises Knee/Hip      Knee/Hip Exercises: Stretches   Quad Stretch Right;2 reps;30 seconds    Quad Stretch Limitations prone with strap    Hip Flexor Stretch Right;2 reps;30 seconds    Hip Flexor Stretch Limitations mod thomas      Knee/Hip Exercises: Aerobic   Recumbent Bike partial/full rev 6 min with seat on level 5      Knee/Hip Exercises: Supine   Heel Slides AAROM;Right;15 reps    Heel Slides Limitations 5x10" on table; 10x10" with beach ball   under heel    Other Supine Knee/Hip Exercises knee flexion hang 2 min hold with 2#, slight overpressure      Manual Therapy   Manual Therapy Joint mobilization;Passive ROM    Joint Mobilization R knee flexion AP mobs over proximal tibial grade III/IV with prolonged knee flexion holds    Passive ROM R knee flexion with stretches into end range                    PT Short Term Goals - 01/23/21 0838      PT SHORT TERM GOAL #1   Title Pt will be independent with initial HEP    Time 2    Period Weeks    Status Achieved    Target Date 01/26/21             PT Long Term Goals - 03/09/21 0926      PT LONG TERM GOAL #1   Title Pt will be independent with advanced HEP    Time 8    Period Weeks    Status Achieved      PT LONG TERM GOAL #2   Title Pt will demo R knee ROM to at least 0 - 115 deg with </= 2/10 pain    Time 8    Period Weeks    Status Partially Met   0-95; 1/10 pain     PT LONG TERM  GOAL #3   Title Pt will be able to ambulate at least 100 feet without AD, </=2/10 pain, and with normalized gait pattern    Time 8    Period Weeks    Status Achieved      PT LONG TERM GOAL #4   Title Pt will demo BLE strength >/= 4+/5    Time 8    Period Weeks    Status Achieved                 Plan - 03/09/21 0931    Clinical Impression Statement Pt functionally is doing well, she shows increased ability to ambulate with a more normalized gait pattern with no pain for at least 100 feet. She still is having the most trouble with R knee flexion. Tried tibiofemoral joint mobs with her, PROM with some stretching into end range, and active stretches to assist with flexion but pt knee ROM remains at 95 deg. Consulted with her about mentioning this to doctor and discussing further options going fowards. She responded well to the exercises only reporting mild discomfort in the knee when bending. Pt has met all LTGs except her knee ROM goal of 0-115 deg.    Personal Factors and Comorbidities Past/Current Experience;Comorbidity 2;Time since onset of injury/illness/exacerbation;Finances    Comorbidities HTN, L TKA 2014    PT Frequency 2x / week    PT Duration 3 weeks    PT Treatment/Interventions ADLs/Self Care Home Management;Cryotherapy;Electrical Stimulation;Moist Heat;Gait training;Neuromuscular re-education;Balance training;Therapeutic exercise;Therapeutic activities;Functional mobility training;Stair training;Patient/family education;Manual techniques;Energy conservation;Taping;Vasopneumatic Device    PT Next Visit Plan patellar and tibiofemoral jt mobs; work on knee flexion ROM, quad strengthening, manual and modalities as needed.    PT Home Exercise Plan see pt education    Consulted and Agree with Plan of Care Patient           Patient will benefit from skilled therapeutic intervention in order to improve the following deficits and impairments:  Abnormal gait,Decreased range of  motion,Difficulty walking,Pain,Improper body mechanics,Impaired flexibility,Decreased balance,Decreased mobility,Decreased strength,Increased edema,Increased muscle spasms  Visit Diagnosis: Muscle weakness (generalized)  Acute pain of right knee  Stiffness of right knee, not elsewhere classified  Other abnormalities of gait and mobility     Problem List Patient Active Problem List   Diagnosis Date Noted  . OA (osteoarthritis) of knee 01/09/2021  . Preventive measure 01/25/2016  . Numbness and tingling in hands 01/18/2015  . Former smoker, stopped smoking in distant past 01/18/2015  . Hypertension 12/26/2011  . Postmenopausal status, on HRT 12/26/2011  . Knee pain, s/p TKR LEFT 12/26/2011    Braylin L Clark, PTA 03/09/2021, 9:42 AM  [facility] [place] [address]  [address] [place], [place], [address] Phone: [phone]   Fax:  [phone]  Name: Holly Herring MRN: [id number] Date of Birth: 09/11/1951  PHYSICAL THERAPY DISCHARGE SUMMARY  Visits from Start of Care: 16  Current functional level related to goals / functional outcomes: Unable to assess; patient did not return after f/u with MD   Remaining deficits: Unable to assess   Education / Equipment: HEP  Plan: Patient agrees to discharge.  Patient goals were partially met. Patient is being discharged due to not returning since the last visit.  ?????     Yevgeniya Kovalenko, PT, DPT 04/13/21 2:44 PM  

## 2021-03-15 ENCOUNTER — Encounter (HOSPITAL_COMMUNITY): Payer: Self-pay | Admitting: Orthopedic Surgery

## 2021-03-15 ENCOUNTER — Other Ambulatory Visit: Payer: Self-pay

## 2021-03-15 NOTE — Progress Notes (Addendum)
COVID Vaccine Completed:  x3 Date COVID Vaccine completed:  01-17-20, 02-09-20 Has received booster:08-27-20 COVID vaccine manufacturer: Pfizer     Date of COVID positive in last 90 days:  N/A  PCP - Arva Chafe, DO Cardiologist - N/A  Chest x-ray - N/A EKG - 01-03-21 Epic Stress Test - N/A ECHO - N/A Cardiac Cath -  Pacemaker/ICD device last checked: Spinal Cord Stimulator:  Sleep Study - N/A CPAP -   Fasting Blood Sugar - N/A Checks Blood Sugar _____ times a day  Blood Thinner Instructions: Aspirin Instructions:  ASA 81 mg  Last Dose:  03-15-21.  Not advised to stop by surgeon  Activity level:  Can go up a flight of stairs and perform activities of daily living without stopping and without symptoms of chest pain or shortness of breath  Anesthesia review: N/A  Patient denies shortness of breath, fever, cough and chest pain at PAT appointment (completed over the phone)   Patient verbalized understanding of instructions that were given to them at the PAT appointment. Patient was also instructed that they will need to review over the PAT instructions again at home before surgery.

## 2021-03-20 ENCOUNTER — Ambulatory Visit (HOSPITAL_COMMUNITY)
Admission: RE | Admit: 2021-03-20 | Discharge: 2021-03-20 | Disposition: A | Discharge status: Home / Self Care | Payer: Medicare Other | Attending: Orthopedic Surgery | Admitting: Orthopedic Surgery

## 2021-03-20 ENCOUNTER — Ambulatory Visit (HOSPITAL_COMMUNITY): Payer: Medicare Other | Admitting: Certified Registered Nurse Anesthetist

## 2021-03-20 ENCOUNTER — Encounter (HOSPITAL_COMMUNITY)
Admission: RE | Disposition: A | Discharge status: Home / Self Care | Payer: Self-pay | Source: Home / Self Care | Attending: Orthopedic Surgery

## 2021-03-20 ENCOUNTER — Encounter (HOSPITAL_COMMUNITY): Payer: Self-pay | Admitting: Orthopedic Surgery

## 2021-03-20 DIAGNOSIS — Z20822 Contact with and (suspected) exposure to covid-19: Secondary | ICD-10-CM | POA: Insufficient documentation

## 2021-03-20 DIAGNOSIS — X58XXXA Exposure to other specified factors, initial encounter: Secondary | ICD-10-CM | POA: Insufficient documentation

## 2021-03-20 DIAGNOSIS — Z7982 Long term (current) use of aspirin: Secondary | ICD-10-CM | POA: Insufficient documentation

## 2021-03-20 DIAGNOSIS — I10 Essential (primary) hypertension: Secondary | ICD-10-CM | POA: Insufficient documentation

## 2021-03-20 DIAGNOSIS — Z79899 Other long term (current) drug therapy: Secondary | ICD-10-CM | POA: Diagnosis not present

## 2021-03-20 DIAGNOSIS — T8489XA Other specified complication of internal orthopedic prosthetic devices, implants and grafts, initial encounter: Secondary | ICD-10-CM | POA: Diagnosis not present

## 2021-03-20 DIAGNOSIS — T8482XA Fibrosis due to internal orthopedic prosthetic devices, implants and grafts, initial encounter: Secondary | ICD-10-CM | POA: Diagnosis not present

## 2021-03-20 DIAGNOSIS — Z96651 Presence of right artificial knee joint: Secondary | ICD-10-CM | POA: Diagnosis not present

## 2021-03-20 DIAGNOSIS — R202 Paresthesia of skin: Secondary | ICD-10-CM | POA: Diagnosis not present

## 2021-03-20 DIAGNOSIS — M24661 Ankylosis, right knee: Secondary | ICD-10-CM | POA: Diagnosis not present

## 2021-03-20 HISTORY — PX: KNEE CLOSED REDUCTION: SHX995

## 2021-03-20 LAB — BASIC METABOLIC PANEL
Anion gap: 11 (ref 5–15)
BUN: 12 mg/dL (ref 8–23)
CO2: 22 mmol/L (ref 22–32)
Calcium: 9.2 mg/dL (ref 8.9–10.3)
Chloride: 103 mmol/L (ref 98–111)
Creatinine, Ser: 0.71 mg/dL (ref 0.44–1.00)
GFR, Estimated: >60 mL/min (ref >60)
Glucose, Bld: 89 mg/dL (ref 70–99)
Potassium: 3.8 mmol/L (ref 3.5–5.1)
Sodium: 136 mmol/L (ref 135–145)

## 2021-03-20 LAB — CBC
HCT: 39.6 % (ref 36.0–46.0)
Hemoglobin: 12.3 g/dL (ref 12.0–15.0)
MCH: 27.8 pg (ref 26.0–34.0)
MCHC: 31.1 g/dL (ref 30.0–36.0)
MCV: 89.4 fL (ref 80.0–100.0)
Platelets: 355 10*3/uL (ref 150–400)
RBC: 4.43 MIL/uL (ref 3.87–5.11)
RDW: 14.3 % (ref 11.5–15.5)
WBC: 6 10*3/uL (ref 4.0–10.5)
nRBC: 0 % (ref 0.0–0.2)

## 2021-03-20 LAB — SARS CORONAVIRUS 2 BY RT PCR (HOSPITAL ORDER, PERFORMED IN ~~LOC~~ HOSPITAL LAB): SARS Coronavirus 2: NEGATIVE

## 2021-03-20 SURGERY — MANIPULATION, KNEE, CLOSED
Anesthesia: General | Site: Knee | Laterality: Right

## 2021-03-20 MED ORDER — LIDOCAINE 2% (20 MG/ML) 5 ML SYRINGE
INTRAMUSCULAR | Status: DC | PRN
  Administered 2021-03-20: 100 mg via INTRAVENOUS

## 2021-03-20 MED ORDER — ONDANSETRON HCL 4 MG/2ML IJ SOLN: 4.0000 mg | Freq: Once | INTRAMUSCULAR | Status: DC | PRN

## 2021-03-20 MED ORDER — FENTANYL CITRATE (PF) 100 MCG/2ML IJ SOLN
INTRAMUSCULAR | Status: AC
  Filled 2021-03-20: qty 2

## 2021-03-20 MED ORDER — POVIDONE-IODINE 10 % EX SWAB: 2.0000 "application " | Freq: Once | CUTANEOUS | Status: DC

## 2021-03-20 MED ORDER — PROPOFOL 10 MG/ML IV BOLUS
INTRAVENOUS | Status: AC
  Filled 2021-03-20: qty 20

## 2021-03-20 MED ORDER — SODIUM CHLORIDE 0.9 % IV SOLN: INTRAVENOUS | Status: DC

## 2021-03-20 MED ORDER — CHLORHEXIDINE GLUCONATE 0.12 % MT SOLN
15.0000 mL | Freq: Once | OROMUCOSAL | Status: AC
  Administered 2021-03-20: 15 mL via OROMUCOSAL

## 2021-03-20 MED ORDER — CHLORHEXIDINE GLUCONATE 4 % EX LIQD: 60.0000 mL | Freq: Once | CUTANEOUS | Status: DC

## 2021-03-20 MED ORDER — HYDROMORPHONE HCL 2 MG PO TABS: 1.0000 mg | ORAL_TABLET | Freq: Three times a day (TID) | ORAL | 0 refills | Status: AC | PRN

## 2021-03-20 MED ORDER — FENTANYL CITRATE (PF) 100 MCG/2ML IJ SOLN
INTRAMUSCULAR | Status: DC | PRN
  Administered 2021-03-20: 50 ug via INTRAVENOUS

## 2021-03-20 MED ORDER — MIDAZOLAM HCL 2 MG/2ML IJ SOLN
INTRAMUSCULAR | Status: DC | PRN
  Administered 2021-03-20: 1 mg via INTRAVENOUS

## 2021-03-20 MED ORDER — LACTATED RINGERS IV SOLN: INTRAVENOUS | Status: DC | PRN

## 2021-03-20 MED ORDER — PROPOFOL 10 MG/ML IV BOLUS
INTRAVENOUS | Status: DC | PRN
  Administered 2021-03-20: 200 mg via INTRAVENOUS

## 2021-03-20 MED ORDER — MIDAZOLAM HCL 2 MG/2ML IJ SOLN
INTRAMUSCULAR | Status: AC
  Filled 2021-03-20: qty 2

## 2021-03-20 MED ORDER — FENTANYL CITRATE (PF) 100 MCG/2ML IJ SOLN
25.0000 ug | INTRAMUSCULAR | Status: DC | PRN
  Administered 2021-03-20: 50 ug via INTRAVENOUS

## 2021-03-20 MED ORDER — LACTATED RINGERS IV SOLN: INTRAVENOUS | Status: DC

## 2021-03-20 MED ORDER — ORAL CARE MOUTH RINSE: 15.0000 mL | Freq: Once | OROMUCOSAL | Status: AC

## 2021-03-20 SURGICAL SUPPLY — 18 items
BNDG ADH 1X3 SHEER STRL LF (GAUZE/BANDAGES/DRESSINGS) IMPLANT
BNDG ADH THN 3X1 STRL LF (GAUZE/BANDAGES/DRESSINGS)
COVER SURGICAL LIGHT HANDLE (MISCELLANEOUS) ×2 IMPLANT
COVER WAND RF STERILE (DRAPES) IMPLANT
GAUZE SPONGE 4X4 12PLY STRL (GAUZE/BANDAGES/DRESSINGS) IMPLANT
GLOVE SRG 8 PF TXTR STRL LF DI (GLOVE) ×1 IMPLANT
GLOVE SURG ENC MOIS LTX SZ6.5 (GLOVE) ×4 IMPLANT
GLOVE SURG ENC MOIS LTX SZ8 (GLOVE) ×4 IMPLANT
GLOVE SURG POLYISO LF SZ7 (GLOVE) ×2 IMPLANT
GLOVE SURG UNDER POLY LF SZ7 (GLOVE) ×4 IMPLANT
GLOVE SURG UNDER POLY LF SZ8 (GLOVE) ×2
GOWN STRL REUS W/TWL LRG LVL3 (GOWN DISPOSABLE) ×2 IMPLANT
KIT TURNOVER KIT A (KITS) ×2 IMPLANT
NDL SAFETY ECLIPSE 18X1.5 (NEEDLE) IMPLANT
NEEDLE HYPO 18GX1.5 SHARP (NEEDLE)
PENCIL SMOKE EVACUATOR (MISCELLANEOUS) IMPLANT
SWABSTICK PVP IODINE (MISCELLANEOUS) ×2 IMPLANT
SYR CONTROL 10ML LL (SYRINGE) IMPLANT

## 2021-03-20 NOTE — H&P (Signed)
CC- ALIXANDRA ALFIERI is a 70 y.o. female who presents with right knee stiffness.  HPI- . Knee Pain: Patient presents with stiffness involving the  right knee. Onset of the symptoms was several weeks ago. Inciting event: She had a right total knee arthroplasty on 01/09/21 and has had persistent stiffness despite adequate physical therapy. She is unable to flex beyond 90 degrees and has functional limitations due to that. She presents now for closed manipulation..  Past Medical History:  Diagnosis Date  . Arthritis   . Hypertension   . Uterine prolapse    had a Gelhorn pessary implant    Past Surgical History:  Procedure Laterality Date  . ABDOMINAL HYSTERECTOMY  2019  . BREAST SURGERY Bilateral    breast reduction  . donut     Gelhorn  pessary for uterine prolapse  . HERNIA REPAIR    . KNEE ARTHROSCOPY     Dr Hollice Gong  . OOPHORECTOMY     one ovary removed due to ectopic  . REDUCTION MAMMAPLASTY Bilateral 2002  . 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   Procedure: TOTAL KNEE ARTHROPLASTY;  Surgeon: Nestor Lewandowsky, MD;  Location: [facility] OR;  Service: Orthopedics;  Laterality: Left;  DEPUY/SIGMA  . TOTAL KNEE ARTHROPLASTY Right 01/09/2021   Procedure: TOTAL KNEE ARTHROPLASTY;  Surgeon: Ollen Gross, MD;  Location: [facility];  Service: Orthopedics;  Laterality: Right;     Prior to Admission medications   Medication Sig Start Date End Date Taking? Authorizing Provider  aspirin EC 81 MG tablet Take 81 mg by mouth daily. Swallow whole.   Yes [provider]  calcium carbonate (OS-CAL) 600 MG TABS tablet Take 600 mg by mouth daily with breakfast.   Yes [provider]  diphenhydrAMINE (SLEEP AID, DIPHENHYDRAMINE,) 25 MG tablet Take 50 mg by mouth at bedtime.   Yes [provider]  estradiol (ESTRACE) 0.1 MG/GM vaginal cream Place 1 Applicatorful vaginally See admin instructions. Every 2 days 07/10/20  Yes [provider]  estradiol (ESTRACE) 1 MG tablet Take  1 tablet (1 mg total) by mouth daily. 11/14/20  Yes Willodean Rosenthal, MD  lisinopril-hydrochlorothiazide (ZESTORETIC) 10-12.5 MG tablet Take 1 tablet by mouth once daily Patient taking differently: Take 1 tablet by mouth daily. 11/14/20  Yes Sharlene Dory, DO  Magnesium 500 MG CAPS Take 500 mg by mouth daily.   Yes [provider]  methocarbamol (ROBAXIN) 500 MG tablet Take 1 tablet (500 mg total) by mouth every 6 (six) hours as needed for muscle spasms. Patient taking differently: Take 500 mg by mouth every 8 (eight) hours as needed for muscle spasms. 01/10/21  Yes Edmisten, Kristie L, PA  Omega-3 Fatty Acids (FISH OIL) 500 MG CAPS Take 500 mg by mouth daily.   Yes [provider]  OVER THE COUNTER MEDICATION Take 1 capsule by mouth in the morning and at bedtime. Golo Release   Yes [provider]  POTASSIUM GLUCONATE PO Take 500 mg by mouth daily.   Yes [provider]  vitamin B-12 (CYANOCOBALAMIN) 1000 MCG tablet Take 1,000 mcg by mouth daily.   Yes [provider]  vitamin C (ASCORBIC ACID) 500 MG tablet Take 500 mg by mouth daily.   Yes [provider]  Zinc 50 MG CAPS Take 50 mg by mouth daily.   Yes [provider]  gabapentin (NEURONTIN) 100 MG capsule Take 200 mg three times a day for two weeks following surgery.Then take 200 mg two times a  day for two weeks. Then take 200 mg once a day for two weeks. Then discontinue. Patient not taking: Reported on 03/15/2021 01/10/21   Edmisten, Danford Bad L, PA  HYDROmorphone (DILAUDID) 2 MG tablet Take 1-2 tablets (2-4 mg total) by mouth every 6 (six) hours as needed for moderate pain or severe pain. Not to exceed 6 tablets a day. Patient not taking: Reported on 03/15/2021 01/10/21   Arther Abbott L, PA   Right knee exam antalgic gait, no warmth or effusion, reduced range of motion (5-90), collateral ligaments intact  Physical Examination: General appearance - alert, well  appearing, and in no distress Mental status - alert, oriented to person, place, and time Chest - clear to auscultation, no wheezes, rales or rhonchi, symmetric air entry Heart - normal rate, regular rhythm, normal S1, S2, no murmurs, rubs, clicks or gallops Abdomen - soft, nontender, nondistended, no masses or organomegaly Neurological - alert, oriented, normal speech, no focal findings or movement disorder noted    Asessment/Plan--- Right knee arthrofibrosis- - Plan Right knee closed manipulation. Procedure risks and potential comps discussed with patient who elects to proceed. Goals are decreased pain and increased function with a high likelihood of achieving both

## 2021-03-20 NOTE — Anesthesia Preprocedure Evaluation (Signed)
Anesthesia Evaluation  Patient identified by MRN, date of birth, ID band Patient awake    Reviewed: Allergy & Precautions, NPO status , Patient's Chart, lab work & pertinent test results  Airway Mallampati: II  TM Distance: >3 FB Neck ROM: Full    Dental no notable dental hx.    Pulmonary neg pulmonary ROS, former smoker,    Pulmonary exam normal breath sounds clear to auscultation       Cardiovascular hypertension, Normal cardiovascular exam Rhythm:Regular Rate:Normal     Neuro/Psych negative neurological ROS  negative psych ROS   GI/Hepatic negative GI ROS, Neg liver ROS,   Endo/Other  negative endocrine ROS  Renal/GU negative Renal ROS  negative genitourinary   Musculoskeletal negative musculoskeletal ROS (+)   Abdominal   Peds negative pediatric ROS (+)  Hematology negative hematology ROS (+)   Anesthesia Other Findings   Reproductive/Obstetrics negative OB ROS                             Anesthesia Physical Anesthesia Plan  ASA: II  Anesthesia Plan: General   Post-op Pain Management:    Induction: Intravenous  PONV Risk Score and Plan: 3 and Ondansetron and Treatment may vary due to age or medical condition  Airway Management Planned: Mask  Additional Equipment:   Intra-op Plan:   Post-operative Plan: Extubation in OR  Informed Consent: I have reviewed the patients History and Physical, chart, labs and discussed the procedure including the risks, benefits and alternatives for the proposed anesthesia with the patient or authorized representative who has indicated his/her understanding and acceptance.     Dental advisory given  Plan Discussed with: CRNA and Surgeon  Anesthesia Plan Comments:         Anesthesia Quick Evaluation

## 2021-03-20 NOTE — Discharge Instructions (Signed)
 Frank Aluisio, MD Total Joint Specialist [facility] [address] [place], [place] [address] [phone]  TOTAL KNEE REPLACEMENT POSTOPERATIVE DIRECTIONS    Knee Rehabilitation, Guidelines Following Surgery  Results after knee surgery are often greatly improved when you follow the exercise, range of motion and muscle strengthening exercises prescribed by your doctor. Safety measures are also important to protect the knee from further injury. If any of these exercises cause you to have increased pain or swelling in your knee joint, decrease the amount until you are comfortable again and slowly increase them. If you have problems or questions, call your caregiver or physical therapist for advice.   HOME CARE INSTRUCTIONS  . Remove items at home which could result in a fall. This includes throw rugs or furniture in walking pathways.  . ICE to the affected knee as much as tolerated. Icing helps control swelling. If the swelling is well controlled you will be more comfortable and rehab easier. Continue to use ice on the knee for pain and swelling from surgery. You may notice swelling that will progress down to the foot and ankle. This is normal after surgery. Elevate the leg when you are not up walking on it.    . Continue to use the breathing machine which will help keep your temperature down. It is common for your temperature to cycle up and down following surgery, especially at night when you are not up moving around and exerting yourself. The breathing machine keeps your lungs expanded and your temperature down. . Do not place pillow under the operative knee, focus on keeping the knee straight while resting  DIET You may resume your previous home diet once you are discharged from the hospital.  DRESSING / WOUND CARE / SHOWERING . Keep your bulky bandage on for 2 days. On the third post-operative day you may remove the Ace bandage and gauze. There is a  waterproof adhesive bandage on your skin which will stay in place until your first follow-up appointment. Once you remove this you will not need to place another bandage . You may begin showering 3 days following surgery, but do not submerge the incision under water.  ACTIVITY For the first 5 days, the key is rest and control of pain and swelling . Do your home exercises twice a day starting on post-operative day 3. On the days you go to physical therapy, just do the home exercises once that day. . You should rest, ice and elevate the leg for 50 minutes out of every hour. Get up and walk/stretch for 10 minutes per hour. After 5 days you can increase your activity slowly as tolerated. . Walk with your walker as instructed. Use the walker until you are comfortable transitioning to a cane. Walk with the cane in the opposite hand of the operative leg. You may discontinue the cane once you are comfortable and walking steadily. . Avoid periods of inactivity such as sitting longer than an hour when not asleep. This helps prevent blood clots.  . You may discontinue the knee immobilizer once you are able to perform a straight leg raise while lying down. . You may resume a sexual relationship in one month or when given the OK by your doctor.  . You may return to work once you are cleared by your doctor.  . Do not drive a car for 6 weeks or until released by your surgeon.  . Do not drive while taking narcotics.  TED HOSE   STOCKINGS Wear the elastic stockings on both legs for three weeks following surgery during the day. You may remove them at night for sleeping.  WEIGHT BEARING Weight bearing as tolerated with assist device (walker, cane, etc) as directed, use it as long as suggested by your surgeon or therapist, typically at least 4-6 weeks.  POSTOPERATIVE CONSTIPATION PROTOCOL Constipation - defined medically as fewer than three stools per week and severe constipation as less than one stool per  week.  One of the most common issues patients have following surgery is constipation.  Even if you have a regular bowel pattern at home, your normal regimen is likely to be disrupted due to multiple reasons following surgery.  Combination of anesthesia, postoperative narcotics, change in appetite and fluid intake all can affect your bowels.  In order to avoid complications following surgery, here are some recommendations in order to help you during your recovery period.  . Colace (docusate) - Pick up an over-the-counter form of Colace or another stool softener and take twice a day as long as you are requiring postoperative pain medications.  Take with a full glass of water daily.  If you experience loose stools or diarrhea, hold the colace until you stool forms back up. If your symptoms do not get better within 1 week or if they get worse, check with your doctor. . Dulcolax (bisacodyl) - Pick up over-the-counter and take as directed by the product packaging as needed to assist with the movement of your bowels.  Take with a full glass of water.  Use this product as needed if not relieved by Colace only.  . MiraLax (polyethylene glycol) - Pick up over-the-counter to have on hand. MiraLax is a solution that will increase the amount of water in your bowels to assist with bowel movements.  Take as directed and can mix with a glass of water, juice, soda, coffee, or tea. Take if you go more than two days without a movement. Do not use MiraLax more than once per day. Call your doctor if you are still constipated or irregular after using this medication for 7 days in a row.  If you continue to have problems with postoperative constipation, please contact the office for further assistance and recommendations.  If you experience "the worst abdominal pain ever" or develop nausea or vomiting, please contact the office immediatly for further recommendations for treatment.  ITCHING If you experience itching with your  medications, try taking only a single pain pill, or even half a pain pill at a time.  You can also use Benadryl over the counter for itching or also to help with sleep.   MEDICATIONS See your medication summary on the "After Visit Summary" that the nursing staff will review with you prior to discharge.  You may have some home medications which will be placed on hold until you complete the course of blood thinner medication.  It is important for you to complete the blood thinner medication as prescribed by your surgeon.  Continue your approved medications as instructed at time of discharge.  PRECAUTIONS . If you experience chest pain or shortness of breath - call 911 immediately for transfer to the hospital emergency department.  . If you develop a fever greater that 101 F, purulent drainage from wound, increased redness or drainage from wound, foul odor from the wound/dressing, or calf pain - CONTACT YOUR SURGEON.                                                     FOLLOW-UP APPOINTMENTS Make sure you keep all of your appointments after your operation with your surgeon and caregivers. You should call the office at the above phone number and make an appointment for approximately two weeks after the date of your surgery or on the date instructed by your surgeon outlined in the "After Visit Summary".  RANGE OF MOTION AND STRENGTHENING EXERCISES  Rehabilitation of the knee is important following a knee injury or an operation. After just a few days of immobilization, the muscles of the thigh which control the knee become weakened and shrink (atrophy). Knee exercises are designed to build up the tone and strength of the thigh muscles and to improve knee motion. Often times heat used for twenty to thirty minutes before working out will loosen up your tissues and help with improving the range of motion but do not use heat for the first two weeks following surgery. These exercises can be done on a training  (exercise) mat, on the floor, on a table or on a bed. Use what ever works the best and is most comfortable for you Knee exercises include:  . Leg Lifts - While your knee is still immobilized in a splint or cast, you can do straight leg raises. Lift the leg to 60 degrees, hold for 3 sec, and slowly lower the leg. Repeat 10-20 times 2-3 times daily. Perform this exercise against resistance later as your knee gets better.  Javier Docker and Hamstring Sets - Tighten up the muscle on the front of the thigh (Quad) and hold for 5-10 sec. Repeat this 10-20 times hourly. Hamstring sets are done by pushing the foot backward against an object and holding for 5-10 sec. Repeat as with quad sets.   Leg Slides: Lying on your back, slowly slide your foot toward your buttocks, bending your knee up off the floor (only go as far as is comfortable). Then slowly slide your foot back down until your leg is flat on the floor again.  Angel Wings: Lying on your back spread your legs to the side as far apart as you can without causing discomfort.  A rehabilitation program following serious knee injuries can speed recovery and prevent re-injury in the future due to weakened muscles. Contact your doctor or a physical therapist for more information on knee rehabilitation.   POST-OPERATIVE OPIOID TAPER INSTRUCTIONS: . It is important to wean off of your opioid medication as soon as possible. If you do not need pain medication after your surgery it is ok to stop day one. Marland Kitchen Opioids include: o Codeine, Hydrocodone(Norco, Vicodin), Oxycodone(Percocet, oxycontin) and hydromorphone amongst others.  . Long term and even short term use of opiods can cause: o Increased pain response o Dependence o Constipation o Depression o Respiratory depression o And more.  . Withdrawal symptoms can include o Flu like symptoms o Nausea, vomiting o And more . Techniques to manage these symptoms o Hydrate well o Eat regular healthy meals o Stay  active o Use relaxation techniques(deep breathing, meditating, yoga) . Do Not substitute Alcohol to help with tapering . If you have been on opioids for less than two weeks and do not have pain than it is ok to stop all together.  . Plan to wean off of opioids o This plan should start within one week post op of your joint replacement. o Maintain the same interval or time between taking each dose and first decrease the dose.  o Cut the total daily intake of opioids by one  tablet each day o Next start to increase the time between doses. o The last dose that should be eliminated is the evening dose.     IF YOU ARE TRANSFERRED TO A SKILLED REHAB FACILITY If the patient is transferred to a skilled rehab facility following release from the hospital, a list of the current medications will be sent to the facility for the patient to continue.  When discharged from the skilled rehab facility, please have the facility set up the patient's Home Health Physical Therapy prior to being released. Also, the skilled facility will be responsible for providing the patient with their medications at time of release from the facility to include their pain medication, the muscle relaxants, and their blood thinner medication. If the patient is still at the rehab facility at time of the two week follow up appointment, the skilled rehab facility will also need to assist the patient in arranging follow up appointment in our office and any transportation needs.  MAKE SURE YOU:  . Understand these instructions.  . Get help right away if you are not doing well or get worse.   DENTAL ANTIBIOTICS:  In most cases prophylactic antibiotics for Dental procdeures after total joint surgery are not necessary.  Exceptions are as follows:  1. History of prior total joint infection  2. Severely immunocompromised (Organ Transplant, cancer chemotherapy, Rheumatoid biologic meds such as Humera)  3. Poorly controlled diabetes (A1C  &gt; 8.0, blood glucose over 200)  If you have one of these conditions, contact your surgeon for an antibiotic prescription, prior to your dental procedure.    Pick up stool softner and laxative for home use following surgery while on pain medications. Do not submerge incision under water. Please use good hand washing techniques while changing dressing each day. May shower starting three days after surgery. Please use a clean towel to pat the incision dry following showers. Continue to use ice for pain and swelling after surgery. Do not use any lotions or creams on the incision until instructed by your surgeon.

## 2021-03-20 NOTE — Transfer of Care (Signed)
Immediate Anesthesia Transfer of Care Note  Patient: Holly Herring  Procedure(s) Performed: CLOSED MANIPULATION KNEE (Right Knee)  Patient Location: PACU  Anesthesia Type:General  Level of Consciousness: awake and alert   Airway & Oxygen Therapy: Patient Spontanous Breathing and Patient connected to face mask oxygen  Post-op Assessment: Report given to RN and Post -op Vital signs reviewed and stable  Post vital signs: Reviewed and stable  Last Vitals:  Vitals Value Taken Time  BP 139/64 03/20/21 1610  Temp 36.6 C 03/20/21 1610  Pulse 91 03/20/21 1612  Resp 15 03/20/21 1612  SpO2 100 % 03/20/21 1612  Vitals shown include unvalidated device data.  Last Pain:  Vitals:   03/20/21 1207  TempSrc:   PainSc: 0-No pain      Patients Stated Pain Goal: 4 (03/20/21 1207)  Complications: No complications documented.

## 2021-03-20 NOTE — Anesthesia Procedure Notes (Signed)
Procedure Name: General with mask airway Date/Time: 03/20/2021 3:50 PM Performed by: Minerva Ends, CRNA Pre-anesthesia Checklist: Patient identified, Emergency Drugs available, Suction available, Patient being monitored and Timeout performed Patient Re-evaluated:Patient Re-evaluated prior to induction Oxygen Delivery Method: Circle system utilized Preoxygenation: Pre-oxygenation with 100% oxygen Placement Confirmation: positive ETCO2 and breath sounds checked- equal and bilateral Dental Injury: Teeth and Oropharynx as per pre-operative assessment

## 2021-03-20 NOTE — Op Note (Signed)
  OPERATIVE REPORT   PREOPERATIVE DIAGNOSIS: Arthrofibrosis, Right  knee.   POSTOPERATIVE DIAGNOSIS: Arthrofibrosis, Right knee.   PROCEDURE:  Right  knee closed manipulation.   SURGEON: Chanteria Haggard, MD   ASSISTANT: None.   ANESTHESIA: General.   COMPLICATIONS: None.   CONDITION: Stable to Recovery.   Pre-manipulation range of motion is 5-90.  Post-manipulation range of  Motion is 0-130  PROCEDURE IN DETAIL: After successful administration of general  anesthetic, exam under anesthesia was performed showing range of motion  5-90 degrees. I then placed my chest against the proximal tibia,  flexing the knee with audible lysis of adhesions. I was easily able to  get the knee flexed to 130  degrees. I then put the knee back in extension and with some  patellar manipulation and gentle pressure got to full  Extension.The patient was subsequently awakened and transported to Recovery in  stable condition.    

## 2021-03-20 NOTE — Anesthesia Postprocedure Evaluation (Signed)
Anesthesia Post Note  Patient: Holly Herring  Procedure(s) Performed: CLOSED MANIPULATION KNEE (Right Knee)     Patient location during evaluation: PACU Anesthesia Type: General Level of consciousness: awake and alert Pain management: pain level controlled Vital Signs Assessment: post-procedure vital signs reviewed and stable Respiratory status: spontaneous breathing, nonlabored ventilation, respiratory function stable and patient connected to nasal cannula oxygen Cardiovascular status: blood pressure returned to baseline and stable Postop Assessment: no apparent nausea or vomiting Anesthetic complications: no   No complications documented.  Last Vitals:  Vitals:   03/20/21 1615 03/20/21 1630  BP: 140/69 (!) 142/71  Pulse: 90 90  Resp: 16 12  Temp:    SpO2: 100% 100%    Last Pain:  Vitals:   03/20/21 1630  TempSrc:   PainSc: 2                  Jermale Crass S

## 2021-03-21 ENCOUNTER — Ambulatory Visit (INDEPENDENT_AMBULATORY_CARE_PROVIDER_SITE_OTHER): Payer: Medicare Other | Admitting: Family Medicine

## 2021-03-21 ENCOUNTER — Other Ambulatory Visit: Payer: Self-pay

## 2021-03-21 ENCOUNTER — Encounter (HOSPITAL_COMMUNITY): Payer: Self-pay | Admitting: Orthopedic Surgery

## 2021-03-21 VITALS — BP 138/74 | HR 96 | Temp 98.2°F | Ht 61.0 in | Wt 182.5 lb

## 2021-03-21 DIAGNOSIS — I10 Essential (primary) hypertension: Secondary | ICD-10-CM | POA: Diagnosis not present

## 2021-03-21 DIAGNOSIS — M25661 Stiffness of right knee, not elsewhere classified: Secondary | ICD-10-CM | POA: Diagnosis not present

## 2021-03-21 MED ORDER — LISINOPRIL-HYDROCHLOROTHIAZIDE 10-12.5 MG PO TABS: 1.0000 | ORAL_TABLET | Freq: Every day | ORAL | 2 refills | Status: DC

## 2021-03-21 NOTE — Progress Notes (Signed)
Chief Complaint  Patient presents with  . Follow-up    Subjective Holly Herring is a 70 y.o. female who presents for hypertension follow up. She does not monitor home blood pressures. She is compliant with medications. Patient has these side effects of medication: none She is adhering to a healthy diet overall. Current exercise: working w PT after TKA yesterday.  No CP or SOB.    Past Medical History:  Diagnosis Date  . Arthritis   . Hypertension   . Uterine prolapse    had a Gelhorn pessary implant    Exam BP (!) 158/80 (BP Location: Left Arm, Patient Position: Sitting, Cuff Size: Normal)   Pulse 96   Temp 98.2 F (36.8 C) (Oral)   Ht 5\' 1"  (1.549 m)   Wt 182 lb 8 oz (82.8 kg)   SpO2 94%   BMI 34.48 kg/m  General:  well developed, well nourished, in no apparent distress Heart: RRR, no bruits, no LE edema Lungs: clear to auscultation, no accessory muscle use Psych: well oriented with normal range of affect and appropriate judgment/insight  Primary hypertension  Stable, controlled. Cont Zestoretic 10-12.5 mg/d. Counseled on diet and exercise. F/u in 6 mo for CPE or prn. The patient voiced understanding and agreement to the plan.  Lenox Dale, DO 03/21/21  8:40 AM

## 2021-03-21 NOTE — Patient Instructions (Addendum)
Keep the diet clean and stay active.  Because your blood pressure is well-controlled, you no longer have to check your blood pressure at home anymore unless you wish. Some people check it twice daily every day and some people stop altogether. Either or anything in between is fine. Strong work!  Let us know if you need anything. 

## 2021-03-24 DIAGNOSIS — M25661 Stiffness of right knee, not elsewhere classified: Secondary | ICD-10-CM | POA: Diagnosis not present

## 2021-03-29 DIAGNOSIS — M25661 Stiffness of right knee, not elsewhere classified: Secondary | ICD-10-CM | POA: Diagnosis not present

## 2021-03-31 DIAGNOSIS — M25661 Stiffness of right knee, not elsewhere classified: Secondary | ICD-10-CM | POA: Diagnosis not present

## 2021-04-04 DIAGNOSIS — M25661 Stiffness of right knee, not elsewhere classified: Secondary | ICD-10-CM | POA: Diagnosis not present

## 2021-04-07 DIAGNOSIS — M25661 Stiffness of right knee, not elsewhere classified: Secondary | ICD-10-CM | POA: Diagnosis not present

## 2021-05-01 ENCOUNTER — Encounter: Payer: Self-pay | Admitting: Obstetrics & Gynecology

## 2021-05-01 ENCOUNTER — Other Ambulatory Visit: Payer: Self-pay

## 2021-05-01 ENCOUNTER — Ambulatory Visit: Payer: Medicare Other | Admitting: Obstetrics & Gynecology

## 2021-05-01 VITALS — BP 145/66 | HR 87 | Temp 98.1°F | Ht 61.0 in | Wt 185.0 lb

## 2021-05-01 DIAGNOSIS — N814 Uterovaginal prolapse, unspecified: Secondary | ICD-10-CM

## 2021-05-01 NOTE — Progress Notes (Signed)
HPI Pt presents today to have pessary removed and cleaned. She denies problems and reports that her pessary is working very well. She denies further complaints. She recently has a knee replacement and then a f/u procedure for scar tissue post surgery. She is now gardening and riding her stationary bike.     Review of Systems: non contributory.   Objective:  Physical Exam: BP (!) 145/66 (BP Location: Left Arm, Patient Position: Sitting, Cuff Size: Normal)   Pulse 87   Temp 98.1 F (36.7 C) (Oral)   Ht 5\' 1"  (1.549 m)   Wt 185 lb (83.9 kg)   BMI 34.96 kg/m   CONSTITUTIONAL: Well-developed, well-nourished female in no acute distress.  HENT: Normocephalic, atraumatic EYES: Conjunctivae and EOM are normal. No scleral icterus.  NECK: Normal range of motion SKIN: Skin is warm and dry. No rash noted. Not diaphoretic.No pallor. NEUROLGIC: Alert and oriented to person, place, and time. Normal coordination.  GYN: Pessary removed and cleaned; vaginal mucosa looks healty and with no excoriations or breakdown   Assessment:   Pessary check- doing well.   Plan:   F/u in 3 months to have pessary removed and cleaned F/u sooner prn  Zailynn Brandel L. Harraway-Smith, M.D., 

## 2021-05-08 NOTE — Progress Notes (Signed)
Subjective:   Holly Herring is a 70 y.o. female who presents for Medicare Annual (Subsequent) preventive examination.  Review of Systems     Cardiac Risk Factors include: advanced age (>59men, >27 women);hypertension;obesity (BMI >30kg/m2)     Objective:    Today's Vitals   05/09/21 0814  BP: 128/88  Pulse: 84  Resp: 16  Temp: 97.8 F (36.6 C)  TempSrc: Temporal  SpO2: 99%  Weight: 184 lb 9.6 oz (83.7 kg)  Height: 5\' 1"  (1.549 m)   Body mass index is 34.88 kg/m.  Advanced Directives 05/09/2021 03/15/2021 01/09/2021 01/03/2021 03/03/2020 01/25/2016 04/13/2013  Does Patient Have a Medical Advance Directive? No Yes No No Yes No Patient does not have advance directive;Patient would not like information  Type of Advance Directive - Healthcare Power of [place];Living will - - Healthcare Power of [place];Living will - -  Does patient want to make changes to medical advance directive? - - - - No - Patient declined - -  Copy of Healthcare Power of Attorney in Chart? - Yes - validated most recent copy scanned in chart (See row information) - - No - copy requested - -  Would patient like information on creating a medical advance directive? No - Patient declined - No - Patient declined No - Patient declined - No - patient declined information -  Pre-existing out of facility DNR order (yellow form or pink MOST form) - - - - - - No    Current Medications (verified) Outpatient Encounter Medications as of 05/09/2021  Medication Sig   aspirin EC 81 MG tablet Take 81 mg by mouth daily. Swallow whole.   calcium carbonate (OS-CAL) 600 MG TABS tablet Take 600 mg by mouth daily with breakfast.   diphenhydrAMINE (SLEEP AID, DIPHENHYDRAMINE,) 25 MG tablet Take 50 mg by mouth at bedtime.   estradiol (ESTRACE) 0.1 MG/GM vaginal cream Place 1 Applicatorful vaginally See admin instructions. Every 2 days   estradiol (ESTRACE) 1 MG tablet Take 1 tablet (1 mg total) by mouth daily.    lisinopril-hydrochlorothiazide (ZESTORETIC) 10-12.5 MG tablet Take 1 tablet by mouth daily.   Magnesium 500 MG CAPS Take 500 mg by mouth daily.   methocarbamol (ROBAXIN) 500 MG tablet Take 1 tablet (500 mg total) by mouth every 6 (six) hours as needed for muscle spasms. (Patient taking differently: Take 500 mg by mouth every 8 (eight) hours as needed for muscle spasms.)   Omega-3 Fatty Acids (FISH OIL) 500 MG CAPS Take 500 mg by mouth daily.   OVER THE COUNTER MEDICATION Take 1 capsule by mouth in the morning and at bedtime. Golo Release   POTASSIUM GLUCONATE PO Take 500 mg by mouth daily.   vitamin B-12 (CYANOCOBALAMIN) 1000 MCG tablet Take 1,000 mcg by mouth daily.   vitamin C (ASCORBIC ACID) 500 MG tablet Take 500 mg by mouth daily.   Zinc 50 MG CAPS Take 50 mg by mouth daily.   No facility-administered encounter medications on file as of 05/09/2021.    Allergies (verified) Codeine, Other, Penicillins, and Percocet [oxycodone-acetaminophen]   History: Past Medical History:  Diagnosis Date   Arthritis    Hypertension    Uterine prolapse    had a Gelhorn pessary implant   Past Surgical History:  Procedure Laterality Date   ABDOMINAL HYSTERECTOMY  2019   BREAST SURGERY Bilateral    breast reduction   donut     Gelhorn  pessary for uterine prolapse   HERNIA REPAIR  KNEE ARTHROSCOPY     Dr Hollice Gong   KNEE CLOSED REDUCTION Right 03/20/2021   Procedure: CLOSED MANIPULATION KNEE;  Surgeon: Ollen Gross, MD;  Location: [facility];  Service: Orthopedics;  Laterality: Right;    OOPHORECTOMY     one ovary removed due to ectopic   REDUCTION MAMMAPLASTY Bilateral 2002   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   Procedure: TOTAL KNEE ARTHROPLASTY;  Surgeon: Nestor Lewandowsky, MD;  Location: [facility];  Service: Orthopedics;  Laterality: Left;  DEPUY/SIGMA   TOTAL KNEE ARTHROPLASTY Right 01/09/2021   Procedure: TOTAL KNEE ARTHROPLASTY;  Surgeon: Ollen Gross, MD;  Location: [facility];  Service:  Orthopedics;  Laterality: Right;    Family History  Problem Relation Age of Onset   Heart disease Mother    Heart disease Brother    Social History   Socioeconomic History   Marital status: Married    Spouse name: Not on file   Number of children: Not on file   Years of education: Not on file   Highest education level: Not on file  Occupational History   Not on file  Tobacco Use   Smoking status: Former    Pack years: 0.00    Types: Cigarettes    Quit date: 11/26/1978    Years since quitting: 42.4   Smokeless tobacco: Never  Vaping Use   Vaping Use: Never used  Substance and Sexual Activity   Alcohol use: Yes    Alcohol/week: 1.0 standard drink    Types: 1 Glasses of wine per week    Comment: occasionally   Drug use: No   Sexual activity: Not on file  Other Topics Concern   Not on file  Social History Narrative   Not on file   Social Determinants of Health   Financial Resource Strain: Low Risk    Difficulty of Paying Living Expenses: Not hard at all  Food Insecurity: No Food Insecurity   Worried About Programme researcher, broadcasting/film/video in the Last Year: Never true   Ran Out of Food in the Last Year: Never true  Transportation Needs: No Transportation Needs   Lack of Transportation (Medical): No   Lack of Transportation (Non-Medical): No  Physical Activity: Insufficiently Active   Days of Exercise per Week: 7 days   Minutes of Exercise per Session: 10 min  Stress: No Stress Concern Present   Feeling of Stress : Not at all  Social Connections: Moderately Integrated   Frequency of Communication with Friends and Family: More than three times a week   Frequency of Social Gatherings with Friends and Family: More than three times a week   Attends Religious Services: More than 4 times per year   Active Member of [employer] or Organizations: No   Attends Engineer, structural: Never   Marital Status: Married    Tobacco Counseling Counseling given: Not  Answered   Clinical Intake:  Pre-visit preparation completed: Yes  Pain : No/denies pain     Nutritional Status: BMI > 30  Obese Nutritional Risks: None Diabetes: No  How often do you need to have someone help you when you read instructions, pamphlets, or other written materials from your doctor or pharmacy?: 1 - Never  Diabetic?No  Interpreter Needed?: No  Information entered by :: Thomasenia Sales LPN   Activities of Daily Living In your present state of health, do you have any difficulty performing the following activities: 05/09/2021 03/15/2021  Hearing? N -  Vision? N -  Difficulty concentrating or making decisions? N -  Walking or climbing stairs? N -  Dressing or bathing? N -  Doing errands, shopping? N N  Preparing Food and eating ? N -  Using the Toilet? N -  In the past six months, have you accidently leaked urine? N -  Do you have problems with loss of bowel control? N -  Managing your Medications? N -  Managing your Finances? N -  Housekeeping or managing your Housekeeping? N -  Some recent data might be hidden    Patient Care Team: Sharlene DoryWendling, Nicholas Paul, DO as PCP - General (Family Medicine)  Indicate any recent Medical Services you may have received from other than [facility] providers in the past year (date may be approximate).     Assessment:   This is a routine wellness examination for [place].  Hearing/Vision screen Hearing Screening - Comments:: No issues Vision Screening - Comments:: Reading glasses Last eye exam-2021  Dietary issues and exercise activities discussed: Current Exercise Habits: Home exercise routine, Type of exercise: Other - see comments (exercise bike), Time (Minutes): 10, Frequency (Times/Week): 4, Weekly Exercise (Minutes/Week): 40, Intensity: Mild, Exercise limited by: None identified   Goals Addressed             This Visit's Progress    Maintain health and independence   On track    Patient Stated       Continue to  lose weight        Depression Screen PHQ 2/9 Scores 05/09/2021 03/21/2021 01/25/2016 01/17/2015  PHQ - 2 Score 0 0 0 0    Fall Risk Fall Risk  05/09/2021 03/03/2020 01/17/2015  Falls in the past year? 0 0 No  Number falls in past yr: 0 0 -  Injury with Fall? 0 0 -  Follow up Falls prevention discussed Education provided;Falls prevention discussed -    FALL RISK PREVENTION PERTAINING TO THE HOME:  Any stairs in or around the home? No  Home free of loose throw rugs in walkways, pet beds, electrical cords, etc? Yes  Adequate lighting in your home to reduce risk of falls? Yes   ASSISTIVE DEVICES UTILIZED TO PREVENT FALLS:  Life alert? No  Use of a cane, walker or w/c? No  Grab bars in the bathroom? Yes  Shower chair or bench in shower? No  Elevated toilet seat or a handicapped toilet? No   TIMED UP AND GO:  Was the test performed? Yes .  Length of time to ambulate 10 feet: 10 sec.   Gait steady and fast without use of assistive device  Cognitive Function:Normal cognitive status assessed by direct observation by this Nurse Health Advisor. No abnormalities found.          Immunizations Immunization History  Administered Date(s) Administered   PFIZER(Purple Top)SARS-COV-2 Vaccination 01/17/2020, 02/09/2020, 08/27/2020, 04/12/2021   PPD Test 12/12/2012   Pneumococcal Polysaccharide-23 09/22/2019    TDAP status: Due, Education has been provided regarding the importance of this vaccine. Advised may receive this vaccine at local pharmacy or Health Dept. Aware to provide a copy of the vaccination record if obtained from local pharmacy or Health Dept. Verbalized acceptance and understanding.  Flu Vaccine status: Declined, Education has been provided regarding the importance of this vaccine but patient still declined. Advised may receive this vaccine at local pharmacy or Health Dept. Aware to provide a copy of the vaccination record if obtained from local pharmacy or Health Dept.  Verbalized acceptance and understanding.  Pneumococcal  vaccine status: Due, Education has been provided regarding the importance of this vaccine. Advised may receive this vaccine at local pharmacy or Health Dept. Aware to provide a copy of the vaccination record if obtained from local pharmacy or Health Dept. Verbalized acceptance and understanding.  Covid-19 vaccine status: Completed vaccines  Qualifies for Shingles Vaccine? Yes   Zostavax completed No   Shingrix Completed?: Yes  Screening Tests Health Maintenance  Topic Date Due   Zoster Vaccines- Shingrix (1 of 2) Never done   MAMMOGRAM  09/21/2020   COLONOSCOPY (Pts 45-28yrs Insurance coverage will need to be confirmed)  09/21/2028   DEXA SCAN  Completed   COVID-19 Vaccine  Completed   Hepatitis C Screening  Completed   HPV VACCINES  Aged Out   INFLUENZA VACCINE  Discontinued   TETANUS/TDAP  Discontinued   PNA vac Low Risk Adult  Discontinued    Health Maintenance  Health Maintenance Due  Topic Date Due   Zoster Vaccines- Shingrix (1 of 2) Never done   MAMMOGRAM  09/21/2020    Colorectal cancer screening: Type of screening: Colonoscopy. Completed 09/21/2018. Repeat every 10 years  Mammogram status: Ordered today. Pt provided with contact info and advised to call to schedule appt.   Bone Density status: Completed 09/29/2019. Results reflect: Bone density results: NORMAL. Repeat every 2 years.  Lung Cancer Screening: (Low Dose CT Chest recommended if Age 42-80 years, 30 pack-year currently smoking OR have quit w/in 15years.) does not qualify.     Additional Screening:  Hepatitis C Screening: Completed 01/25/2016  Vision Screening: Recommended annual ophthalmology exams for early detection of glaucoma and other disorders of the eye. Is the patient up to date with their annual eye exam?  No  Who is the provider or what is the name of the office in which the patient attends annual eye exams? [facility] Patient  advised to make an appt  Dental Screening: Recommended annual dental exams for proper oral hygiene  Community Resource Referral / Chronic Care Management: CRR required this visit?  No   CCM required this visit?  No      Plan:     I have personally reviewed and noted the following in the patient's chart:   Medical and social history Use of alcohol, tobacco or illicit drugs  Current medications and supplements including opioid prescriptions.  Functional ability and status Nutritional status Physical activity Advanced directives List of other physicians Hospitalizations, surgeries, and ER visits in previous 12 months Vitals Screenings to include cognitive, depression, and falls Referrals and appointments  In addition, I have reviewed and discussed with patient certain preventive protocols, quality metrics, and best practice recommendations. A written personalized care plan for preventive services as well as general preventive health recommendations were provided to patient.     Roanna Raider, LPN   2/44/0102  Nurse Health Advisor  Nurse Notes: None

## 2021-05-09 ENCOUNTER — Ambulatory Visit (INDEPENDENT_AMBULATORY_CARE_PROVIDER_SITE_OTHER): Payer: Medicare Other

## 2021-05-09 ENCOUNTER — Other Ambulatory Visit: Payer: Self-pay

## 2021-05-09 VITALS — BP 128/88 | HR 84 | Temp 97.8°F | Resp 16 | Ht 61.0 in | Wt 184.6 lb

## 2021-05-09 DIAGNOSIS — Z1231 Encounter for screening mammogram for malignant neoplasm of breast: Secondary | ICD-10-CM

## 2021-05-09 DIAGNOSIS — Z0000 Encounter for general adult medical examination without abnormal findings: Secondary | ICD-10-CM

## 2021-05-09 NOTE — Patient Instructions (Signed)
Holly Herring , Thank you for taking time to come for your Medicare Wellness Visit. I appreciate your ongoing commitment to your health goals. Please review the following plan we discussed and let me know if I can assist you in the future.   Screening recommendations/referrals: Colonoscopy: Completed 09/21/2018-Due 09/21/2028 Mammogram: Ordered today. Someone will call you to schedule. Bone Density: Completed 09/29/2019-Due 09/28/2021 Recommended yearly ophthalmology/optometry visit for glaucoma screening and checkup Recommended yearly dental visit for hygiene and checkup  Vaccinations: Influenza vaccine: Declined Pneumococcal vaccine: Up to date Tdap vaccine: Discuss with pharmacy Shingles vaccine: Discuss with pharmacy   Covid-19:Up to date  Advanced directives: Declined information today  Conditions/risks identified: See problem list  Next appointment: Follow up in one year for your annual wellness visit    Preventive Care 65 Years and Older, Female Preventive care refers to lifestyle choices and visits with your health care provider that can promote health and wellness. What does preventive care include? A yearly physical exam. This is also called an annual well check. Dental exams once or twice a year. Routine eye exams. Ask your health care provider how often you should have your eyes checked. Personal lifestyle choices, including: Daily care of your teeth and gums. Regular physical activity. Eating a healthy diet. Avoiding tobacco and drug use. Limiting alcohol use. Practicing safe sex. Taking low-dose aspirin every day. Taking vitamin and mineral supplements as recommended by your health care provider. What happens during an annual well check? The services and screenings done by your health care provider during your annual well check will depend on your age, overall health, lifestyle risk factors, and family history of disease. Counseling  Your health care provider may  ask you questions about your: Alcohol use. Tobacco use. Drug use. Emotional well-being. Home and relationship well-being. Sexual activity. Eating habits. History of falls. Memory and ability to understand (cognition). Work and work Astronomer. Reproductive health. Screening  You may have the following tests or measurements: Height, weight, and BMI. Blood pressure. Lipid and cholesterol levels. These may be checked every 5 years, or more frequently if you are over 9 years old. Skin check. Lung cancer screening. You may have this screening every year starting at age 68 if you have a 30-pack-year history of smoking and currently smoke or have quit within the past 15 years. Fecal occult blood test (FOBT) of the stool. You may have this test every year starting at age 71. Flexible sigmoidoscopy or colonoscopy. You may have a sigmoidoscopy every 5 years or a colonoscopy every 10 years starting at age 54. Hepatitis C blood test. Hepatitis B blood test. Sexually transmitted disease (STD) testing. Diabetes screening. This is done by checking your blood sugar (glucose) after you have not eaten for a while (fasting). You may have this done every 1-3 years. Bone density scan. This is done to screen for osteoporosis. You may have this done starting at age 15. Mammogram. This may be done every 1-2 years. Talk to your health care provider about how often you should have regular mammograms. Talk with your health care provider about your test results, treatment options, and if necessary, the need for more tests. Vaccines  Your health care provider may recommend certain vaccines, such as: Influenza vaccine. This is recommended every year. Tetanus, diphtheria, and acellular pertussis (Tdap, Td) vaccine. You may need a Td booster every 10 years. Zoster vaccine. You may need this after age 109. Pneumococcal 13-valent conjugate (PCV13) vaccine. One dose is recommended after age 43.  Pneumococcal  polysaccharide (PPSV23) vaccine. One dose is recommended after age 52. Talk to your health care provider about which screenings and vaccines you need and how often you need them. This information is not intended to replace advice given to you by your health care provider. Make sure you discuss any questions you have with your health care provider. Document Released: 12/09/2015 Document Revised: 08/01/2016 Document Reviewed: 09/13/2015 Elsevier Interactive Patient Education  2017 [employer] Prevention in the Home Falls can cause injuries. They can happen to people of all ages. There are many things you can do to make your home safe and to help prevent falls. What can I do on the outside of my home? Regularly fix the edges of walkways and driveways and fix any cracks. Remove anything that might make you trip as you walk through a door, such as a raised step or threshold. Trim any bushes or trees on the path to your home. Use bright outdoor lighting. Clear any walking paths of anything that might make someone trip, such as rocks or tools. Regularly check to see if handrails are loose or broken. Make sure that both sides of any steps have handrails. Any raised decks and porches should have guardrails on the edges. Have any leaves, snow, or ice cleared regularly. Use sand or salt on walking paths during winter. Clean up any spills in your garage right away. This includes oil or grease spills. What can I do in the bathroom? Use night lights. Install grab bars by the toilet and in the tub and shower. Do not use towel bars as grab bars. Use non-skid mats or decals in the tub or shower. If you need to sit down in the shower, use a plastic, non-slip stool. Keep the floor dry. Clean up any water that spills on the floor as soon as it happens. Remove soap buildup in the tub or shower regularly. Attach bath mats securely with double-sided non-slip rug tape. Do not have throw rugs and other  things on the floor that can make you trip. What can I do in the bedroom? Use night lights. Make sure that you have a light by your bed that is easy to reach. Do not use any sheets or blankets that are too big for your bed. They should not hang down onto the floor. Have a firm chair that has side arms. You can use this for support while you get dressed. Do not have throw rugs and other things on the floor that can make you trip. What can I do in the kitchen? Clean up any spills right away. Avoid walking on wet floors. Keep items that you use a lot in easy-to-reach places. If you need to reach something above you, use a strong step stool that has a grab bar. Keep electrical cords out of the way. Do not use floor polish or wax that makes floors slippery. If you must use wax, use non-skid floor wax. Do not have throw rugs and other things on the floor that can make you trip. What can I do with my stairs? Do not leave any items on the stairs. Make sure that there are handrails on both sides of the stairs and use them. Fix handrails that are broken or loose. Make sure that handrails are as long as the stairways. Check any carpeting to make sure that it is firmly attached to the stairs. Fix any carpet that is loose or worn. Avoid having throw rugs at the  top or bottom of the stairs. If you do have throw rugs, attach them to the floor with carpet tape. Make sure that you have a light switch at the top of the stairs and the bottom of the stairs. If you do not have them, ask someone to add them for you. What else can I do to help prevent falls? Wear shoes that: Do not have high heels. Have rubber bottoms. Are comfortable and fit you well. Are closed at the toe. Do not wear sandals. If you use a stepladder: Make sure that it is fully opened. Do not climb a closed stepladder. Make sure that both sides of the stepladder are locked into place. Ask someone to hold it for you, if possible. Clearly  mark and make sure that you can see: Any grab bars or handrails. First and last steps. Where the edge of each step is. Use tools that help you move around (mobility aids) if they are needed. These include: Canes. Walkers. Scooters. Crutches. Turn on the lights when you go into a dark area. Replace any light bulbs as soon as they burn out. Set up your furniture so you have a clear path. Avoid moving your furniture around. If any of your floors are uneven, fix them. If there are any pets around you, be aware of where they are. Review your medicines with your doctor. Some medicines can make you feel dizzy. This can increase your chance of falling. Ask your doctor what other things that you can do to help prevent falls. This information is not intended to replace advice given to you by your health care provider. Make sure you discuss any questions you have with your health care provider. Document Released: 09/08/2009 Document Revised: 04/19/2016 Document Reviewed: 12/17/2014 Elsevier Interactive Patient Education  2017 [employer].

## 2021-05-16 ENCOUNTER — Encounter (HOSPITAL_BASED_OUTPATIENT_CLINIC_OR_DEPARTMENT_OTHER): Payer: Self-pay

## 2021-05-16 ENCOUNTER — Ambulatory Visit (HOSPITAL_BASED_OUTPATIENT_CLINIC_OR_DEPARTMENT_OTHER)
Admission: RE | Admit: 2021-05-16 | Discharge: 2021-05-16 | Disposition: A | Discharge status: Home / Self Care | Payer: Medicare Other | Source: Ambulatory Visit | Attending: Family Medicine | Admitting: Family Medicine

## 2021-05-16 ENCOUNTER — Other Ambulatory Visit: Payer: Self-pay

## 2021-05-16 DIAGNOSIS — Z1231 Encounter for screening mammogram for malignant neoplasm of breast: Secondary | ICD-10-CM | POA: Insufficient documentation

## 2021-05-26 ENCOUNTER — Ambulatory Visit (INDEPENDENT_AMBULATORY_CARE_PROVIDER_SITE_OTHER): Payer: Medicare Other | Admitting: Family Medicine

## 2021-05-26 ENCOUNTER — Encounter: Payer: Self-pay | Admitting: Family Medicine

## 2021-05-26 ENCOUNTER — Other Ambulatory Visit: Payer: Self-pay

## 2021-05-26 VITALS — BP 130/68 | HR 104 | Temp 98.4°F | Resp 18 | Ht 61.0 in | Wt 183.0 lb

## 2021-05-26 DIAGNOSIS — R350 Frequency of micturition: Secondary | ICD-10-CM | POA: Diagnosis not present

## 2021-05-26 LAB — POC URINALSYSI DIPSTICK (AUTOMATED)
Bilirubin, UA: NEGATIVE
Blood, UA: NEGATIVE
Glucose, UA: NEGATIVE
Ketones, UA: NEGATIVE
Leukocytes, UA: NEGATIVE
Nitrite, UA: NEGATIVE
Protein, UA: NEGATIVE
Spec Grav, UA: 1.02 (ref 1.010–1.025)
Urobilinogen, UA: 0.2 E.U./dL
pH, UA: 6 (ref 5.0–8.0)

## 2021-05-26 NOTE — Progress Notes (Signed)
Patient ID: Holly Herring, female    DOB: 04/21/1951  Age: 70 y.o. MRN: [id number]    Subjective:  Subjective  HPI Holly Herring presents for an office visit today. She complains of back pain, urgency, dysuria (slight burning sensation) and UTI symptoms x1 week. She notes that on 05/22/2021 she could not stop urinating. She states that she is unable to urinate today at the office. She reports that her symptoms had worsen on 05/23/2021. She endorses taking OTC medications and it helped slightly. POCT Urinalysis Dipstick was performed at the office with no abnormality noted. She denies any chest pain, SOB, fever, abdominal pain, cough, chills, sore throat, HA, or N/V/D at this time.   Review of Systems  Constitutional:  Negative for chills, fatigue and fever.  HENT:  Negative for ear pain, rhinorrhea, sinus pressure, sinus pain, sore throat and tinnitus.   Eyes:  Negative for pain.  Respiratory:  Negative for cough, shortness of breath and wheezing.   Cardiovascular:  Negative for chest pain.  Gastrointestinal:  Negative for abdominal pain, anal bleeding, constipation, diarrhea, nausea and vomiting.  Genitourinary:  Positive for dysuria (buring), frequency and urgency.  Musculoskeletal:  Positive for back pain. Negative for neck pain.  Skin:  Negative for rash.  Neurological:  Negative for seizures, weakness, light-headedness, numbness and headaches.   History Past Medical History:  Diagnosis Date   Arthritis    Hypertension    Uterine prolapse    had a Gelhorn pessary implant    She has a past surgical history that includes Knee arthroscopy; Hernia repair; Oophorectomy; Total knee arthroplasty (Left, 04/13/2013); Reduction mammaplasty (Bilateral, 2002); Abdominal hysterectomy (2019); donut; Total knee arthroplasty (Right, 01/09/2021); and Knee Closed Reduction (Right, 03/20/2021).   Her family history includes Heart disease in her brother and mother.She reports that she quit  smoking about 42 years ago. She has never used smokeless tobacco. She reports current alcohol use of about 1.0 standard drink of alcohol per week. She reports that she does not use drugs.  Current Outpatient Medications on File Prior to Visit  Medication Sig Dispense Refill   aspirin EC 81 MG tablet Take 81 mg by mouth daily. Swallow whole.     calcium carbonate (OS-CAL) 600 MG TABS tablet Take 600 mg by mouth daily with breakfast.     estradiol (ESTRACE) 0.1 MG/GM vaginal cream Place 1 Applicatorful vaginally See admin instructions. Every 2 days     estradiol (ESTRACE) 1 MG tablet Take 1 tablet (1 mg total) by mouth daily. 90 tablet 3   lisinopril-hydrochlorothiazide (ZESTORETIC) 10-12.5 MG tablet Take 1 tablet by mouth daily. 90 tablet 2   Magnesium 500 MG CAPS Take 500 mg by mouth daily.     methocarbamol (ROBAXIN) 500 MG tablet Take 1 tablet (500 mg total) by mouth every 6 (six) hours as needed for muscle spasms. (Patient taking differently: Take 500 mg by mouth every 8 (eight) hours as needed for muscle spasms.) 40 tablet 0   Omega-3 Fatty Acids (FISH OIL) 500 MG CAPS Take 500 mg by mouth daily.     OVER THE COUNTER MEDICATION Take 1 capsule by mouth in the morning and at bedtime. Golo Release     POTASSIUM GLUCONATE PO Take 500 mg by mouth daily.     vitamin B-12 (CYANOCOBALAMIN) 1000 MCG tablet Take 1,000 mcg by mouth daily.     vitamin C (ASCORBIC ACID) 500 MG tablet Take 500 mg by mouth daily.     Zinc 50  MG CAPS Take 50 mg by mouth daily.     diphenhydrAMINE (SLEEP AID, DIPHENHYDRAMINE,) 25 MG tablet Take 50 mg by mouth at bedtime. (Patient not taking: Reported on 05/26/2021)     No current facility-administered medications on file prior to visit.     Objective:  Objective  Physical Exam Vitals and nursing note reviewed.  Constitutional:      General: She is not in acute distress.    Appearance: Normal appearance. She is well-developed. She is not ill-appearing.  HENT:     Head:  Normocephalic and atraumatic.     Right Ear: External ear normal.     Left Ear: External ear normal.     Nose: Nose normal.  Eyes:     General:        Right eye: No discharge.        Left eye: No discharge.     Extraocular Movements: Extraocular movements intact.     Pupils: Pupils are equal, round, and reactive to light.  Cardiovascular:     Rate and Rhythm: Normal rate and regular rhythm.     Pulses: Normal pulses.     Heart sounds: Normal heart sounds. No murmur heard.   No friction rub. No gallop.  Pulmonary:     Effort: Pulmonary effort is normal. No respiratory distress.     Breath sounds: Normal breath sounds. No stridor. No wheezing, rhonchi or rales.  Chest:     Chest wall: No tenderness.  Abdominal:     General: Bowel sounds are normal. There is no distension.     Palpations: Abdomen is soft. There is no mass.     Tenderness: There is no abdominal tenderness. There is no guarding or rebound.     Hernia: No hernia is present.  Musculoskeletal:        General: Normal range of motion.     Cervical back: Normal range of motion and neck supple.     Right lower leg: No edema.     Left lower leg: No edema.  Skin:    General: Skin is warm and dry.  Neurological:     Mental Status: She is alert and oriented to person, place, and time.  Psychiatric:        Behavior: Behavior normal.        Thought Content: Thought content normal.   BP 130/68 (BP Location: Right Arm, Patient Position: Sitting, Cuff Size: Large)   Pulse (!) 104   Temp 98.4 F (36.9 C) (Oral)   Resp 18   Ht 5\' 1"  (1.549 m)   Wt 183 lb (83 kg)   SpO2 97%   BMI 34.58 kg/m  Wt Readings from Last 3 Encounters:  05/26/21 183 lb (83 kg)  05/09/21 184 lb 9.6 oz (83.7 kg)  05/01/21 185 lb (83.9 kg)     Lab Results  Component Value Date   WBC 6.0 03/20/2021   HGB 12.3 03/20/2021   HCT 39.6 03/20/2021   PLT 355 03/20/2021   GLUCOSE 89 03/20/2021   CHOL 238 (H) 09/20/2020   TRIG 69 09/20/2020   HDL  103 09/20/2020   LDLDIRECT 108 01/25/2016   LDLCALC 119 (H) 09/20/2020   ALT 14 01/03/2021   AST 21 01/03/2021   NA 136 03/20/2021   K 3.8 03/20/2021   CL 103 03/20/2021   CREATININE 0.71 03/20/2021   BUN 12 03/20/2021   CO2 22 03/20/2021   TSH 2.582 01/17/2015   INR 0.9 01/03/2021  HGBA1C 5.8 11/29/2020    Mammogram 3D SCREEN BREAST BILATERAL  Result Date: 05/17/2021 CLINICAL DATA:  Screening. Personal history of BILATERAL reduction mammoplasty. EXAM: DIGITAL SCREENING BILATERAL MAMMOGRAM WITH TOMOSYNTHESIS AND CAD TECHNIQUE: Bilateral screening digital craniocaudal and mediolateral oblique mammograms were obtained. Bilateral screening digital breast tomosynthesis was performed. The images were evaluated with computer-aided detection. COMPARISON:  Previous exams 04/08/2018 and earlier from [facility] in [place], [place]. ACR Breast Density Category b: There are scattered areas of fibroglandular density. FINDINGS: There are no findings suspicious for malignancy. Stable post surgical changes in both breasts related to the prior reduction mammoplasty. IMPRESSION: No mammographic evidence of malignancy. A result letter of this screening mammogram will be mailed directly to the patient. RECOMMENDATION: Screening mammogram in one year. (Code:SM-B-01Y) BI-RADS CATEGORY  2: Benign. Electronically Signed   By: Hulan Saas M.D.   On: 05/17/2021 09:45     Assessment & Plan:  Plan   No orders of the defined types were placed in this encounter.   Problem List Items Addressed This Visit   None Visit Diagnoses     Urinary frequency    -  Primary   Relevant Orders   POCT Urinalysis Dipstick (Automated) (Completed)   Urine Culture     Ua normal Culture pending Pt will con't to drink plenty of fluids, take azo prn Call if symptoms worsen   Follow-up: Return if symptoms worsen or fail to improve.   I,Gordon Zheng,acting as a Neurosurgeon for [employer], DO.,have  documented all relevant documentation on the behalf of Donato Schultz, DO,as directed by  Donato Schultz, DO while in the presence of Donato Schultz, DO.   I, Donato Schultz, DO, have reviewed all documentation for this visit. The documentation on 05/26/21 for the exam, diagnosis, procedures, and orders are all accurate and complete.

## 2021-05-26 NOTE — Patient Instructions (Signed)
Dysuria ?Dysuria is pain or discomfort during urination. The pain or discomfort may be felt in the part of the body that drains urine from the bladder (urethra) or in the surrounding tissue of the genitals. The pain may also be felt in the groin area, lower abdomen, or lower back. ?You may have to urinate frequently or have the sudden feeling that you have to urinate (urgency). Dysuria can affect anyone, but it is more common in females. Dysuria can be caused by many different things, including: ?Urinary tract infection. ?Kidney stones or bladder stones. ?Certain STIs (sexually transmitted infections), such as chlamydia. ?Dehydration. ?Inflammation of the tissues of the vagina. ?Use of certain medicines. ?Use of certain soaps or scented products that cause irritation. ?Follow these instructions at home: ?Medicines ?Take over-the-counter and prescription medicines only as told by your health care provider. ?If you were prescribed an antibiotic medicine, take it as told by your health care provider. Do not stop taking the antibiotic even if you start to feel better. ?Eating and drinking ? ?Drink enough fluid to keep your urine pale yellow. ?Avoid caffeinated beverages, tea, and alcohol. These beverages can irritate the bladder and make dysuria worse. In males, alcohol may irritate the prostate. ?General instructions ?Watch your condition for any changes. ?Urinate often. Avoid holding urine for long periods of time. ?If you are female, you should wipe from front to back after urinating or having a bowel movement. Use each piece of toilet paper only once. ?Empty your bladder after sex. ?Keep all follow-up visits. This is important. ?If you had any tests done to find the cause of dysuria, it is up to you to get your test results. Ask your health care provider, or the department that is doing the test, when your results will be ready. ?Contact a health care provider if: ?You have a fever. ?You develop pain in your back or  sides. ?You have nausea or vomiting. ?You have blood in your urine. ?You are not urinating as often as you usually do. ?Get help right away if: ?Your pain is severe and not relieved with medicines. ?You cannot eat or drink without vomiting. ?You are confused. ?You have a rapid heartbeat while resting. ?You have shaking or chills. ?You feel extremely weak. ?Summary ?Dysuria is pain or discomfort while urinating. Many different conditions can lead to dysuria. ?If you have dysuria, you may have to urinate frequently or have the sudden feeling that you have to urinate (urgency). ?Watch your condition for any changes. Keep all follow-up visits. ?Make sure that you urinate often and drink enough fluid to keep your urine pale yellow. ?This information is not intended to replace advice given to you by your health care provider. Make sure you discuss any questions you have with your health care provider. ?Document Revised: 06/24/2020 Document Reviewed: 06/24/2020 ?Elsevier Patient Education ? 2022 Elsevier Inc. ? ?

## 2021-05-27 LAB — URINE CULTURE
MICRO NUMBER:: 12075177
Result:: NO GROWTH
SPECIMEN QUALITY:: ADEQUATE

## 2021-08-07 ENCOUNTER — Ambulatory Visit: Payer: Medicare Other | Admitting: Obstetrics & Gynecology

## 2021-08-07 ENCOUNTER — Encounter: Payer: Self-pay | Admitting: Obstetrics & Gynecology

## 2021-08-07 ENCOUNTER — Other Ambulatory Visit: Payer: Self-pay

## 2021-08-07 VITALS — BP 154/68 | HR 76 | Wt 189.0 lb

## 2021-08-07 DIAGNOSIS — Z4689 Encounter for fitting and adjustment of other specified devices: Secondary | ICD-10-CM

## 2021-08-07 NOTE — Progress Notes (Signed)
Patient presents for pessary cleaning. Armandina Stammer RN

## 2021-08-09 ENCOUNTER — Encounter: Payer: Self-pay | Admitting: Obstetrics & Gynecology

## 2021-08-09 NOTE — Progress Notes (Signed)
History:  70 y.o. G2P2000 here today for pessary removal and replacement after cleaning. Pt denies any  problems with pessary. She reports that her husband is on dialysis and she is his primary caretaker. She is attempting self care as well and has her daughter for support.   The following portions of the patient's history were reviewed and updated as appropriate: allergies, current medications, past family history, past medical history, past social history, past surgical history and problem list.  Review of Systems:  Pertinent items are noted in HPI.    Objective:  Physical Exam Blood pressure (!) 154/68, pulse 76, weight 189 lb (85.7 kg).  CONSTITUTIONAL: Well-developed, well-nourished female in no acute distress.  HENT:  Normocephalic, atraumatic EYES: Conjunctivae and EOM are normal. No scleral icterus.  NECK: Normal range of motion SKIN: Skin is warm and dry. No rash noted. Not diaphoretic.No pallor. NEUROLGIC: Alert and oriented to person, place, and time. Normal coordination.  GYN: Pessary removed and cleaned; vaginal mucosa looks healty and with no excoriations or breakdown    Assessment & Plan:  Pessary maintenance: F/u in 3 months to have pessary removed and cleaned F/u sooner prn   Total face-to-face time with patient, review of chart, discussion with consultant and coordination of care was .    Ellijah Leffel L. Harraway-Smith, M.D., [place] [place]

## 2021-09-13 ENCOUNTER — Telehealth: Payer: Self-pay | Admitting: General Practice

## 2021-09-13 NOTE — Telephone Encounter (Signed)
Pt is due for pessary cleaning in December with Dr. Erin Fulling.  Left message on VM for pt to contact our office to schedule 3 month follow up appt.

## 2021-09-19 ENCOUNTER — Other Ambulatory Visit: Payer: Self-pay

## 2021-09-20 ENCOUNTER — Other Ambulatory Visit: Payer: Self-pay | Admitting: Family Medicine

## 2021-09-20 ENCOUNTER — Ambulatory Visit (INDEPENDENT_AMBULATORY_CARE_PROVIDER_SITE_OTHER): Payer: Medicare Other | Admitting: Family Medicine

## 2021-09-20 ENCOUNTER — Encounter: Payer: Self-pay | Admitting: Family Medicine

## 2021-09-20 ENCOUNTER — Encounter: Payer: Medicare Other | Admitting: Family Medicine

## 2021-09-20 VITALS — BP 142/82 | HR 89 | Temp 98.9°F | Ht 61.0 in | Wt 189.5 lb

## 2021-09-20 DIAGNOSIS — Z0000 Encounter for general adult medical examination without abnormal findings: Secondary | ICD-10-CM | POA: Diagnosis not present

## 2021-09-20 DIAGNOSIS — R7303 Prediabetes: Secondary | ICD-10-CM | POA: Diagnosis not present

## 2021-09-20 DIAGNOSIS — E7800 Pure hypercholesterolemia, unspecified: Secondary | ICD-10-CM | POA: Diagnosis not present

## 2021-09-20 LAB — LIPID PANEL
Cholesterol: 237 mg/dL — ABNORMAL HIGH (ref 0–200)
HDL: 111.6 mg/dL (ref >39.00)
LDL Cholesterol: 114 mg/dL — ABNORMAL HIGH (ref 0–99)
NonHDL: 125.12
Total CHOL/HDL Ratio: 2
Triglycerides: 55 mg/dL (ref 0.0–149.0)
VLDL: 11 mg/dL (ref 0.0–40.0)

## 2021-09-20 LAB — COMPREHENSIVE METABOLIC PANEL
ALT: 13 U/L (ref 0–35)
AST: 25 U/L (ref 0–37)
Albumin: 4.2 g/dL (ref 3.5–5.2)
Alkaline Phosphatase: 43 U/L (ref 39–117)
BUN: 13 mg/dL (ref 6–23)
CO2: 31 mEq/L (ref 19–32)
Calcium: 9.8 mg/dL (ref 8.4–10.5)
Chloride: 101 mEq/L (ref 96–112)
Creatinine, Ser: 0.78 mg/dL (ref 0.40–1.20)
GFR: 77.87 mL/min (ref >60.00)
Glucose, Bld: 91 mg/dL (ref 70–99)
Potassium: 4.2 mEq/L (ref 3.5–5.1)
Sodium: 138 mEq/L (ref 135–145)
Total Bilirubin: 0.5 mg/dL (ref 0.2–1.2)
Total Protein: 7.4 g/dL (ref 6.0–8.3)

## 2021-09-20 LAB — HEMOGLOBIN A1C: Hgb A1c MFr Bld: 5.8 % (ref 4.6–6.5)

## 2021-09-20 MED ORDER — ROSUVASTATIN CALCIUM 20 MG PO TABS: 20.0000 mg | ORAL_TABLET | Freq: Every day | ORAL | 3 refills | Status: DC

## 2021-09-20 MED ORDER — LISINOPRIL-HYDROCHLOROTHIAZIDE 10-12.5 MG PO TABS: 1.0000 | ORAL_TABLET | Freq: Every day | ORAL | 2 refills | Status: DC

## 2021-09-20 NOTE — Progress Notes (Signed)
Chief Complaint  Patient presents with   Annual Exam     Well Woman Holly Herring is here for a complete physical.   Her last physical was >1 year ago.  Current diet: in general, diet could be better. Current exercise: cycling, some walking. Weight is stable and she is having fatigue, husband is in hospice Seatbelt? Yes  Health Maintenance Colonoscopy- Yes Shingrix- No DEXA- Yes Mammogram- Yes Tetanus- Yes Pneumonia- Due for PCV20 Hep C screen- Yes  Past Medical History:  Diagnosis Date   Arthritis    Hypertension    Uterine prolapse    had a Gelhorn pessary implant     Past Surgical History:  Procedure Laterality Date   ABDOMINAL HYSTERECTOMY  2019   donut     Gelhorn  pessary for uterine prolapse   HERNIA REPAIR     KNEE ARTHROSCOPY     Dr Hollice Gong   KNEE CLOSED REDUCTION Right 03/20/2021   Procedure: CLOSED MANIPULATION KNEE;  Surgeon: Ollen Gross, MD;  Location: [facility];  Service: Orthopedics;  Laterality: Right;    OOPHORECTOMY     one ovary removed due to ectopic   REDUCTION MAMMAPLASTY Bilateral 2002   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 Swallow whole.     calcium carbonate (OS-CAL) 600 MG TABS tablet Take 600 mg by mouth daily with breakfast.     diphenhydrAMINE (SLEEP AID, DIPHENHYDRAMINE,) 25 MG tablet Take 50 mg by mouth at bedtime.     estradiol (ESTRACE) 0.1 MG/GM vaginal cream Place 1 Applicatorful vaginally See admin instructions. Every 2 days     estradiol (ESTRACE)  1 MG tablet Take 1 tablet (1 mg total) by mouth daily. 90 tablet 3   lisinopril-hydrochlorothiazide (ZESTORETIC) 10-12.5 MG tablet Take 1 tablet by mouth daily. 90 tablet 2   Magnesium 500 MG CAPS Take 500 mg by mouth daily.     methocarbamol (ROBAXIN) 500 MG tablet Take 1 tablet (500 mg total) by mouth every 6 (six) hours as needed for muscle spasms. (Patient taking differently: Take 500 mg by mouth every 8 (eight) hours as needed for muscle spasms.) 40 tablet 0   Omega-3 Fatty Acids (FISH OIL) 500 MG CAPS Take 500 mg by mouth daily.     OVER THE COUNTER MEDICATION Take 1 capsule by mouth in the morning and at bedtime. Golo Release     POTASSIUM GLUCONATE PO Take 500 mg by mouth daily.     vitamin B-12 (CYANOCOBALAMIN) 1000 MCG tablet Take 1,000 mcg by mouth daily.     vitamin C (ASCORBIC ACID) 500 MG tablet Take 500 mg by mouth daily.     Zinc 50 MG CAPS Take 50 mg by mouth daily.      Allergies Allergies  Allergen Reactions   Codeine Itching    Can't sleep    Other     NO BLOOD OR BLOOD PRODUCTS   Penicillins Hives    Tolerated Cephalosporin Date: 01/10/21.     Percocet [Oxycodone-Acetaminophen] Itching  Has to take benadryl prior    Review of Systems: Constitutional:  no fevers Eye:  no recent significant change in vision Ears:  No changes in hearing Nose/Mouth/Throat:  no complaints of nasal congestion, no sore throat Cardiovascular: no chest pain Respiratory:  No shortness of breath Gastrointestinal:  No change in bowel habits GU:  Female: negative for dysuria Integumentary:  no abnormal skin lesions reported Neurologic:  no headaches Endocrine:  denies unexplained weight changes  Exam BP (!) 142/82   Pulse 89   Temp 98.9 F (37.2 C) (Oral)   Ht 5\' 1"  (1.549 m)   Wt 189 lb 8 oz (86 kg)   SpO2 99%   BMI 35.81 kg/m  General:  well developed, well nourished, in no apparent distress Skin: +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 Otherwise no significant moles, warts, or  growths Head:  no masses, lesions, or tenderness Eyes:  Grey rim around iris b/l; pupils equal and round, sclera anicteric without injection Ears:  canals without lesions, TMs shiny without retraction, no obvious effusion, no erythema Nose:  nares patent, septum midline, mucosa normal, and no drainage or sinus tenderness Throat/Pharynx:  lips and gingiva without lesion; tongue and uvula midline; non-inflamed pharynx; no exudates or postnasal drainage Neck: neck supple without adenopathy, thyromegaly, or masses Lungs:  clear to auscultation, breath sounds equal bilaterally, no respiratory distress Cardio:  regular rate and rhythm, no bruits or LE edema Abdomen:  abdomen soft, nontender; bowel sounds normal; no masses or organomegaly Genital: Deferred Neuro:  gait normal; deep tendon reflexes normal and symmetric Psych: well oriented with normal range of affect and appropriate judgment/insight  Assessment and Plan  Well adult exam  Pure hypercholesterolemia - Plan: Lipid panel, Comprehensive metabolic panel  Prediabetes - Plan: Hemoglobin A1c   Well 70 y.o. female. Counseled on diet and exercise. Other orders as above. There is a chance I will rec a statin given xanthoma, arcus senilis.  Shingrix rec'd. PCV20 next time when in stock.   Follow up in 6 mo or prn. The patient voiced understanding and agreement to the plan.  [address] [place], DO 09/20/21 8:57 AM

## 2021-09-20 NOTE — Patient Instructions (Addendum)
Give [place] 2-3 business days to get the results of your labs back. There is a reasonable chance I recommend a cholesterol-lowering medication to prevent further plaque accumulation.   Keep the diet clean and stay active.  Foods that may reduce pain: 1) Ginger 2) Blueberries 3) Salmon 4) Pumpkin seeds 5) dark chocolate 6) turmeric 7) tart cherries 8) virgin olive oil 9) chilli peppers 10) mint 11) red wine  Sleep Hygiene Tips: Do not watch TV or look at screens within 1 hour of going to bed. If you do, make sure there is a blue light filter (nighttime mode) involved. Try to go to bed around the same time every night. Wake up at the same time within 1 hour of regular time. Ex: If you wake up at 7 AM for work, do not sleep past 8 AM on days that you don't work. Do not drink alcohol before bedtime. Do not consume caffeine-containing beverages after noon or within 9 hours of intended bedtime. Get regular exercise/physical activity in your life, but not within 2 hours of planned bedtime. Do not take naps.  Do not eat within 2 hours of planned bedtime. Melatonin, 3-5 mg 30-60 minutes before planned bedtime may be helpful.  The bed should be for sleep or sex only. If after 20-30 minutes you are unable to fall asleep, get up and do something relaxing. Do this until you feel ready to go to sleep again.   The new Shingrix vaccine (for shingles) is a 2 shot series. It can make people feel low energy, achy and almost like they have the flu for 48 hours after injection. Please plan accordingly when deciding on when to get this shot. Call your pharmacy to get this. The second shot of the series is less severe regarding the side effects, but it still lasts 48 hours.   I recommend getting the updated bivalent covid vaccination booster at your convenience.   Let us know if you need anything.

## 2021-10-30 ENCOUNTER — Ambulatory Visit: Payer: Medicare Other | Admitting: Obstetrics & Gynecology

## 2021-10-30 ENCOUNTER — Encounter: Payer: Self-pay | Admitting: Obstetrics & Gynecology

## 2021-10-30 ENCOUNTER — Other Ambulatory Visit: Payer: Self-pay

## 2021-10-30 VITALS — BP 155/56 | HR 70 | Ht 61.0 in | Wt 197.0 lb

## 2021-10-30 DIAGNOSIS — N814 Uterovaginal prolapse, unspecified: Secondary | ICD-10-CM

## 2021-10-30 DIAGNOSIS — Z4689 Encounter for fitting and adjustment of other specified devices: Secondary | ICD-10-CM

## 2021-10-30 NOTE — Progress Notes (Signed)
History:  71 y.o. G2P2000 here today for pessary removal and replacement after cleaning. Pt denies any  problems with pessary. Her husband died in late September 30, 2023. She was his primary caretaker. Her 2 grandsons have moved in with her since that time.  She is attempting self care.  No GYN complaints. She is UTD with her mammogram.      The following portions of the patient's history were reviewed and updated as appropriate: allergies, current medications, past family history, past medical history, past social history, past surgical history and problem list.   Review of Systems:  Pertinent items are noted in HPI.    Objective:  Physical Exam Blood pressure (!) 154/68, pulse 76, weight 189 lb (85.7 kg).   CONSTITUTIONAL: Well-developed, well-nourished female in no acute distress.  HENT:  Normocephalic, atraumatic EYES: Conjunctivae and EOM are normal. No scleral icterus.  NECK: Normal range of motion SKIN: Skin is warm and dry. No rash noted. Not diaphoretic.No pallor. NEUROLGIC: Alert and oriented to person, place, and time. Normal coordination.  GYN: Pessary removed and cleaned; vaginal mucosa looks healty and with no excoriations or breakdown      Assessment & Plan:  Pessary maintenance: F/u in 3 months to have pessary removed and cleaned F/u sooner prn    Total face-to-face time with patient, review of chart, discussion with consultant and coordination of care was .     Adoni Greenough L. Harraway-Smith, M.D., [facility]

## 2021-10-30 NOTE — Progress Notes (Signed)
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 Armandina Stammer RN

## 2021-11-23 ENCOUNTER — Other Ambulatory Visit: Payer: Self-pay | Admitting: Family Medicine

## 2021-12-01 ENCOUNTER — Telehealth: Payer: Self-pay | Admitting: Family Medicine

## 2021-12-01 DIAGNOSIS — N952 Postmenopausal atrophic vaginitis: Secondary | ICD-10-CM

## 2021-12-01 MED ORDER — ESTRADIOL 1 MG PO TABS: 1.0000 mg | ORAL_TABLET | Freq: Every day | ORAL | 3 refills | Status: DC

## 2021-12-01 NOTE — Telephone Encounter (Signed)
Medication: estradiol (ESTRACE) 1 MG tablet   Has the patient contacted their pharmacy? Yes.   (If no, request that the patient contact the pharmacy for the refill.) (If yes, when and what did the pharmacy advise?)  Preferred Pharmacy (with phone number or street name): [facility] [address] [place], [place] - [address]  [address], [place] [address]  Phone:  [phone]  Fax:  [phone]   Agent: Please be advised that RX refills may take up to 3 business days. We ask that you follow-up with your pharmacy.

## 2021-12-01 NOTE — Telephone Encounter (Signed)
refilled 

## 2022-01-23 DIAGNOSIS — H2513 Age-related nuclear cataract, bilateral: Secondary | ICD-10-CM | POA: Diagnosis not present

## 2022-01-31 ENCOUNTER — Ambulatory Visit: Payer: Medicare Other | Admitting: Obstetrics & Gynecology

## 2022-01-31 ENCOUNTER — Encounter: Payer: Self-pay | Admitting: Obstetrics & Gynecology

## 2022-01-31 ENCOUNTER — Other Ambulatory Visit: Payer: Self-pay

## 2022-01-31 VITALS — BP 141/68 | HR 82

## 2022-01-31 DIAGNOSIS — N8110 Cystocele, unspecified: Secondary | ICD-10-CM

## 2022-01-31 DIAGNOSIS — Z4689 Encounter for fitting and adjustment of other specified devices: Secondary | ICD-10-CM | POA: Diagnosis not present

## 2022-01-31 NOTE — Progress Notes (Signed)
History:  ?71 y.o. G2P2000 here today for pessary removal and replacement after cleaning. Pt denies any  problems with pessary. Her husband has been deceased for 5 months and she is getting her life together and taking care of herself. Her grandsons are still living with her. No GYN complaints. She is UTD with her mammogram.    ?  ?The following portions of the patient's history were reviewed and updated as appropriate: allergies, current medications, past family history, past medical history, past social history, past surgical history and problem list. ?  ?Review of Systems:  ?Pertinent items are noted in HPI. ?   ?Objective:  ?BP (!) 141/68   Pulse 82  ? ? CONSTITUTIONAL: Well-developed, well-nourished female in no acute distress.  ?HENT:  Normocephalic, atraumatic ?EYES: Conjunctivae and EOM are normal. No scleral icterus.  ?NECK: Normal range of motion ?SKIN: Skin is warm and dry. No rash noted. Not diaphoretic.No pallor. ?NEUROLGIC: Alert and oriented to person, place, and time. Normal coordination.  ?GYN: Pessary removed and cleaned; vaginal mucosa looks healty and with no excoriations or breakdown. The cervix is without lesions. The bladder is prolapsed to the introitus without valsalva.    ?  ?  ?Assessment & Plan:  ?Pessary maintenance: ?F/u in 3 months to have pessary removed and cleaned ?F/u sooner prn  ?  ?Total face-to-face time with patient, review of chart, discussion with consultant and coordination of care was .   ?  ?Mega Kinkade L. Harraway-Smith, M.D., FACOG ?  ? ?

## 2022-01-31 NOTE — Progress Notes (Signed)
Patient presents for pessary check. Teryn Boerema RN  

## 2022-03-21 ENCOUNTER — Encounter: Payer: Self-pay | Admitting: Family Medicine

## 2022-03-21 ENCOUNTER — Ambulatory Visit (INDEPENDENT_AMBULATORY_CARE_PROVIDER_SITE_OTHER): Payer: Medicare Other | Admitting: Family Medicine

## 2022-03-21 VITALS — BP 138/80 | HR 85 | Temp 98.0°F | Ht 61.0 in | Wt 217.0 lb

## 2022-03-21 DIAGNOSIS — I10 Essential (primary) hypertension: Secondary | ICD-10-CM

## 2022-03-21 DIAGNOSIS — Z23 Encounter for immunization: Secondary | ICD-10-CM

## 2022-03-21 DIAGNOSIS — E7800 Pure hypercholesterolemia, unspecified: Secondary | ICD-10-CM

## 2022-03-21 LAB — COMPREHENSIVE METABOLIC PANEL
ALT: 15 U/L (ref 0–35)
AST: 20 U/L (ref 0–37)
Albumin: 4.3 g/dL (ref 3.5–5.2)
Alkaline Phosphatase: 43 U/L (ref 39–117)
BUN: 16 mg/dL (ref 6–23)
CO2: 29 mEq/L (ref 19–32)
Calcium: 10.1 mg/dL (ref 8.4–10.5)
Chloride: 101 mEq/L (ref 96–112)
Creatinine, Ser: 0.81 mg/dL (ref 0.40–1.20)
GFR: 74.17 mL/min (ref >60.00)
Glucose, Bld: 86 mg/dL (ref 70–99)
Potassium: 4.2 mEq/L (ref 3.5–5.1)
Sodium: 138 mEq/L (ref 135–145)
Total Bilirubin: 0.4 mg/dL (ref 0.2–1.2)
Total Protein: 7.5 g/dL (ref 6.0–8.3)

## 2022-03-21 LAB — LIPID PANEL
Cholesterol: 181 mg/dL (ref 0–200)
HDL: 113.8 mg/dL (ref >39.00)
LDL Cholesterol: 59 mg/dL (ref 0–99)
NonHDL: 67.57
Total CHOL/HDL Ratio: 2
Triglycerides: 45 mg/dL (ref 0.0–149.0)
VLDL: 9 mg/dL (ref 0.0–40.0)

## 2022-03-21 NOTE — Progress Notes (Signed)
Chief Complaint  ?Patient presents with  ? Follow-up  ? ? ?Subjective ?Holly Herring is a 71 y.o. female who presents for hypertension follow up. ?She does not monitor home blood pressures. ?She is compliant with medications- Zestoretic 10-12.5 mg/d. ?Patient has these side effects of medication: none ?She is sometimes adhering to a healthy diet overall. ?Current exercise: cycling ?No CP or SOB.  ? ?Hyperlipidemia ?Patient presents for dyslipidemia follow up. ?Currently being treated with Crestor 20 mg/d and compliance with treatment thus far has been good. ?She denies myalgias. ?Diet/exercise as above.  ?The patient is not known to have coexisting coronary artery disease. ?  ?Past Medical History:  ?Diagnosis Date  ? Arthritis   ? Hypertension   ? Uterine prolapse   ? had a Gelhorn pessary implant  ? ? ?Exam ?BP 138/80   Pulse 85   Temp 98 ?F (36.7 ?C) (Oral)   Ht 5\' 1"  (1.549 m)   Wt 217 lb (98.4 kg)   SpO2 99%   BMI 41.00 kg/m?  ?General:  well developed, well nourished, in no apparent distress ?Heart: RRR, no bruits, no LE edema ?Lungs: clear to auscultation, no accessory muscle use ?Psych: well oriented with normal range of affect and appropriate judgment/insight ? ?Essential hypertension ? ?Pure hypercholesterolemia - Plan: Lipid panel, Comprehensive metabolic panel ? ?Chronic, stable. Cont Zestoretic 10-12.5 mg/d. Counseled on diet and exercise. ?Chronic, stable. Cont Crestor 20 mg/d.  ?PCV20 today.  ?F/u in 6 mo. ?The patient voiced understanding and agreement to the plan. ? ? , DO ?03/21/22  ?9:19 AM ? ?

## 2022-03-21 NOTE — Addendum Note (Signed)
Addended by: Scharlene Gloss B on: 03/21/2022 09:25 AM ? ? Modules accepted: Orders ? ?

## 2022-03-21 NOTE — Patient Instructions (Addendum)
Give [place] 2-3 business days to get the results of your labs back.  ? ?Keep the diet clean and stay active. ? ?Aim to do some physical exertion for 150 minutes per week. This is typically divided into 5 days per week, 30 minutes per day. The activity should be enough to get your heart rate up. Anything is better than nothing if you have time constraints. Try to get some weight resistance exercise in your routine.  ? ?Let us know if you need anything. ?

## 2022-04-05 ENCOUNTER — Other Ambulatory Visit (HOSPITAL_BASED_OUTPATIENT_CLINIC_OR_DEPARTMENT_OTHER): Payer: Self-pay | Admitting: Family Medicine

## 2022-04-05 DIAGNOSIS — Z1231 Encounter for screening mammogram for malignant neoplasm of breast: Secondary | ICD-10-CM

## 2022-05-02 ENCOUNTER — Ambulatory Visit: Payer: Medicare Other | Admitting: Obstetrics & Gynecology

## 2022-05-16 ENCOUNTER — Ambulatory Visit: Payer: Medicare Other | Admitting: Obstetrics & Gynecology

## 2022-05-16 ENCOUNTER — Encounter: Payer: Self-pay | Admitting: Obstetrics & Gynecology

## 2022-05-16 VITALS — BP 130/43 | HR 75 | Wt 217.0 lb

## 2022-05-16 DIAGNOSIS — N814 Uterovaginal prolapse, unspecified: Secondary | ICD-10-CM | POA: Diagnosis not present

## 2022-05-16 NOTE — Progress Notes (Signed)
History:  71 y.o. G2P2000 here today for pessary removal and replacement after cleaning. Pt denies any  problems with pessary. Pt has been exercising and doing ministry daily. Her grandsons no longer live with her. She is thriving. GYN complaints. She is UTD with her mammogram.      The following portions of the patient's history were reviewed and updated as appropriate: allergies, current medications, past family history, past medical history, past social history, past surgical history and problem list.   Review of Systems:  Pertinent items are noted in HPI.   BP (!) 130/43   Pulse 75   Wt 217 lb (98.4 kg)   BMI 41.00 kg/m      CONSTITUTIONAL: Well-developed, well-nourished female in no acute distress.  HENT:  Normocephalic, atraumatic EYES: Conjunctivae and EOM are normal. No scleral icterus.  NECK: Normal range of motion SKIN: Skin is warm and dry. No rash noted. Not diaphoretic.No pallor. NEUROLGIC: Alert and oriented to person, place, and time. Normal coordination.  GYN: Pessary removed and cleaned; vaginal mucosa looks healty and with no excoriations or breakdown. The cervix is without lesions. The bladder is prolapsed to the introitus without valsalva.        Assessment & Plan:  Pessary maintenance: F/u in 3 months to have pessary removed and cleaned F/u sooner prn    Total face-to-face time with patient, review of chart, discussion with consultant and coordination of care was .     Sharee Sturdy L. Harraway-Smith, M.D., [facility]

## 2022-05-21 ENCOUNTER — Ambulatory Visit (HOSPITAL_BASED_OUTPATIENT_CLINIC_OR_DEPARTMENT_OTHER)
Admission: RE | Admit: 2022-05-21 | Discharge: 2022-05-21 | Disposition: A | Discharge status: Home / Self Care | Payer: Medicare Other | Source: Ambulatory Visit | Attending: Family Medicine | Admitting: Family Medicine

## 2022-05-21 ENCOUNTER — Encounter (HOSPITAL_BASED_OUTPATIENT_CLINIC_OR_DEPARTMENT_OTHER): Payer: Self-pay

## 2022-05-21 DIAGNOSIS — Z1231 Encounter for screening mammogram for malignant neoplasm of breast: Secondary | ICD-10-CM | POA: Diagnosis not present

## 2022-06-04 ENCOUNTER — Ambulatory Visit (INDEPENDENT_AMBULATORY_CARE_PROVIDER_SITE_OTHER): Payer: Medicare Other

## 2022-06-04 VITALS — Ht 61.0 in | Wt 214.0 lb

## 2022-06-04 DIAGNOSIS — Z0000 Encounter for general adult medical examination without abnormal findings: Secondary | ICD-10-CM | POA: Diagnosis not present

## 2022-06-04 DIAGNOSIS — Z780 Asymptomatic menopausal state: Secondary | ICD-10-CM | POA: Diagnosis not present

## 2022-06-04 NOTE — Patient Instructions (Signed)
Holly Herring , Thank you for taking time to complete your Medicare Wellness Visit. I appreciate your ongoing commitment to your health goals. Please review the following plan we discussed and let me know if I can assist you in the future.   Screening recommendations/referrals: Colonoscopy: Completed 09/21/2018-Due 09/21/2028 Mammogram: Completed 05/21/2022-Due 05/22/2023 Bone Density: Ordered today. Someone will call you to schedule. Recommended yearly ophthalmology/optometry visit for glaucoma screening and checkup Recommended yearly dental visit for hygiene and checkup  Vaccinations: Influenza vaccine: Declined Pneumococcal vaccine: Up to date Tdap vaccine: Due-May obtain vaccine at your local pharmacy. Shingles vaccine: Completed vaccines   Covid-19:Up to date  Advanced directives: Copy in chart  Conditions/risks identified: See problem list  Next appointment: Follow up in one year for your annual wellness visit    Preventive Care 65 Years and Older, Female Preventive care refers to lifestyle choices and visits with your health care provider that can promote health and wellness. What does preventive care include? A yearly physical exam. This is also called an annual well check. Dental exams once or twice a year. Routine eye exams. Ask your health care provider how often you should have your eyes checked. Personal lifestyle choices, including: Daily care of your teeth and gums. Regular physical activity. Eating a healthy diet. Avoiding tobacco and drug use. Limiting alcohol use. Practicing safe sex. Taking low-dose aspirin every day. Taking vitamin and mineral supplements as recommended by your health care provider. What happens during an annual well check? The services and screenings done by your health care provider during your annual well check will depend on your age, overall health, lifestyle risk factors, and family history of disease. Counseling  Your health care  provider may ask you questions about your: Alcohol use. Tobacco use. Drug use. Emotional well-being. Home and relationship well-being. Sexual activity. Eating habits. History of falls. Memory and ability to understand (cognition). Work and work Astronomer. Reproductive health. Screening  You may have the following tests or measurements: Height, weight, and BMI. Blood pressure. Lipid and cholesterol levels. These may be checked every 5 years, or more frequently if you are over 34 years old. Skin check. Lung cancer screening. You may have this screening every year starting at age 95 if you have a 30-pack-year history of smoking and currently smoke or have quit within the past 15 years. Fecal occult blood test (FOBT) of the stool. You may have this test every year starting at age 70. Flexible sigmoidoscopy or colonoscopy. You may have a sigmoidoscopy every 5 years or a colonoscopy every 10 years starting at age 48. Hepatitis C blood test. Hepatitis B blood test. Sexually transmitted disease (STD) testing. Diabetes screening. This is done by checking your blood sugar (glucose) after you have not eaten for a while (fasting). You may have this done every 1-3 years. Bone density scan. This is done to screen for osteoporosis. You may have this done starting at age 74. Mammogram. This may be done every 1-2 years. Talk to your health care provider about how often you should have regular mammograms. Talk with your health care provider about your test results, treatment options, and if necessary, the need for more tests. Vaccines  Your health care provider may recommend certain vaccines, such as: Influenza vaccine. This is recommended every year. Tetanus, diphtheria, and acellular pertussis (Tdap, Td) vaccine. You may need a Td booster every 10 years. Zoster vaccine. You may need this after age 90. Pneumococcal 13-valent conjugate (PCV13) vaccine. One dose is recommended after  age  24. Pneumococcal polysaccharide (PPSV23) vaccine. One dose is recommended after age 48. Talk to your health care provider about which screenings and vaccines you need and how often you need them. This information is not intended to replace advice given to you by your health care provider. Make sure you discuss any questions you have with your health care provider. Document Released: 12/09/2015 Document Revised: 08/01/2016 Document Reviewed: 09/13/2015 Elsevier Interactive Patient Education  2017 [place] Prevention in the Home Falls can cause injuries. They can happen to people of all ages. There are many things you can do to make your home safe and to help prevent falls. What can I do on the outside of my home? Regularly fix the edges of walkways and driveways and fix any cracks. Remove anything that might make you trip as you walk through a door, such as a raised step or threshold. Trim any bushes or trees on the path to your home. Use bright outdoor lighting. Clear any walking paths of anything that might make someone trip, such as rocks or tools. Regularly check to see if handrails are loose or broken. Make sure that both sides of any steps have handrails. Any raised decks and porches should have guardrails on the edges. Have any leaves, snow, or ice cleared regularly. Use sand or salt on walking paths during winter. Clean up any spills in your garage right away. This includes oil or grease spills. What can I do in the bathroom? Use night lights. Install grab bars by the toilet and in the tub and shower. Do not use towel bars as grab bars. Use non-skid mats or decals in the tub or shower. If you need to sit down in the shower, use a plastic, non-slip stool. Keep the floor dry. Clean up any water that spills on the floor as soon as it happens. Remove soap buildup in the tub or shower regularly. Attach bath mats securely with double-sided non-slip rug tape. Do not have throw  rugs and other things on the floor that can make you trip. What can I do in the bedroom? Use night lights. Make sure that you have a light by your bed that is easy to reach. Do not use any sheets or blankets that are too big for your bed. They should not hang down onto the floor. Have a firm chair that has side arms. You can use this for support while you get dressed. Do not have throw rugs and other things on the floor that can make you trip. What can I do in the kitchen? Clean up any spills right away. Avoid walking on wet floors. Keep items that you use a lot in easy-to-reach places. If you need to reach something above you, use a strong step stool that has a grab bar. Keep electrical cords out of the way. Do not use floor polish or wax that makes floors slippery. If you must use wax, use non-skid floor wax. Do not have throw rugs and other things on the floor that can make you trip. What can I do with my stairs? Do not leave any items on the stairs. Make sure that there are handrails on both sides of the stairs and use them. Fix handrails that are broken or loose. Make sure that handrails are as long as the stairways. Check any carpeting to make sure that it is firmly attached to the stairs. Fix any carpet that is loose or worn. Avoid having throw rugs  at the top or bottom of the stairs. If you do have throw rugs, attach them to the floor with carpet tape. Make sure that you have a light switch at the top of the stairs and the bottom of the stairs. If you do not have them, ask someone to add them for you. What else can I do to help prevent falls? Wear shoes that: Do not have high heels. Have rubber bottoms. Are comfortable and fit you well. Are closed at the toe. Do not wear sandals. If you use a stepladder: Make sure that it is fully opened. Do not climb a closed stepladder. Make sure that both sides of the stepladder are locked into place. Ask someone to hold it for you, if  possible. Clearly mark and make sure that you can see: Any grab bars or handrails. First and last steps. Where the edge of each step is. Use tools that help you move around (mobility aids) if they are needed. These include: Canes. Walkers. Scooters. Crutches. Turn on the lights when you go into a dark area. Replace any light bulbs as soon as they burn out. Set up your furniture so you have a clear path. Avoid moving your furniture around. If any of your floors are uneven, fix them. If there are any pets around you, be aware of where they are. Review your medicines with your doctor. Some medicines can make you feel dizzy. This can increase your chance of falling. Ask your doctor what other things that you can do to help prevent falls. This information is not intended to replace advice given to you by your health care provider. Make sure you discuss any questions you have with your health care provider. Document Released: 09/08/2009 Document Revised: 04/19/2016 Document Reviewed: 12/17/2014 Elsevier Interactive Patient Education  2017 [employer].

## 2022-06-04 NOTE — Progress Notes (Signed)
Subjective:   Holly Herring is a 71 y.o. female who presents for Medicare Annual (Subsequent) preventive examination.  I connected with Holly Herring today by telephone and verified that I am speaking with the correct person using two identifiers. Location patient: home Location provider: work Persons participating in the virtual visit: patient, Engineer, civil (consulting)nurse.    I discussed the limitations, risks, security and privacy concerns of performing an evaluation and management service by telephone and the availability of in person appointments. I also discussed with the patient that there may be a patient responsible charge related to this service. The patient expressed understanding and verbally consented to this telephonic visit.    Interactive audio and video telecommunications were attempted between this provider and patient, however failed, due to patient having technical difficulties OR patient did not have access to video capability.  We continued and completed visit with audio only.  Some vital signs may be absent or patient reported.   Time Spent with patient on telephone encounter: 20 minutes   Review of Systems     Cardiac Risk Factors include: advanced age (>6155men, 75>65 women);hypertension;dyslipidemia     Objective:    Today's Vitals   06/04/22 1136  Weight: 214 lb (97.1 kg)  Height: 5\' 1"  (1.549 m)   Body mass index is 40.43 kg/m.     06/04/2022   11:38 AM 05/09/2021    8:40 AM 03/15/2021    1:31 PM 01/09/2021   12:02 PM 01/03/2021    9:22 AM 03/03/2020   10:35 AM 01/25/2016    8:36 AM  Advanced Directives  Does Patient Have a Medical Advance Directive? Yes No Yes No No Yes No  Type of Estate agentAdvance Directive Healthcare Power of [place];Living will  Healthcare Power of [place];Living will   Healthcare Power of [place];Living will   Does patient want to make changes to medical advance directive?      No - Patient declined   Copy of Healthcare Power of Attorney in Chart? Yes - validated  most recent copy scanned in chart (See row information)  Yes - validated most recent copy scanned in chart (See row information)   No - copy requested   Would patient like information on creating a medical advance directive?  No - Patient declined  No - Patient declined No - Patient declined  No - patient declined information    Current Medications (verified) Outpatient Encounter Medications as of 06/04/2022  Medication Sig   aspirin EC 81 MG tablet Take 81 mg by mouth daily. Swallow whole.   calcium carbonate (OS-CAL) 600 MG TABS tablet Take 600 mg by mouth daily with breakfast.   diphenhydrAMINE (SLEEP AID, DIPHENHYDRAMINE,) 25 MG tablet Take 50 mg by mouth at bedtime.   estradiol (ESTRACE) 0.1 MG/GM vaginal cream Place 1 Applicatorful vaginally See admin instructions. Every 2 days   estradiol (ESTRACE) 1 MG tablet Take 1 tablet (1 mg total) by mouth daily.   lisinopril-hydrochlorothiazide (ZESTORETIC) 10-12.5 MG tablet Take 1 tablet by mouth once daily   Magnesium 500 MG CAPS Take 500 mg by mouth daily.   Omega-3 Fatty Acids (FISH OIL) 500 MG CAPS Take 500 mg by mouth daily.   OVER THE COUNTER MEDICATION Take 1 capsule by mouth in the morning and at bedtime. Golo Release   POTASSIUM GLUCONATE PO Take 500 mg by mouth daily.   rosuvastatin (CRESTOR) 20 MG tablet Take 1 tablet (20 mg total) by mouth daily.   vitamin B-12 (CYANOCOBALAMIN) 1000 MCG tablet Take 1,000  mcg by mouth daily.   vitamin C (ASCORBIC ACID) 500 MG tablet Take 500 mg by mouth daily.   Zinc 50 MG CAPS Take 50 mg by mouth daily.   No facility-administered encounter medications on file as of 06/04/2022.    Allergies (verified) Codeine, Other, Penicillins, and Percocet [oxycodone-acetaminophen]   History: Past Medical History:  Diagnosis Date   Arthritis    Hypertension    Uterine prolapse    had a Gelhorn pessary implant   Past Surgical History:  Procedure Laterality Date   ABDOMINAL HYSTERECTOMY  2019   donut      Gelhorn  pessary for uterine prolapse   HERNIA REPAIR     KNEE ARTHROSCOPY     Dr Hollice Gong   KNEE CLOSED REDUCTION Right 03/20/2021   Procedure: CLOSED MANIPULATION KNEE;  Surgeon: Ollen Gross, MD;  Location: [facility];  Service: Orthopedics;  Laterality: Right;    OOPHORECTOMY     one ovary removed due to ectopic   REDUCTION MAMMAPLASTY Bilateral 2002   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   Procedure: TOTAL KNEE ARTHROPLASTY;  Surgeon: Nestor Lewandowsky, MD;  Location: [facility];  Service: Orthopedics;  Laterality: Left;  DEPUY/SIGMA   TOTAL KNEE ARTHROPLASTY Right 01/09/2021   Procedure: TOTAL KNEE ARTHROPLASTY;  Surgeon: Ollen Gross, MD;  Location: [facility];  Service: Orthopedics;  Laterality: Right;    Family History  Problem Relation Age of Onset   Heart disease Mother    Heart disease Brother    Social History   Socioeconomic History   Marital status: Widowed    Spouse name: Not on file   Number of children: Not on file   Years of education: Not on file   Highest education level: Not on file  Occupational History   Not on file  Tobacco Use   Smoking status: Former    Types: Cigarettes    Quit date: 11/26/1978    Years since quitting: 43.5   Smokeless tobacco: Never  Vaping Use   Vaping Use: Never used  Substance and Sexual Activity   Alcohol use: Yes    Alcohol/week: 1.0 standard drink of alcohol    Types: 1 Glasses of wine per week    Comment: occasionally   Drug use: No   Sexual activity: Not on file  Other Topics Concern   Not on file  Social History Narrative   Not on file   Social Determinants of Health   Financial Resource Strain: Low Risk  (06/04/2022)   Overall Financial Resource Strain (CARDIA)    Difficulty of Paying Living Expenses: Not hard at all  Food Insecurity: No Food Insecurity (06/04/2022)   Hunger Vital Sign    Worried About Running Out of Food in the Last Year: Never true    Ran Out of Food in the Last Year: Never true   Transportation Needs: No Transportation Needs (06/04/2022)   PRAPARE - Administrator, Civil Service (Medical): No    Lack of Transportation (Non-Medical): No  Physical Activity: Sufficiently Active (06/04/2022)   Exercise Vital Sign    Days of Exercise per Week: 5 days    Minutes of Exercise per Session: 60 min  Stress: No Stress Concern Present (06/04/2022)   [employer] of Occupational Health - Occupational Stress Questionnaire    Feeling of Stress : Not at all  Social Connections: Moderately Integrated (05/09/2021)   Social Connection and Isolation Panel [NHANES]    Frequency of Communication with Friends  and Family: More than three times a week    Frequency of Social Gatherings with Friends and Family: More than three times a week    Attends Religious Services: More than 4 times per year    Active Member of [employer] or Organizations: No    Attends Engineer, structural: Never    Marital Status: Married    Tobacco Counseling Counseling given: Not Answered   Clinical Intake:  Pre-visit preparation completed: Yes  Pain : No/denies pain     BMI - recorded: 40.13 Nutritional Status: BMI > 30  Obese Nutritional Risks: None Diabetes: No  How often do you need to have someone help you when you read instructions, pamphlets, or other written materials from your doctor or pharmacy?: 1 - Never  Diabetic?No  Interpreter Needed?: No  Information entered by :: Thomasenia Sales LPN   Activities of Daily Living    06/04/2022   11:40 AM 05/28/2022    2:17 PM  In your present state of health, do you have any difficulty performing the following activities:  Hearing? 0 0  Vision? 0 0  Difficulty concentrating or making decisions? 0 0  Walking or climbing stairs? 0 0  Dressing or bathing? 0 0  Doing errands, shopping? 0 0  Preparing Food and eating ? N N  Using the Toilet? N N  In the past six months, have you accidently leaked urine? N N  Do you have  problems with loss of bowel control? N N  Managing your Medications? N N  Managing your Finances? N N  Housekeeping or managing your Housekeeping? N N    Patient Care Team: Sharlene Dory, DO as PCP - General (Family Medicine)  Indicate any recent Medical Services you may have received from other than [facility] providers in the past year (date may be approximate).     Assessment:   This is a routine wellness examination for [place].  Hearing/Vision screen Hearing Screening - Comments:: No issues Vision Screening - Comments:: Last eye exam-2023  Dietary issues and exercise activities discussed: Current Exercise Habits: Home exercise routine, Type of exercise: strength training/weights;treadmill, Time (Minutes): 60, Frequency (Times/Week): 5, Weekly Exercise (Minutes/Week): 300, Intensity: Mild, Exercise limited by: None identified   Goals Addressed             This Visit's Progress    Maintain health and independence   On track    Patient Stated   On track    Continue to lose weight       Depression Screen    06/04/2022   11:40 AM 09/20/2021    8:33 AM 05/09/2021    8:41 AM 03/21/2021    8:40 AM 01/25/2016    8:36 AM 01/17/2015    3:17 PM  PHQ 2/9 Scores  PHQ - 2 Score 0 0 0 0 0 0    Fall Risk    06/04/2022   11:39 AM 05/28/2022    2:17 PM 05/09/2021    8:41 AM 03/03/2020   10:38 AM 01/17/2015    3:17 PM  Fall Risk   Falls in the past year? 0 0 0 0 No  Number falls in past yr: 0  0 0   Injury with Fall? 0 0 0 0   Follow up Falls prevention discussed  Falls prevention discussed Education provided;Falls prevention discussed     FALL RISK PREVENTION PERTAINING TO THE HOME:  Any stairs in or around the home? No  Home free of loose  throw rugs in walkways, pet beds, electrical cords, etc? Yes  Adequate lighting in your home to reduce risk of falls? Yes   ASSISTIVE DEVICES UTILIZED TO PREVENT FALLS:  Life alert? No  Use of a cane, walker or w/c? No  Grab bars in  the bathroom? Yes  Shower chair or bench in shower? Yes  Elevated toilet seat or a handicapped toilet? No   TIMED UP AND GO:  Was the test performed? No . Phone visit   Cognitive Function:Normal cognitive status assessed by this Nurse Health Advisor. No abnormalities found.          Immunizations Immunization History  Administered Date(s) Administered   PFIZER(Purple Top)SARS-COV-2 Vaccination 01/17/2020, 02/09/2020, 08/27/2020, 04/12/2021   PNEUMOCOCCAL CONJUGATE-20 03/21/2022   PPD Test 12/12/2012   Pfizer Covid-19 Vaccine Bivalent Booster 73yrs & up 08/24/2021   Pneumococcal Polysaccharide-23 09/22/2019   Zoster Recombinat (Shingrix) 03/08/2022    TDAP status: Due, Education has been provided regarding the importance of this vaccine. Advised may receive this vaccine at local pharmacy or Health Dept. Aware to provide a copy of the vaccination record if obtained from local pharmacy or Health Dept. Verbalized acceptance and understanding.  Flu Vaccine status: Declined, Education has been provided regarding the importance of this vaccine but patient still declined. Advised may receive this vaccine at local pharmacy or Health Dept. Aware to provide a copy of the vaccination record if obtained from local pharmacy or Health Dept. Verbalized acceptance and understanding.  Pneumococcal vaccine status: Up to date  Covid-19 vaccine status: Completed vaccines  Qualifies for Shingles Vaccine?  completed first dose of shingles 03/08/2022  Screening Tests Health Maintenance  Topic Date Due   Zoster Vaccines- Shingrix (2 of 2) 05/03/2022   COVID-19 Vaccine (6 - Pfizer series) 09/20/2022 (Originally 12/24/2021)   MAMMOGRAM  05/21/2024   COLONOSCOPY (Pts 45-69yrs Insurance coverage will need to be confirmed)  09/21/2028   Pneumonia Vaccine 35+ Years old  Completed   DEXA SCAN  Completed   Hepatitis C Screening  Completed   HPV VACCINES  Aged Out   INFLUENZA VACCINE  Discontinued    TETANUS/TDAP  Discontinued    Health Maintenance  Health Maintenance Due  Topic Date Due   Zoster Vaccines- Shingrix (2 of 2) 05/03/2022    Colorectal cancer screening: Type of screening: Colonoscopy. Completed 09/21/2018. Repeat every 10 years  Mammogram status: Completed bilateral 05/21/2022. Repeat every year  Bone Density status: Ordered today. Pt provided with contact info and advised to call to schedule appt.  Lung Cancer Screening: (Low Dose CT Chest recommended if Age 52-80 years, 30 pack-year currently smoking OR have quit w/in 15years.) does not qualify.     Additional Screening:  Hepatitis C Screening: ; Completed 01/25/2016  Vision Screening: Recommended annual ophthalmology exams for early detection of glaucoma and other disorders of the eye. Is the patient up to date with their annual eye exam?  Yes  Who is the provider or what is the name of the office in which the patient attends annual eye exams? Pt unsure of name  Dental Screening: Recommended annual dental exams for proper oral hygiene  Community Resource Referral / Chronic Care Management: CRR required this visit?  No   CCM required this visit?  No      Plan:     I have personally reviewed and noted the following in the patient's chart:   Medical and social history Use of alcohol, tobacco or illicit drugs  Current medications and supplements  including opioid prescriptions.  Functional ability and status Nutritional status Physical activity Advanced directives List of other physicians Hospitalizations, surgeries, and ER visits in previous 12 months Vitals Screenings to include cognitive, depression, and falls Referrals and appointments  In addition, I have reviewed and discussed with patient certain preventive protocols, quality metrics, and best practice recommendations. A written personalized care plan for preventive services as well as general preventive health recommendations were provided to  patient.   Due to this being a telephonic visit, the after visit summary with patients personalized plan was offered to patient via mail or my-chart.  Patient would like to access on my-chart.  Roanna Raider, LPN   06/20/2034  Nurse Health Advisor  Nurse Notes: None

## 2022-06-18 ENCOUNTER — Other Ambulatory Visit: Payer: Self-pay

## 2022-06-18 ENCOUNTER — Ambulatory Visit (HOSPITAL_BASED_OUTPATIENT_CLINIC_OR_DEPARTMENT_OTHER)
Admission: RE | Admit: 2022-06-18 | Discharge: 2022-06-18 | Disposition: A | Discharge status: Home / Self Care | Payer: Medicare Other | Source: Ambulatory Visit | Attending: Family Medicine | Admitting: Family Medicine

## 2022-06-18 DIAGNOSIS — Z780 Asymptomatic menopausal state: Secondary | ICD-10-CM | POA: Diagnosis not present

## 2022-07-04 ENCOUNTER — Telehealth (INDEPENDENT_AMBULATORY_CARE_PROVIDER_SITE_OTHER): Payer: Medicare Other | Admitting: Family Medicine

## 2022-07-04 ENCOUNTER — Telehealth: Payer: Self-pay | Admitting: Family Medicine

## 2022-07-04 ENCOUNTER — Encounter: Payer: Self-pay | Admitting: Family Medicine

## 2022-07-04 DIAGNOSIS — U071 COVID-19: Secondary | ICD-10-CM

## 2022-07-04 MED ORDER — MOLNUPIRAVIR EUA 200MG CAPSULE: 4.0000 | ORAL_CAPSULE | Freq: Two times a day (BID) | ORAL | 0 refills | Status: AC

## 2022-07-04 NOTE — Telephone Encounter (Signed)
FYI - Patient was advised to let us know that she has covid. She took a covid test this morning and it was positive. Started feeling symptoms Monday.

## 2022-07-04 NOTE — Progress Notes (Signed)
Chief Complaint  Patient presents with   Covid Positive    Tested today 07/04/22.    Holly Herring here for URI complaints. Due to COVID-19 pandemic, we are interacting via web portal for an electronic face-to-face visit. I verified patient's ID using 2 identifiers. Patient agreed to proceed with visit via this method. Patient is at home, I am at office. Patient and I are present for visit.   Duration: 2 days  Associated symptoms: sweats, congestion, coughing, fatigue, sneezing, loss of taste.  Denies: sinus pain, rhinorrhea, itchy watery eyes, ear pain, ear drainage, sore throat, wheezing, shortness of breath, myalgia, loss of smell, N/V/D, and fevers Treatment to date: Vick's, Mucinex Sick contacts: Exposed to sick contact at convention 5 d ago Tested + for covid today (07/04/22)  Past Medical History:  Diagnosis Date   Arthritis    Hypertension    Uterine prolapse    had a Gelhorn pessary implant    Objective No conversational dyspnea Age appropriate judgment and insight Nml affect and mood  COVID-19 - Plan: molnupiravir EUA (LAGEVRIO) 200 mg CAPS capsule  Given age, will give antiviral. Starting Robitussin DM, OK to do. She will message if no improvement with cough. Quarantining discussed. Continue to push fluids, practice good hand hygiene, cover mouth when coughing. F/u prn. If starting to experience irreplaceable fluid loss, shaking, or shortness of breath, seek immediate care. Pt voiced understanding and agreement to the plan.  [name] Echo, DO 07/04/22 9:57 AM

## 2022-07-04 NOTE — Telephone Encounter (Signed)
Scheduled today with her

## 2022-07-04 NOTE — Telephone Encounter (Signed)
Nurse Assessment Nurse: Elesa Hacker, RN, Nash Dimmer Date/Time Lamount Cohen Time): 07/04/2022 8:08:24 AM Confirm and document reason for call. If symptomatic, describe symptoms. ---Caller states, went to a convention last weekend. Took test but came out positive this morning, sx started Monday. Very congested. Sweating last night. Does the patient have any new or worsening symptoms? ---Yes Will a triage be completed? ---Yes Related visit to physician within the last 2 weeks? ---No Does the PT have any chronic conditions? (i.e. diabetes, asthma, this includes High risk factors for pregnancy, etc.) ---Yes List chronic conditions. ---HTN Is this a behavioral health or substance abuse call? ---No Guidelines Guideline Title Affirmed Question Affirmed Notes Nurse Date/Time (Eastern Time) COVID-19 - Diagnosed or Suspected [1] HIGH RISK patient (e.g., weak immune system, age > 64 years, obesity with BMI 30 or higher, pregnant, chronic lung disease or other chronic medical condition) AND [2] COVID Deaton, RN, Nash Dimmer 07/04/2022 8:10:52 AM PLEASE NOTE: All timestamps contained within this report are represented as [place] Standard Time. CONFIDENTIALTY NOTICE: This fax transmission is intended only for the addressee. It contains information that is legally privileged, confidential or otherwise protected from use or disclosure. If you are not the intended recipient, you are strictly prohibited from reviewing, disclosing, copying using or disseminating any of this information or taking any action in reliance on or regarding this information. If you have received this fax in error, please notify us immediately by telephone so that we can arrange for its return to [place]. Phone: [phone], Toll-Free: [phone], Fax: [phone] Page: 2 of 3 Call Id: [id number] Guidelines Guideline Title Affirmed Question Affirmed Notes Nurse Date/Time Lamount Cohen Time) symptoms (e.g., cough, fever) (Exceptions: Already seen  by PCP and no new or worsening symptoms.) Disp. Time Lamount Cohen Time) Disposition Final User 07/04/2022 8:18:30 AM Call PCP within 24 Hours Yes Deaton, RN, Nash Dimmer Final Disposition 07/04/2022 8:18:30 AM Call PCP within 24 Hours Yes Deaton, RN, Cory Roughen Disagree/Comply Comply Caller Understands Yes PreDisposition Did not know what to do Care Advice Given Per Guideline CALL PCP WITHIN 24 HOURS: * You need to discuss this with your doctor (or NP/PA) within the next 24 hours. * IF OFFICE WILL BE OPEN: Call the office when it opens tomorrow morning. ALTERNATE DISPOSITION - TELEMEDICINE WITHIN 24 HOURS: * Telemedicine may be your best choice for care during this COVID-19 outbreak. * You should call a telemedicine doctor (or NP/PA) within the next 24 hours, if your own doctor is not available. GENERAL CARE ADVICE FOR COVID-19 SYMPTOMS: * The symptoms are generally treated the same whether you have COVID-19, influenza or some other respiratory virus. * Feeling dehydrated: Drink extra liquids. If the air in your home is dry, use a humidifier. * Cough: Use cough drops. COUGH MEDICINES: * COUGH DROPS: Over-the-counter cough drops can help a lot, especially for mild coughs. They soothe an irritated throat and remove the tickle sensation in the back of the throat. Cough drops are easy to carry with you. * Cough syrup works best for coughs that keep you awake at night. It can also sometimes help in the late stages of a lung or airway infection when the cough is dry and hacking. Cough syrup can be used along with cough drops. * Cough syrups containing the cough suppressant dextromethorphan may help decrease your cough. * Examples: Delsym 12-hour Cough, Robitussin Cough Long-Acting, Triaminic Long-Acting, Vicks DayQuil Cough. COUGH SYRUP WITH DEXTROMETHORPHAN: COUGH SYRUP WITH DEXTROMETHORPHAN - EXTRA NOTES AND WARNINGS: * Do not try to completely stop coughs that produce  mucus and phlegm. * ACETAMINOPHEN -  EXTRA STRENGTH TYLENOL: Take 1,000 mg (two 500 mg pills) every 6 to 8 hours as needed. Each Extra Strength Tylenol pill has 500 mg of acetaminophen. The most you should take is 6 pills a day (3,000 mg total). Note: In [place], the maximum is 8 pills a day (4,000 mg total). * IBUPROFEN (E.G., MOTRIN, ADVIL): Take 400 mg (two 200 mg pills) by mouth every 6 hours. The most you should take is 6 pills a day (1,200 mg total). COVID-19 - HOW TO PROTECT OTHERS - WHEN YOU ARE SICK WITH COVID-19: * STAY HOME A MINIMUM OF 5 DAYS: People with MILD COVID-19 can STOP HOME ISOLATION AFTER 5 DAYS if (1) fever has been gone for 24 hours (without using fever medicine) AND (2) symptoms are better. Continue to wear a well-fitted mask for a full 10 days when around others. * WEAR A MASK FOR 10 DAYS: Wear a wellfitted mask for 10 full days any time you are around others inside your home or in public. Do not go to places where you are unable to wear a mask. CALL BACK IF: * You become worse CARE ADVICE given per COVID-19 - DIAGNOSED OR SUSPECTED (Adult) guideline. PLEASE NOTE: All timestamps contained within this report are represented as [place] Standard Time. CONFIDENTIALTY NOTICE: This fax transmission is intended only for the addressee. It contains information that is legally privileged, confidential or otherwise protected from use or disclosure. If you are not the intended recipient, you are strictly prohibited from reviewing, disclosing, copying using or disseminating any of this information or taking any action in reliance on or regarding this information. If you have received this fax in error, please notify us immediately by telephone so that we can arrange for its return to [place]. Phone: [phone], Toll-Free: [phone], Fax: [phone] Page: 3 of 3 Call Id: [id number] Comments User: Wandra Scot, RN Date/Time Lamount Cohen Time): 07/04/2022 8:15:50 AM O2 sat 97%. User: Wandra Scot, RN Date/Time Lamount Cohen Time):  07/04/2022 8:20:42 AM Caller transferred back to the office for leaving message to talk with MD. Referrals REFERRED TO PCP OFFICE

## 2022-07-04 NOTE — Telephone Encounter (Signed)
Could see virtually at 10 or 1145 if she wishes. Ty.

## 2022-08-06 ENCOUNTER — Telehealth: Payer: Self-pay | Admitting: Family Medicine

## 2022-08-06 NOTE — Telephone Encounter (Signed)
Called and scheduled appt with PCP to discuss

## 2022-08-06 NOTE — Telephone Encounter (Signed)
Patient had covid last month and has been suffering with joint pain ever since. She wanted to know if there is anything she can take because aleve, bio freeze and ice packs are not helping her. Please call to advise.

## 2022-08-07 ENCOUNTER — Encounter: Payer: Self-pay | Admitting: Family Medicine

## 2022-08-07 ENCOUNTER — Ambulatory Visit (INDEPENDENT_AMBULATORY_CARE_PROVIDER_SITE_OTHER): Payer: Medicare Other | Admitting: Family Medicine

## 2022-08-07 VITALS — BP 134/86 | HR 80 | Temp 98.3°F | Ht 61.0 in | Wt 215.1 lb

## 2022-08-07 DIAGNOSIS — M2550 Pain in unspecified joint: Secondary | ICD-10-CM

## 2022-08-07 NOTE — Patient Instructions (Addendum)
Stay hydrated.  OK to take Tylenol 1000 mg (2 extra strength tabs) or 975 mg (3 regular strength tabs) every 6 hours as needed.  Stop the Crestor for 2 weeks and send me a message with how you are doing either way.  Stay active.   Foods that may reduce pain: 1) Ginger 2) Blueberries 3) Salmon 4) Pumpkin seeds 5) dark chocolate 6) turmeric 7) tart cherries 8) virgin olive oil 9) chilli peppers 10) mint 11) krill oil  Let us know if you need anything.  Knee Exercises It is normal to feel mild stretching, pulling, tightness, or discomfort as you do these exercises, but you should stop right away if you feel sudden pain or your pain gets worse.  STRETCHING AND RANGE OF MOTION EXERCISES  These exercises warm up your muscles and joints and improve the movement and flexibility of your knee. These exercises also help to relieve pain, numbness, and tingling. Exercise A: Knee Extension, Prone  Lie on your abdomen on a bed. Place your left / right knee just beyond the edge of the surface so your knee is not on the bed. You can put a towel under your left / right thigh just above your knee for comfort. Relax your leg muscles and allow gravity to straighten your knee. You should feel a stretch behind your left / right knee. Hold this position for 30 seconds. Scoot up so your knee is supported between repetitions. Repeat 2 times. Complete this stretch 3 times per week. Exercise B: Knee Flexion, Active     Lie on your back with both knees straight. If this causes back discomfort, bend your left / right knee so your foot is flat on the floor. Slowly slide your left / right heel back toward your buttocks until you feel a gentle stretch in the front of your knee or thigh. Hold this position for 30 seconds. Slowly slide your left / right heel back to the starting position. Repeat 2 times. Complete this exercise 3 times per week. Exercise C: Quadriceps, Prone     Lie on your abdomen on a  firm surface, such as a bed or padded floor. Bend your left / right knee and hold your ankle. If you cannot reach your ankle or pant leg, loop a belt around your foot and grab the belt instead. Gently pull your heel toward your buttocks. Your knee should not slide out to the side. You should feel a stretch in the front of your thigh and knee. Hold this position for 30 seconds. Repeat 2 times. Complete this stretch 3 times per week. Exercise D: Hamstring, Supine  Lie on your back. Loop a belt or towel over the ball of your left / right foot. The ball of your foot is on the walking surface, right under your toes. Straighten your left / right knee and slowly pull on the belt to raise your leg until you feel a gentle stretch behind your knee. Do not let your left / right knee bend while you do this. Keep your other leg flat on the floor. Hold this position for 30 seconds. Repeat 2 times. Complete this stretch 3 times per week. STRENGTHENING EXERCISES  These exercises build strength and endurance in your knee. Endurance is the ability to use your muscles for a long time, even after they get tired. Exercise E: Quadriceps, Isometric     Lie on your back with your left / right leg extended and your other knee bent. Put  a rolled towel or small pillow under your knee if told by your health care provider. Slowly tense the muscles in the front of your left / right thigh. You should see your kneecap slide up toward your hip or see increased dimpling just above the knee. This motion will push the back of the knee toward the floor. For 3 seconds, keep the muscle as tight as you can without increasing your pain. Relax the muscles slowly and completely. Repeat for 10 total reps Repeat 2 ti mes. Complete this exercise 3 times per week. Exercise F: Straight Leg Raises - Quadriceps  Lie on your back with your left / right leg extended and your other knee bent. Tense the muscles in the front of your left / right  thigh. You should see your kneecap slide up or see increased dimpling just above the knee. Your thigh may even shake a bit. Keep these muscles tight as you raise your leg 4-6 inches (10-15 cm) off the floor. Do not let your knee bend. Hold this position for 3 seconds. Keep these muscles tense as you lower your leg. Relax your muscles slowly and completely after each repetition. 10 total reps. Repeat 2 times. Complete this exercise 3 times per week.  Exercise G: Hamstring Curls     If told by your health care provider, do this exercise while wearing ankle weights. Begin with 5 lb weights (optional). Then increase the weight by 1 lb (0.5 kg) increments. Do not wear ankle weights that are more than 20 lbs to start with. Lie on your abdomen with your legs straight. Bend your left / right knee as far as you can without feeling pain. Keep your hips flat against the floor. Hold this position for 3 seconds. Slowly lower your leg to the starting position. Repeat for 10 reps.  Repeat 2 times. Complete this exercise 3 times per week. Exercise H: Squats (Quadriceps)  Stand in front of a table, with your feet and knees pointing straight ahead. You may rest your hands on the table for balance but not for support. Slowly bend your knees and lower your hips like you are going to sit in a chair. Keep your weight over your heels, not over your toes. Keep your lower legs upright so they are parallel with the table legs. Do not let your hips go lower than your knees. Do not bend lower than told by your health care provider. If your knee pain increases, do not bend as low. Hold the squat position for 1 second. Slowly push with your legs to return to standing. Do not use your hands to pull yourself to standing. Repeat 2 times. Complete this exercise 3 times per week. Exercise I: Wall Slides (Quadriceps)     Lean your back against a smooth wall or door while you walk your feet out 18-24 inches (46-61 cm) from  it. Place your feet hip-width apart. Slowly slide down the wall or door until your knees Repeat 2 times. Complete this exercise every other day. Exercise K: Straight Leg Raises - Hip Abductors  Lie on your side with your left / right leg in the top position. Lie so your head, shoulder, knee, and hip line up. You may bend your bottom knee to help you keep your balance. Roll your hips slightly forward so your hips are stacked directly over each other and your left / right knee is facing forward. Leading with your heel, lift your top leg 4-6 inches (10-15 cm).  You should feel the muscles in your outer hip lifting. Do not let your foot drift forward. Do not let your knee roll toward the ceiling. Hold this position for 3 seconds. Slowly return your leg to the starting position. Let your muscles relax completely after each repetition. 10 total reps. Repeat 2 times. Complete this exercise 3 times per week. Exercise J: Straight Leg Raises - Hip Extensors  Lie on your abdomen on a firm surface. You can put a pillow under your hips if that is more comfortable. Tense the muscles in your buttocks and lift your left / right leg about 4-6 inches (10-15 cm). Keep your knee straight as you lift your leg. Hold this position for 3 seconds. Slowly lower your leg to the starting position. Let your leg relax completely after each repetition. Repeat 2 times. Complete this exercise 3 times per week. Document Released: 09/26/2005 Document Revised: 08/06/2016 Document Reviewed: 09/18/2015 Elsevier Interactive Patient Education  2017 [employer].

## 2022-08-07 NOTE — Progress Notes (Signed)
Chief Complaint  Patient presents with   Follow-up    On going joint pain since having Covid. Had Covid 07/02/22. Still very fatigued Slight headache in the mornings    Subjective: Patient is a 71 y.o. female here for f/u covid.  Dx'd w covid around 1 mo ago.  Since that time, her cough is resolved but she still has hoarseness.  She has pain in both of her knees which were both replaced previously.  Denies any injury or change in activity that could have triggered this.  She does not have any fevers, redness, or swelling.  She is try to cycle and take Tylenol with some relief.  She still has fatigue but is trying to improve this as well.  She sleeps 3-5 hours nightly since this happened due to the pain.  Past Medical History:  Diagnosis Date   Arthritis    Hypertension    Uterine prolapse    had a Gelhorn pessary implant    Objective: BP 134/86   Pulse 80   Temp 98.3 F (36.8 C) (Oral)   Ht 5\' 1"  (1.549 m)   Wt 215 lb 2 oz (97.6 kg)   SpO2 98%   BMI 40.65 kg/m  General: Awake, appears stated age Heart: RRR, no LE edema Lungs: CTAB, no rales, wheezes or rhonchi. No accessory muscle use MSK: TTP over the anterior joint lines of the knee laterally, no effusion or edema Psych: Age appropriate judgment and insight, normal affect and mood  Assessment and Plan: Arthralgia, unspecified joint  Stay hydrated.  Keep pushing herself from an energy standpoint.  I think she sleeps better this will be improved well.  Tylenol, ibuprofen, stretches and exercises for the knees.  If no improvement, will refer to physical therapy.  If we can fix the pain, she will sleep better and thus other issues will resolve.  Recommended Flonase for her hoarseness. The patient voiced understanding and agreement to the plan.  [place], DO 08/07/22  11:05 AM

## 2022-08-22 ENCOUNTER — Ambulatory Visit: Payer: Medicare Other | Admitting: Obstetrics & Gynecology

## 2022-08-22 ENCOUNTER — Other Ambulatory Visit (HOSPITAL_COMMUNITY)
Admission: RE | Admit: 2022-08-22 | Discharge: 2022-08-22 | Disposition: A | Discharge status: Home / Self Care | Payer: Medicare Other | Source: Ambulatory Visit | Attending: Obstetrics & Gynecology | Admitting: Obstetrics & Gynecology

## 2022-08-22 ENCOUNTER — Encounter: Payer: Self-pay | Admitting: Obstetrics & Gynecology

## 2022-08-22 VITALS — BP 172/74 | HR 87 | Resp 16 | Ht 62.0 in | Wt 218.0 lb

## 2022-08-22 DIAGNOSIS — N898 Other specified noninflammatory disorders of vagina: Secondary | ICD-10-CM | POA: Diagnosis not present

## 2022-08-22 NOTE — Progress Notes (Signed)
History:  71 y.o. [id number] here today for pessary removal and replacement after cleaning. Pt denies any  current problems with pessary. She reports that she has not been using her vaginal cream and she has had some dryness.   She reports that she was recently at a convention and contracted covid. She siad it took 2 months for her to feel back to normal but, she is feeling ok now. Her main sx were a ST and lots of fatigue. She also had a mild cough.       The following portions of the patient's history were reviewed and updated as appropriate: allergies, current medications, past family history, past medical history, past social history, past surgical history and problem list.   Review of Systems:  Pertinent items are noted in HPI.   BP (!) 172/74   Pulse 87   Resp 16   Ht 5\' 2"  (1.575 m)   Wt 218 lb (98.9 kg)   BMI 39.87 kg/m   CONSTITUTIONAL: Well-developed, well-nourished female in no acute distress.  HENT:  Normocephalic, atraumatic EYES: Conjunctivae and EOM are normal. No scleral icterus.  NECK: Normal range of motion SKIN: Skin is warm and dry. No rash noted. Not diaphoretic.No pallor. Reader: Alert and oriented to person, place, and time. Normal coordination.  GYN: Pessary removed and cleaned; vaginal mucosa looks healty and with no excoriations or breakdown. The cervix is without lesions. The bladder is prolapsed to the introitus without valsalva.        Assessment & Plan:  Pessary maintenance: F/u in 3 months to have pessary removed and cleaned F/u sooner prn    Total face-to-face time with patient, review of chart, discussion with consultant and coordination of care was 64min.     Favian Kittleson L. Harraway-Smith, M.D., Cherlynn June

## 2022-08-24 LAB — CERVICOVAGINAL ANCILLARY ONLY
Candida Glabrata: NEGATIVE
Candida Vaginitis: NEGATIVE
Comment: NEGATIVE
Comment: NEGATIVE

## 2022-08-28 ENCOUNTER — Telehealth: Payer: Self-pay

## 2022-08-28 NOTE — Telephone Encounter (Signed)
Left message for patient to return call to office for results.  No yeast on vaginal culture. Kathrene Alu RN

## 2022-09-21 ENCOUNTER — Encounter: Payer: Self-pay | Admitting: Family Medicine

## 2022-09-21 ENCOUNTER — Ambulatory Visit (INDEPENDENT_AMBULATORY_CARE_PROVIDER_SITE_OTHER): Payer: Medicare Other | Admitting: Family Medicine

## 2022-09-21 VITALS — BP 138/82 | HR 49 | Temp 97.0°F | Ht 61.0 in | Wt 216.1 lb

## 2022-09-21 DIAGNOSIS — Z0000 Encounter for general adult medical examination without abnormal findings: Secondary | ICD-10-CM

## 2022-09-21 DIAGNOSIS — E7800 Pure hypercholesterolemia, unspecified: Secondary | ICD-10-CM | POA: Diagnosis not present

## 2022-09-21 LAB — LIPID PANEL
Cholesterol: 164 mg/dL (ref 0–200)
HDL: 101.8 mg/dL (ref >39.00)
LDL Cholesterol: 54 mg/dL (ref 0–99)
NonHDL: 61.9
Total CHOL/HDL Ratio: 2
Triglycerides: 40 mg/dL (ref 0.0–149.0)
VLDL: 8 mg/dL (ref 0.0–40.0)

## 2022-09-21 LAB — COMPREHENSIVE METABOLIC PANEL
ALT: 17 U/L (ref 0–35)
AST: 22 U/L (ref 0–37)
Albumin: 4.2 g/dL (ref 3.5–5.2)
Alkaline Phosphatase: 42 U/L (ref 39–117)
BUN: 16 mg/dL (ref 6–23)
CO2: 29 mEq/L (ref 19–32)
Calcium: 9.9 mg/dL (ref 8.4–10.5)
Chloride: 102 mEq/L (ref 96–112)
Creatinine, Ser: 0.77 mg/dL (ref 0.40–1.20)
GFR: 78.53 mL/min (ref >60.00)
Glucose, Bld: 93 mg/dL (ref 70–99)
Potassium: 4.1 mEq/L (ref 3.5–5.1)
Sodium: 138 mEq/L (ref 135–145)
Total Bilirubin: 0.4 mg/dL (ref 0.2–1.2)
Total Protein: 7.3 g/dL (ref 6.0–8.3)

## 2022-09-21 MED ORDER — ROSUVASTATIN CALCIUM 20 MG PO TABS: 20.0000 mg | ORAL_TABLET | Freq: Every day | ORAL | 3 refills | Status: DC

## 2022-09-21 MED ORDER — LISINOPRIL-HYDROCHLOROTHIAZIDE 10-12.5 MG PO TABS: 1.0000 | ORAL_TABLET | Freq: Every day | ORAL | 3 refills | Status: DC

## 2022-09-21 NOTE — Patient Instructions (Addendum)
Keep the diet clean and stay active.  Let us know if you need anything.  Please get your 2nd shingles vaccine at your convenience.   Let us know if you need anything.

## 2022-09-21 NOTE — Progress Notes (Signed)
Chief Complaint  Patient presents with   Annual Exam     Well Woman Holly Herring is here for a complete physical.   Her last physical was >1 year ago.  Current diet: in general, a "healthy" diet. Current exercise: lifting wts, walking. Weight is stable and she denies daytime fatigue. Seatbelt? Yes Advanced directive? Yes  Health Maintenance Colonoscopy- Yes Shingrix- Yes DEXA- Yes Mammogram- Yes Tetanus- Yes Pneumonia- Yes Hep C screen- Yes  Past Medical History:  Diagnosis Date   Arthritis    Hypertension    Uterine prolapse    had a Gelhorn pessary implant     Past Surgical History:  Procedure Laterality Date   ABDOMINAL HYSTERECTOMY  2019   donut     Gelhorn  pessary for uterine prolapse   HERNIA REPAIR     KNEE ARTHROSCOPY     Dr Hollice Gong   KNEE CLOSED REDUCTION Right 03/20/2021   Procedure: CLOSED MANIPULATION KNEE;  Surgeon: Ollen Gross, MD;  Location: [facility];  Service: Orthopedics;  Laterality: Right;    OOPHORECTOMY     one ovary removed due to ectopic   REDUCTION MAMMAPLASTY Bilateral 2002   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 Swallow whole.     calcium carbonate (OS-CAL) 600 MG TABS tablet Take 600 mg by mouth daily with breakfast.     diphenhydrAMINE (SLEEP AID, DIPHENHYDRAMINE,) 25 MG tablet Take 50 mg by mouth at bedtime.     estradiol (ESTRACE) 0.1 MG/GM vaginal cream Place 1 Applicatorful vaginally See admin instructions. Every 2 days     estradiol (ESTRACE) 1 MG  tablet Take 1 tablet (1 mg total) by mouth daily. 90 tablet 3   lisinopril-hydrochlorothiazide (ZESTORETIC) 10-12.5 MG tablet Take 1 tablet by mouth once daily 90 tablet 0   Magnesium 500 MG CAPS Take 500 mg by mouth daily.     Omega-3 Fatty Acids (FISH OIL) 500 MG CAPS Take 500 mg by mouth daily.     OVER THE COUNTER MEDICATION Take 1 capsule by mouth in the morning and at bedtime. Golo Release     POTASSIUM GLUCONATE PO Take 500 mg by mouth daily.     rosuvastatin (CRESTOR) 20 MG tablet Take 1 tablet (20 mg total) by mouth daily. 90 tablet 3   vitamin B-12 (CYANOCOBALAMIN) 1000 MCG tablet Take 1,000 mcg by mouth daily.     vitamin C (ASCORBIC ACID) 500 MG tablet Take 500 mg by mouth daily.     Zinc 50 MG CAPS Take 50 mg by mouth daily.      Allergies Allergies  Allergen Reactions   Codeine Itching    Can't sleep    Other     NO BLOOD OR BLOOD PRODUCTS   Penicillins Hives    Tolerated Cephalosporin Date: 01/10/21.     Percocet [Oxycodone-Acetaminophen] Itching    Has to take benadryl prior    Review of Systems: Constitutional:  no fevers Eye:  no recent significant change in vision Ears:  No  changes in hearing Nose/Mouth/Throat:  no complaints of nasal congestion, no sore throat Cardiovascular: no chest pain Respiratory:  No shortness of breath Gastrointestinal:  No change in bowel habits GU:  Female: negative for dysuria Integumentary:  no abnormal skin lesions reported Neurologic:  no headaches Endocrine:  denies unexplained weight changes  Exam BP 138/82 (BP Location: Left Arm, Patient Position: Sitting, Cuff Size: Normal)   Pulse (!) 49   Temp (!) 97 F (36.1 C) (Oral)   Ht 5\' 1"  (1.549 m)   Wt 216 lb 2 oz (98 kg)   SpO2 98%   BMI 40.84 kg/m  General:  well developed, well nourished, in no apparent distress Skin:  no significant moles, warts, or growths Head:  no masses, lesions, or tenderness Eyes:  pupils equal and round, sclera anicteric without  injection Ears:  canals without lesions, TMs shiny without retraction, no obvious effusion, no erythema Nose:  nares patent, mucosa normal, and no drainage Throat/Pharynx:  lips and gingiva without lesion; tongue and uvula midline; non-inflamed pharynx; no exudates or postnasal drainage Neck: neck supple without adenopathy, thyromegaly, or masses Lungs:  clear to auscultation, breath sounds equal bilaterally, no respiratory distress Cardio:  regular rate and rhythm, no bruits or LE edema Abdomen:  abdomen soft, nontender; bowel sounds normal; no masses or organomegaly Genital: Deferred Neuro:  gait normal; deep tendon reflexes normal and symmetric Psych: well oriented with normal range of affect and appropriate judgment/insight  Assessment and Plan  Well adult exam  Pure hypercholesterolemia - Plan: Comprehensive metabolic panel, Lipid panel   Well 71 y.o. female. Counseled on diet and exercise. Other orders as above. Follow up in 6 mo. The patient voiced understanding and agreement to the plan.  [place], DO 09/21/22 8:51 AM

## 2022-10-17 ENCOUNTER — Telehealth: Payer: Self-pay | Admitting: Family Medicine

## 2022-10-17 NOTE — Telephone Encounter (Signed)
Patient dropped of form to be filled out Patient would like to be called when it is ready for pick up

## 2022-10-17 NOTE — Telephone Encounter (Signed)
Form completed and pt called

## 2022-11-12 ENCOUNTER — Other Ambulatory Visit: Payer: Self-pay | Admitting: Family Medicine

## 2022-11-12 DIAGNOSIS — N952 Postmenopausal atrophic vaginitis: Secondary | ICD-10-CM

## 2023-02-07 ENCOUNTER — Other Ambulatory Visit: Payer: Self-pay | Admitting: Family Medicine

## 2023-02-07 DIAGNOSIS — N952 Postmenopausal atrophic vaginitis: Secondary | ICD-10-CM

## 2023-03-25 ENCOUNTER — Ambulatory Visit (INDEPENDENT_AMBULATORY_CARE_PROVIDER_SITE_OTHER): Payer: Medicare Other | Admitting: Family Medicine

## 2023-03-25 ENCOUNTER — Encounter: Payer: Self-pay | Admitting: Family Medicine

## 2023-03-25 VITALS — BP 130/80 | HR 93 | Temp 98.6°F | Ht 61.0 in | Wt 220.4 lb

## 2023-03-25 DIAGNOSIS — I10 Essential (primary) hypertension: Secondary | ICD-10-CM | POA: Diagnosis not present

## 2023-03-25 DIAGNOSIS — R7303 Prediabetes: Secondary | ICD-10-CM

## 2023-03-25 DIAGNOSIS — E7800 Pure hypercholesterolemia, unspecified: Secondary | ICD-10-CM

## 2023-03-25 LAB — COMPREHENSIVE METABOLIC PANEL
ALT: 13 U/L (ref 0–35)
AST: 20 U/L (ref 0–37)
Albumin: 4.2 g/dL (ref 3.5–5.2)
Alkaline Phosphatase: 42 U/L (ref 39–117)
BUN: 19 mg/dL (ref 6–23)
CO2: 29 mEq/L (ref 19–32)
Calcium: 10.1 mg/dL (ref 8.4–10.5)
Chloride: 102 mEq/L (ref 96–112)
Creatinine, Ser: 0.83 mg/dL (ref 0.40–1.20)
GFR: 71.52 mL/min (ref >60.00)
Glucose, Bld: 87 mg/dL (ref 70–99)
Potassium: 4.4 mEq/L (ref 3.5–5.1)
Sodium: 139 mEq/L (ref 135–145)
Total Bilirubin: 0.4 mg/dL (ref 0.2–1.2)
Total Protein: 7.4 g/dL (ref 6.0–8.3)

## 2023-03-25 LAB — HEMOGLOBIN A1C: Hgb A1c MFr Bld: 6.2 % (ref 4.6–6.5)

## 2023-03-25 LAB — LIPID PANEL
Cholesterol: 174 mg/dL (ref 0–200)
HDL: 100.5 mg/dL (ref >39.00)
LDL Cholesterol: 63 mg/dL (ref 0–99)
NonHDL: 73.06
Total CHOL/HDL Ratio: 2
Triglycerides: 51 mg/dL (ref 0.0–149.0)
VLDL: 10.2 mg/dL (ref 0.0–40.0)

## 2023-03-25 NOTE — Patient Instructions (Signed)
Give us 2-3 business days to get the results of your labs back.   Keep the diet clean and stay active.  Let us know if you need anything. 

## 2023-03-25 NOTE — Progress Notes (Signed)
Chief Complaint  Patient presents with   Follow-up    6 month    Subjective Holly Herring is a 72 y.o. female who presents for hypertension follow up. She does not monitor home blood pressures. She is compliant with medications- Zestoretic 10-12.5 mg/d. Patient has these side effects of medication: none She is not always adhering to a healthy diet overall. Current exercise: walking No CP or SOB.   Hyperlipidemia Patient presents for hyperlipidemia follow up. Currently being treated with Crestor 20 mg/d and compliance with treatment thus far has been good. She denies myalgias. Diet/exercise as above.  The patient is known to have coexisting coronary artery disease.   Past Medical History:  Diagnosis Date   Arthritis    Hypertension    Uterine prolapse    had a Gelhorn pessary implant    Exam BP 130/80 (BP Location: Left Arm, Patient Position: Sitting, Cuff Size: Large)   Pulse 93   Temp 98.6 F (37 C) (Oral)   Ht 5\' 1"  (1.549 m)   Wt 220 lb 6 oz (100 kg)   SpO2 98%   BMI 41.64 kg/m  General:  well developed, well nourished, in no apparent distress Heart: RRR, no bruits, no LE edema Lungs: clear to auscultation, no accessory muscle use Psych: well oriented with normal range of affect and appropriate judgment/insight  Essential hypertension  Pure hypercholesterolemia - Plan: Comprehensive metabolic panel, Lipid panel  Prediabetes - Plan: Hemoglobin A1c  Chronic, stable. Cont Zestoretic 10-12.5 mg/d. Counseled on diet and exercise. Chronic, stable. Cont Crestor 20 mg/d.  F/u in 6 mo. The patient voiced understanding and agreement to the plan.  [name] [place], DO 03/25/23  8:42 AM

## 2023-04-08 ENCOUNTER — Other Ambulatory Visit (HOSPITAL_BASED_OUTPATIENT_CLINIC_OR_DEPARTMENT_OTHER): Payer: Self-pay | Admitting: Family Medicine

## 2023-04-08 DIAGNOSIS — Z1231 Encounter for screening mammogram for malignant neoplasm of breast: Secondary | ICD-10-CM

## 2023-05-05 ENCOUNTER — Other Ambulatory Visit: Payer: Self-pay | Admitting: Family Medicine

## 2023-05-05 DIAGNOSIS — N952 Postmenopausal atrophic vaginitis: Secondary | ICD-10-CM

## 2023-05-27 ENCOUNTER — Encounter (HOSPITAL_BASED_OUTPATIENT_CLINIC_OR_DEPARTMENT_OTHER): Payer: Self-pay

## 2023-05-27 ENCOUNTER — Ambulatory Visit (HOSPITAL_BASED_OUTPATIENT_CLINIC_OR_DEPARTMENT_OTHER)
Admission: RE | Admit: 2023-05-27 | Discharge: 2023-05-27 | Disposition: A | Discharge status: Home / Self Care | Payer: Medicare Other | Source: Ambulatory Visit | Attending: Family Medicine | Admitting: Family Medicine

## 2023-05-27 DIAGNOSIS — Z1231 Encounter for screening mammogram for malignant neoplasm of breast: Secondary | ICD-10-CM | POA: Diagnosis not present

## 2023-06-11 ENCOUNTER — Ambulatory Visit: Payer: Medicare Other | Admitting: Family Medicine

## 2023-06-11 ENCOUNTER — Encounter: Payer: Self-pay | Admitting: Family Medicine

## 2023-06-11 VITALS — BP 138/78 | HR 87 | Temp 98.4°F | Resp 18 | Ht 61.0 in | Wt 219.2 lb

## 2023-06-11 DIAGNOSIS — S139XXA Sprain of joints and ligaments of unspecified parts of neck, initial encounter: Secondary | ICD-10-CM

## 2023-06-11 MED ORDER — PREDNISONE 10 MG PO TABS
ORAL_TABLET | ORAL | 0 refills | Status: DC
Start: 2023-06-11 — End: 2023-10-01

## 2023-06-11 MED ORDER — CYCLOBENZAPRINE HCL 10 MG PO TABS
10.0000 mg | ORAL_TABLET | Freq: Three times a day (TID) | ORAL | 0 refills | Status: DC | PRN
Start: 2023-06-11 — End: 2023-10-01

## 2023-06-11 NOTE — Progress Notes (Signed)
Established Patient Office Visit  Subjective   Patient ID: Holly Herring, female    DOB: Sep 15, 1951  Age: 72 y.o. MRN: [id number]  Chief Complaint  Patient presents with   Neck Pain    Left side of the neck, pt states having pain and burning. No falls or injuries and no heavy lifting    HPI Discussed the use of AI scribe software for clinical note transcription with the patient, who gave verbal consent to proceed.  History of Present Illness   The patient, a retired individual with arthritis, presents with skin soreness and muscle pain that started last week. The discomfort is localized to the upper back and shoulder area, and is described as a burning sensation, particularly when clothes touch the skin. The patient denies any visible skin changes. The pain is also present when pressure is applied to the area, suggesting muscle involvement. The patient has tried various home remedies, including changing pillows, applying alcohol, and using a muscle relaxer cream, but none have provided relief. The patient takes Aleve daily for arthritis, but it has not alleviated this new discomfort. The patient has been active, working in the yard, which may have contributed to the muscle pain.      Patient Active Problem List   Diagnosis Date Noted   Pure hypercholesterolemia 09/20/2021   Arthrofibrosis of total knee replacement (HCC) 01/09/2021   Preventive measure 01/25/2016   Numbness and tingling in hands 01/18/2015   Former smoker, stopped smoking in distant past 01/18/2015   Essential hypertension 12/26/2011   Postmenopausal status, on HRT 12/26/2011   Knee pain, s/p TKR LEFT 12/26/2011   Past Medical History:  Diagnosis Date   Arthritis    Hypertension    Uterine prolapse    had a Gelhorn pessary implant   Past Surgical History:  Procedure Laterality Date   ABDOMINAL HYSTERECTOMY  2019   donut     Gelhorn  pessary for uterine prolapse   HERNIA REPAIR     KNEE ARTHROSCOPY     Dr  Hollice Gong   KNEE CLOSED REDUCTION Right 03/20/2021   Procedure: CLOSED MANIPULATION KNEE;  Surgeon: Ollen Gross, MD;  Location: [facility];  Service: Orthopedics;  Laterality: Right;    OOPHORECTOMY     one ovary removed due to ectopic   REDUCTION MAMMAPLASTY Bilateral 2002   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   Procedure: TOTAL KNEE ARTHROPLASTY;  Surgeon: Nestor Lewandowsky, MD;  Location: [facility];  Service: Orthopedics;  Laterality: Left;  DEPUY/SIGMA   TOTAL KNEE ARTHROPLASTY Right 01/09/2021   Procedure: TOTAL KNEE ARTHROPLASTY;  Surgeon: Ollen Gross, MD;  Location: [facility];  Service: Orthopedics;  Laterality: Right;    Social History   Tobacco Use   Smoking status: Former    Current packs/day: 0.00    Types: Cigarettes    Quit date: 11/26/1978    Years since quitting: 44.5   Smokeless tobacco: Never  Vaping Use   Vaping status: Never Used  Substance Use Topics   Alcohol use: Yes    Alcohol/week: 1.0 standard drink of alcohol    Types: 1 Glasses of wine per week    Comment: occasionally   Drug use: No   Social History   Socioeconomic History   Marital status: Widowed    Spouse name: Not on file   Number of children: Not on file   Years of education: Not on file   Highest education level: Some college, no degree  Occupational  History   Not on file  Tobacco Use   Smoking status: Former    Current packs/day: 0.00    Types: Cigarettes    Quit date: 11/26/1978    Years since quitting: 44.5   Smokeless tobacco: Never  Vaping Use   Vaping status: Never Used  Substance and Sexual Activity   Alcohol use: Yes    Alcohol/week: 1.0 standard drink of alcohol    Types: 1 Glasses of wine per week    Comment: occasionally   Drug use: No   Sexual activity: Not on file  Other Topics Concern   Not on file  Social History Narrative   Not on file   Social Determinants of Health   Financial Resource Strain: Low Risk  (03/18/2023)   Overall Financial Resource Strain  (CARDIA)    Difficulty of Paying Living Expenses: Not hard at all  Food Insecurity: No Food Insecurity (03/18/2023)   Hunger Vital Sign    Worried About Running Out of Food in the Last Year: Never true    Ran Out of Food in the Last Year: Never true  Transportation Needs: No Transportation Needs (03/18/2023)   PRAPARE - Administrator, Civil Service (Medical): No    Lack of Transportation (Non-Medical): No  Physical Activity: Sufficiently Active (03/18/2023)   Exercise Vital Sign    Days of Exercise per Week: 6 days    Minutes of Exercise per Session: 110 min  Stress: No Stress Concern Present (03/18/2023)   [employer] of Occupational Health - Occupational Stress Questionnaire    Feeling of Stress : Not at all  Social Connections: Moderately Integrated (03/18/2023)   Social Connection and Isolation Panel [NHANES]    Frequency of Communication with Friends and Family: More than three times a week    Frequency of Social Gatherings with Friends and Family: More than three times a week    Attends Religious Services: More than 4 times per year    Active Member of [employer] or Organizations: Yes    Attends Banker Meetings: More than 4 times per year    Marital Status: Widowed  Intimate Partner Violence: Not At Risk (06/04/2022)   Humiliation, Afraid, Rape, and Kick questionnaire    Fear of Current or Ex-Partner: No    Emotionally Abused: No    Physically Abused: No    Sexually Abused: No   Family Status  Relation Name Status   Mother  Deceased   Brother  Deceased   Brother  Alive   Father  Deceased  No partnership data on file   Family History  Problem Relation Age of Onset   Heart disease Mother    Heart disease Brother    Allergies  Allergen Reactions   Codeine Itching    Can't sleep    Other     NO BLOOD OR BLOOD PRODUCTS   Penicillins Hives    Tolerated Cephalosporin Date: 01/10/21.     Percocet [Oxycodone-Acetaminophen] Itching    Has to  take benadryl prior      ROS    Objective:     BP 138/78 (BP Location: Right Arm, Patient Position: Sitting, Cuff Size: Large)   Pulse 87   Temp 98.4 F (36.9 C) (Oral)   Resp 18   Ht 5\' 1"  (1.549 m)   Wt 219 lb 3.2 oz (99.4 kg)   SpO2 97%   BMI 41.42 kg/m  BP Readings from Last 3 Encounters:  06/11/23 138/78  03/25/23  130/80  09/21/22 138/82   Wt Readings from Last 3 Encounters:  06/11/23 219 lb 3.2 oz (99.4 kg)  03/25/23 220 lb 6 oz (100 kg)  09/21/22 216 lb 2 oz (98 kg)   SpO2 Readings from Last 3 Encounters:  06/11/23 97%  03/25/23 98%  09/21/22 98%      Physical Exam Vitals and nursing note reviewed.  Constitutional:      General: She is not in acute distress.    Appearance: Normal appearance. She is well-developed.  HENT:     Head: Normocephalic and atraumatic.  Eyes:     General: No scleral icterus.       Right eye: No discharge.        Left eye: No discharge.  Cardiovascular:     Rate and Rhythm: Normal rate and regular rhythm.     Heart sounds: No murmur heard. Pulmonary:     Effort: Pulmonary effort is normal. No respiratory distress.     Breath sounds: Normal breath sounds.  Musculoskeletal:        General: Swelling and tenderness present. Normal range of motion.       Arms:     Cervical back: Normal range of motion and neck supple.     Right lower leg: No edema.     Left lower leg: No edema.     Comments: Muscle spasms, tenderness L side neck  Normal rom   Skin:    General: Skin is warm and dry.  Neurological:     Mental Status: She is alert and oriented to person, place, and time.  Psychiatric:        Mood and Affect: Mood normal.        Behavior: Behavior normal.        Thought Content: Thought content normal.        Judgment: Judgment normal.     No results found for any visits on 06/11/23.  Last CBC Lab Results  Component Value Date   WBC 6.0 03/20/2021   HGB 12.3 03/20/2021   HCT 39.6 03/20/2021   MCV 89.4 03/20/2021    MCH 27.8 03/20/2021   RDW 14.3 03/20/2021   PLT 355 03/20/2021   Last metabolic panel Lab Results  Component Value Date   GLUCOSE 87 03/25/2023   NA 139 03/25/2023   K 4.4 03/25/2023   CL 102 03/25/2023   CO2 29 03/25/2023   BUN 19 03/25/2023   CREATININE 0.83 03/25/2023   GFR 71.52 03/25/2023   CALCIUM 10.1 03/25/2023   PROT 7.4 03/25/2023   ALBUMIN 4.2 03/25/2023   BILITOT 0.4 03/25/2023   ALKPHOS 42 03/25/2023   AST 20 03/25/2023   ALT 13 03/25/2023   ANIONGAP 11 03/20/2021   Last lipids Lab Results  Component Value Date   CHOL 174 03/25/2023   HDL 100.50 03/25/2023   LDLCALC 63 03/25/2023   LDLDIRECT 108 01/25/2016   TRIG 51.0 03/25/2023   CHOLHDL 2 03/25/2023   Last hemoglobin A1c Lab Results  Component Value Date   HGBA1C 6.2 03/25/2023   Last thyroid functions Lab Results  Component Value Date   TSH 2.582 01/17/2015   Last vitamin D No results found for: "25OHVITD2", "25OHVITD3", "VD25OH" Last vitamin B12 and Folate No results found for: "VITAMINB12", "FOLATE"    The ASCVD Risk score (Arnett DK, et al., 2019) failed to calculate for the following reasons:   The valid HDL cholesterol range is 20 to 100 mg/dL    Assessment & Plan:  Problem List Items Addressed This Visit   None Visit Diagnoses     Neck sprain, initial encounter    -  Primary   Relevant Medications   cyclobenzaprine (FLEXERIL) 10 MG tablet   predniSONE (DELTASONE) 10 MG tablet     Assessment and Plan    Musculoskeletal Pain: Localized pain and tenderness in the neck and shoulder region, likely due to muscle strain or tension. No signs of rash or nerve impingement. Patient has been using over-the-counter remedies with no relief. -Start a muscle relaxer at night for symptomatic relief. -Consider a short course of Prednisone to reduce inflammation. -Recommend at-home exercises for stretching and self-massage with a tennis ball or rolling pin. -Consider seeking professional  deep tissue massage. -Check in 2 weeks or sooner if symptoms worsen.  Arthritis: Daily Aleve for management. -Continue Aleve, but hold while on Prednisone.        No follow-ups on file.    Donato Schultz, DO

## 2023-06-13 ENCOUNTER — Ambulatory Visit: Payer: Medicare Other | Admitting: Emergency Medicine

## 2023-06-13 VITALS — Ht 61.0 in | Wt 209.0 lb

## 2023-06-13 DIAGNOSIS — Z0000 Encounter for general adult medical examination without abnormal findings: Secondary | ICD-10-CM | POA: Diagnosis not present

## 2023-06-13 NOTE — Progress Notes (Signed)
Subjective:   Per patient no change in vitals since last visit, unable to obtain new vitals due to telehealth visit   Holly Herring is a 72 y.o. female who presents for Medicare Annual (Subsequent) preventive examination.  Visit Complete: Virtual  I connected with  Holly Herring on 06/13/23 by a audio enabled telemedicine application and verified that I am speaking with the correct person using two identifiers.  Patient Location: Home  Provider Location: Home Office  I discussed the limitations of evaluation and management by telemedicine. The patient expressed understanding and agreed to proceed.  Patient Medicare AWV questionnaire was completed by the patient on 06/06/23; I have confirmed that all information answered by patient is correct and no changes since this date.  Review of Systems     Cardiac Risk Factors include: advanced age (>25men, >73 women);dyslipidemia;hypertension;obesity (BMI >30kg/m2)     Objective:    Today's Vitals   06/06/23 0941 06/13/23 1558  Weight:  209 lb (94.8 kg)  Height:  5\' 1"  (1.549 m)  PainSc: 5     Body mass index is 39.49 kg/m.     06/13/2023    4:15 PM 06/04/2022   11:38 AM 05/09/2021    8:40 AM 03/15/2021    1:31 PM 01/09/2021   12:02 PM 01/03/2021    9:22 AM 03/03/2020   10:35 AM  Advanced Directives  Does Patient Have a Medical Advance Directive? No Yes No Yes No No Yes  Type of Furniture conservator/restorer;Living will  Healthcare Power of [place];Living will   Healthcare Power of [place];Living will  Does patient want to make changes to medical advance directive?       No - Patient declined  Copy of Healthcare Power of Attorney in Chart?  Yes - validated most recent copy scanned in chart (See row information)  Yes - validated most recent copy scanned in chart (See row information)   No - copy requested  Would patient like information on creating a medical advance directive? Yes (MAU/Ambulatory/Procedural  Areas - Information given)  No - Patient declined  No - Patient declined No - Patient declined     Current Medications (verified) Outpatient Encounter Medications as of 06/13/2023  Medication Sig   aspirin EC 81 MG tablet Take 81 mg by mouth daily. Swallow whole.   calcium carbonate (OS-CAL) 600 MG TABS tablet Take 600 mg by mouth daily with breakfast.   Cod Liver Oil CAPS Take 1 capsule by mouth daily.   cyclobenzaprine (FLEXERIL) 10 MG tablet Take 1 tablet (10 mg total) by mouth 3 (three) times daily as needed for muscle spasms.   diphenhydrAMINE (SLEEP AID, DIPHENHYDRAMINE,) 25 MG tablet Take 50 mg by mouth at bedtime.   estradiol (ESTRACE) 0.1 MG/GM vaginal cream Place 1 Applicatorful vaginally See admin instructions. Every 2 days   estradiol (ESTRACE) 1 MG tablet Take 1 tablet by mouth once daily   lisinopril-hydrochlorothiazide (ZESTORETIC) 10-12.5 MG tablet Take 1 tablet by mouth daily.   Magnesium 500 MG CAPS Take 500 mg by mouth daily.   POTASSIUM GLUCONATE PO Take 500 mg by mouth daily.   predniSONE (DELTASONE) 10 MG tablet TAKE 3 TABLETS PO QD FOR 3 DAYS THEN TAKE 2 TABLETS PO QD FOR 3 DAYS THEN TAKE 1 TABLET PO QD FOR 3 DAYS THEN TAKE 1/2 TAB PO QD FOR 3 DAYS   rosuvastatin (CRESTOR) 20 MG tablet Take 1 tablet (20 mg total) by mouth daily.   vitamin  B-12 (CYANOCOBALAMIN) 1000 MCG tablet Take 1,000 mcg by mouth daily.   vitamin C (ASCORBIC ACID) 500 MG tablet Take 500 mg by mouth daily.   Zinc 50 MG CAPS Take 50 mg by mouth daily.   OVER THE COUNTER MEDICATION Take 1 capsule by mouth in the morning and at bedtime. Golo Release (Patient not taking: Reported on 06/13/2023)   No facility-administered encounter medications on file as of 06/13/2023.    Allergies (verified) Codeine, Other, Penicillins, and Percocet [oxycodone-acetaminophen]   History: Past Medical History:  Diagnosis Date   Arthritis    Hypertension    Uterine prolapse    had a Gelhorn pessary implant   Past  Surgical History:  Procedure Laterality Date   ABDOMINAL HYSTERECTOMY  2019   donut     Gelhorn  pessary for uterine prolapse   HERNIA REPAIR     KNEE ARTHROSCOPY     Dr Hollice Gong   KNEE CLOSED REDUCTION Right 03/20/2021   Procedure: CLOSED MANIPULATION KNEE;  Surgeon: Ollen Gross, MD;  Location: [facility];  Service: Orthopedics;  Laterality: Right;    OOPHORECTOMY     one ovary removed due to ectopic   REDUCTION MAMMAPLASTY Bilateral 2002   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   Procedure: TOTAL KNEE ARTHROPLASTY;  Surgeon: Nestor Lewandowsky, MD;  Location: [facility];  Service: Orthopedics;  Laterality: Left;  DEPUY/SIGMA   TOTAL KNEE ARTHROPLASTY Right 01/09/2021   Procedure: TOTAL KNEE ARTHROPLASTY;  Surgeon: Ollen Gross, MD;  Location: [facility];  Service: Orthopedics;  Laterality: Right;    Family History  Problem Relation Age of Onset   Heart disease Mother    Other Father        shot by girlfriend   Heart disease Brother    Social History   Socioeconomic History   Marital status: Widowed    Spouse name: Not on file   Number of children: 2   Years of education: Not on file   Highest education level: Some college, no degree  Occupational History   Occupation: retired  Tobacco Use   Smoking status: Former    Current packs/day: 0.00    Average packs/day: 0.3 packs/day for 8.0 years (2.0 ttl pk-yrs)    Types: Cigarettes    Start date: 73    Quit date: 11/26/1978    Years since quitting: 44.5   Smokeless tobacco: Never  Vaping Use   Vaping status: Never Used  Substance and Sexual Activity   Alcohol use: Yes    Alcohol/week: 1.0 standard drink of alcohol    Types: 1 Glasses of wine per week    Comment: occasionally   Drug use: No   Sexual activity: Not on file  Other Topics Concern   Not on file  Social History Narrative   Widowed, 2 children both live in [place]   Social Determinants of Health   Financial Resource Strain: Medium Risk (06/06/2023)    Overall Financial Resource Strain (CARDIA)    Difficulty of Paying Living Expenses: Somewhat hard  Food Insecurity: No Food Insecurity (06/06/2023)   Hunger Vital Sign    Worried About Running Out of Food in the Last Year: Never true    Ran Out of Food in the Last Year: Never true  Transportation Needs: No Transportation Needs (06/06/2023)   PRAPARE - Administrator, Civil Service (Medical): No    Lack of Transportation (Non-Medical): No  Physical Activity: Sufficiently Active (06/06/2023)   Exercise Vital Sign  Days of Exercise per Week: 7 days    Minutes of Exercise per Session: 120 min  Stress: No Stress Concern Present (06/06/2023)   [employer] of Occupational Health - Occupational Stress Questionnaire    Feeling of Stress : Not at all  Social Connections: Moderately Integrated (06/06/2023)   Social Connection and Isolation Panel [NHANES]    Frequency of Communication with Friends and Family: More than three times a week    Frequency of Social Gatherings with Friends and Family: More than three times a week    Attends Religious Services: More than 4 times per year    Active Member of [employer] or Organizations: Yes    Attends Banker Meetings: More than 4 times per year    Marital Status: Widowed    Tobacco Counseling Counseling given: Not Answered   Clinical Intake:  Pre-visit preparation completed: Yes  Pain : 0-10 Pain Score: 5  Pain Type: Acute pain Pain Location: Neck Pain Descriptors / Indicators: Burning Pain Onset: In the past 7 days Pain Frequency: Intermittent     BMI - recorded: 39.49 Nutritional Status: BMI > 30  Obese Nutritional Risks: None Diabetes: No  How often do you need to have someone help you when you read instructions, pamphlets, or other written materials from your doctor or pharmacy?: 1 - Never  Interpreter Needed?: No  Information entered by :: Tora Kindred, CMA   Activities of Daily Living    06/06/2023     9:41 AM  In your present state of health, do you have any difficulty performing the following activities:  Hearing? 0  Vision? 0  Difficulty concentrating or making decisions? 0  Walking or climbing stairs? 0  Dressing or bathing? 0  Doing errands, shopping? 0  Preparing Food and eating ? N  Using the Toilet? N  In the past six months, have you accidently leaked urine? N  Do you have problems with loss of bowel control? N  Managing your Medications? N  Managing your Finances? N  Housekeeping or managing your Housekeeping? N    Patient Care Team: Sharlene Dory, DO as PCP - General (Family Medicine)  Indicate any recent Medical Services you may have received from other than [facility] providers in the past year (date may be approximate).     Assessment:   This is a routine wellness examination for [place].  Hearing/Vision screen Hearing Screening - Comments:: Denies hearing loss  Dietary issues and exercise activities discussed:     Goals Addressed               This Visit's Progress     COMPLETED: Maintain health and independence        COMPLETED: Patient Stated        Continue to lose weight      Patient Stated (pt-stated)        Get more exercise on her bike and treatmill      Depression Screen    06/13/2023    4:11 PM 09/21/2022    8:32 AM 06/04/2022   11:40 AM 09/20/2021    8:33 AM 05/09/2021    8:41 AM 03/21/2021    8:40 AM 01/25/2016    8:36 AM  PHQ 2/9 Scores  PHQ - 2 Score 0 0 0 0 0 0 0  PHQ- 9 Score 0 0         Fall Risk    06/06/2023    9:41 AM 03/25/2023    8:34 AM  09/21/2022    8:32 AM 06/04/2022   11:39 AM 05/28/2022    2:17 PM  Fall Risk   Falls in the past year? 0 0 0 0 0  Number falls in past yr: 0 0 0 0   Injury with Fall? 0 0 0 0 0  Risk for fall due to : No Fall Risks  No Fall Risks    Follow up Falls prevention discussed  Falls evaluation completed Falls prevention discussed     MEDICARE RISK AT HOME:   TIMED UP AND  GO:  Was the test performed?  No    Cognitive Function:        06/13/2023    4:17 PM  6CIT Screen  What Year? 0 points  What month? 0 points  What time? 0 points  Count back from 20 0 points  Months in reverse 2 points  Repeat phrase 2 points  Total Score 4 points    Immunizations Immunization History  Administered Date(s) Administered   PFIZER(Purple Top)SARS-COV-2 Vaccination 01/17/2020, 02/09/2020, 08/27/2020, 04/12/2021   PNEUMOCOCCAL CONJUGATE-20 03/21/2022   PPD Test 12/12/2012   Pfizer Covid-19 Vaccine Bivalent Booster 59yrs & up 08/24/2021   Pneumococcal Polysaccharide-23 09/22/2019   Tdap 06/05/2022   Zoster Recombinant(Shingrix) 03/08/2022, 06/21/2022    TDAP status: Up to date  Flu Vaccine status: Declined, Education has been provided regarding the importance of this vaccine but patient still declined. Advised may receive this vaccine at local pharmacy or Health Dept. Aware to provide a copy of the vaccination record if obtained from local pharmacy or Health Dept. Verbalized acceptance and understanding.  Pneumococcal vaccine status: Up to date  Covid-19 vaccine status: Completed vaccines  Qualifies for Shingles Vaccine? Yes   Zostavax completed No   Shingrix Completed?: Yes  Screening Tests Health Maintenance  Topic Date Due   COVID-19 Vaccine (6 - 2023-24 season) 09/24/2023 (Originally 07/27/2022)   Medicare Annual Wellness (AWV)  06/12/2024   MAMMOGRAM  05/26/2025   Colonoscopy  09/30/2028   DTaP/Tdap/Td (2 - Td or Tdap) 06/05/2032   Pneumonia Vaccine 57+ Years old  Completed   DEXA SCAN  Completed   Hepatitis C Screening  Completed   Zoster Vaccines- Shingrix  Completed   HPV VACCINES  Aged Out   INFLUENZA VACCINE  Discontinued    Health Maintenance  There are no preventive care reminders to display for this patient.   Colorectal cancer screening: Type of screening: Colonoscopy. Completed 09/30/18. Repeat every 10 years  Mammogram status:  Completed 05/27/23. Repeat every year  Bone Density status: Completed 06/18/22. Results reflect: Bone density results: NORMAL. Repeat every 2 years.  Lung Cancer Screening: (Low Dose CT Chest recommended if Age 44-80 years, 20 pack-year currently smoking OR have quit w/in 15years.) does not qualify.   Lung Cancer Screening Referral: n/a  Additional Screening:  Hepatitis C Screening: does qualify; Completed 01/25/16  Vision Screening: Recommended annual ophthalmology exams for early detection of glaucoma and other disorders of the eye. Is the patient up to date with their annual eye exam?  Yes  Who is the provider or what is the name of the office in which the patient attends annual eye exams? [facility] If pt is not established with a provider, would they like to be referred to a provider to establish care? No .   Dental Screening: Recommended annual dental exams for proper oral hygiene  Diabetic Foot Exam: n/a  Community Resource Referral / Chronic Care Management: CRR required  this visit?  No   CCM required this visit?  No     Plan:     I have personally reviewed and noted the following in the patient's chart:   Medical and social history Use of alcohol, tobacco or illicit drugs  Current medications and supplements including opioid prescriptions. Patient is not currently taking opioid prescriptions. Functional ability and status Nutritional status Physical activity Advanced directives List of other physicians Hospitalizations, surgeries, and ER visits in previous 12 months Vitals Screenings to include cognitive, depression, and falls Referrals and appointments  In addition, I have reviewed and discussed with patient certain preventive protocols, quality metrics, and best practice recommendations. A written personalized care plan for preventive services as well as general preventive health recommendations were provided to patient.     Tora Kindred,  CMA   06/13/2023   After Visit Summary: (MyChart) Due to this being a telephonic visit, the after visit summary with patients personalized plan was offered to patient via MyChart   Nurse Notes:  6 CIT Score - 4

## 2023-06-13 NOTE — Patient Instructions (Signed)
Holly Herring , Thank you for taking time to come for your Medicare Wellness Visit. I appreciate your ongoing commitment to your health goals. Please review the following plan we discussed and let me know if I can assist you in the future.   These are the goals we discussed:  Goals       Patient Stated (pt-stated)      Get more exercise on her bike and treatmill        This is a list of the screening recommended for you and due dates:  Health Maintenance  Topic Date Due   COVID-19 Vaccine (6 - 2023-24 season) 09/24/2023*   Mammogram  05/26/2024   Medicare Annual Wellness Visit  06/12/2024   DEXA scan (bone density measurement)  06/18/2024   Colon Cancer Screening  09/30/2028   DTaP/Tdap/Td vaccine (2 - Td or Tdap) 06/05/2032   Pneumonia Vaccine  Completed   Hepatitis C Screening  Completed   Zoster (Shingles) Vaccine  Completed   HPV Vaccine  Aged Out   Flu Shot  Discontinued  *Topic was postponed. The date shown is not the original due date.    Advanced directives: Information on Advanced Care Planning can be found at [facility] Advance Health Care Directives Advance Health Care Directives ([email or web address]    Conditions/risks identified: Schedule dental exam  Next appointment: Follow up in one year for your annual wellness visit 06/16/24 @ 3pm   Preventive Care 65 Years and Older, Female Preventive care refers to lifestyle choices and visits with your health care provider that can promote health and wellness. What does preventive care include? A yearly physical exam. This is also called an annual well check. Dental exams once or twice a year. Routine eye exams. Ask your health care provider how often you should have your eyes checked. Personal lifestyle choices, including: Daily care of your teeth and gums. Regular physical activity. Eating a healthy diet. Avoiding tobacco and drug use. Limiting alcohol use. Practicing safe sex. Taking low-dose  aspirin every day. Taking vitamin and mineral supplements as recommended by your health care provider. What happens during an annual well check? The services and screenings done by your health care provider during your annual well check will depend on your age, overall health, lifestyle risk factors, and family history of disease. Counseling  Your health care provider may ask you questions about your: Alcohol use. Tobacco use. Drug use. Emotional well-being. Home and relationship well-being. Sexual activity. Eating habits. History of falls. Memory and ability to understand (cognition). Work and work Astronomer. Reproductive health. Screening  You may have the following tests or measurements: Height, weight, and BMI. Blood pressure. Lipid and cholesterol levels. These may be checked every 5 years, or more frequently if you are over 2 years old. Skin check. Lung cancer screening. You may have this screening every year starting at age 68 if you have a 30-pack-year history of smoking and currently smoke or have quit within the past 15 years. Fecal occult blood test (FOBT) of the stool. You may have this test every year starting at age 53. Flexible sigmoidoscopy or colonoscopy. You may have a sigmoidoscopy every 5 years or a colonoscopy every 10 years starting at age 71. Hepatitis C blood test. Hepatitis B blood test. Sexually transmitted disease (STD) testing. Diabetes screening. This is done by checking your blood sugar (glucose) after you have not eaten for a while (fasting). You may have this done every 1-3 years. Bone density scan.  This is done to screen for osteoporosis. You may have this done starting at age 13. Mammogram. This may be done every 1-2 years. Talk to your health care provider about how often you should have regular mammograms. Talk with your health care provider about your test results, treatment options, and if necessary, the need for more tests. Vaccines  Your  health care provider may recommend certain vaccines, such as: Influenza vaccine. This is recommended every year. Tetanus, diphtheria, and acellular pertussis (Tdap, Td) vaccine. You may need a Td booster every 10 years. Zoster vaccine. You may need this after age 89. Pneumococcal 13-valent conjugate (PCV13) vaccine. One dose is recommended after age 24. Pneumococcal polysaccharide (PPSV23) vaccine. One dose is recommended after age 72. Talk to your health care provider about which screenings and vaccines you need and how often you need them. This information is not intended to replace advice given to you by your health care provider. Make sure you discuss any questions you have with your health care provider. Document Released: 12/09/2015 Document Revised: 08/01/2016 Document Reviewed: 09/13/2015 Elsevier Interactive Patient Education  2017 [employer].  Fall Prevention in the Home Falls can cause injuries. They can happen to people of all ages. There are many things you can do to make your home safe and to help prevent falls. What can I do on the outside of my home? Regularly fix the edges of walkways and driveways and fix any cracks. Remove anything that might make you trip as you walk through a door, such as a raised step or threshold. Trim any bushes or trees on the path to your home. Use bright outdoor lighting. Clear any walking paths of anything that might make someone trip, such as rocks or tools. Regularly check to see if handrails are loose or broken. Make sure that both sides of any steps have handrails. Any raised decks and porches should have guardrails on the edges. Have any leaves, snow, or ice cleared regularly. Use sand or salt on walking paths during winter. Clean up any spills in your garage right away. This includes oil or grease spills. What can I do in the bathroom? Use night lights. Install grab bars by the toilet and in the tub and shower. Do not use towel bars as  grab bars. Use non-skid mats or decals in the tub or shower. If you need to sit down in the shower, use a plastic, non-slip stool. Keep the floor dry. Clean up any water that spills on the floor as soon as it happens. Remove soap buildup in the tub or shower regularly. Attach bath mats securely with double-sided non-slip rug tape. Do not have throw rugs and other things on the floor that can make you trip. What can I do in the bedroom? Use night lights. Make sure that you have a light by your bed that is easy to reach. Do not use any sheets or blankets that are too big for your bed. They should not hang down onto the floor. Have a firm chair that has side arms. You can use this for support while you get dressed. Do not have throw rugs and other things on the floor that can make you trip. What can I do in the kitchen? Clean up any spills right away. Avoid walking on wet floors. Keep items that you use a lot in easy-to-reach places. If you need to reach something above you, use a strong step stool that has a grab bar. Keep electrical cords  out of the way. Do not use floor polish or wax that makes floors slippery. If you must use wax, use non-skid floor wax. Do not have throw rugs and other things on the floor that can make you trip. What can I do with my stairs? Do not leave any items on the stairs. Make sure that there are handrails on both sides of the stairs and use them. Fix handrails that are broken or loose. Make sure that handrails are as long as the stairways. Check any carpeting to make sure that it is firmly attached to the stairs. Fix any carpet that is loose or worn. Avoid having throw rugs at the top or bottom of the stairs. If you do have throw rugs, attach them to the floor with carpet tape. Make sure that you have a light switch at the top of the stairs and the bottom of the stairs. If you do not have them, ask someone to add them for you. What else can I do to help prevent  falls? Wear shoes that: Do not have high heels. Have rubber bottoms. Are comfortable and fit you well. Are closed at the toe. Do not wear sandals. If you use a stepladder: Make sure that it is fully opened. Do not climb a closed stepladder. Make sure that both sides of the stepladder are locked into place. Ask someone to hold it for you, if possible. Clearly mark and make sure that you can see: Any grab bars or handrails. First and last steps. Where the edge of each step is. Use tools that help you move around (mobility aids) if they are needed. These include: Canes. Walkers. Scooters. Crutches. Turn on the lights when you go into a dark area. Replace any light bulbs as soon as they burn out. Set up your furniture so you have a clear path. Avoid moving your furniture around. If any of your floors are uneven, fix them. If there are any pets around you, be aware of where they are. Review your medicines with your doctor. Some medicines can make you feel dizzy. This can increase your chance of falling. Ask your doctor what other things that you can do to help prevent falls. This information is not intended to replace advice given to you by your health care provider. Make sure you discuss any questions you have with your health care provider. Document Released: 09/08/2009 Document Revised: 04/19/2016 Document Reviewed: 12/17/2014 Elsevier Interactive Patient Education  2017 [employer].

## 2023-06-19 ENCOUNTER — Telehealth: Payer: Self-pay | Admitting: Family Medicine

## 2023-06-19 NOTE — Telephone Encounter (Signed)
Patient called and was seen on 7.16 for neck pain, she was prescribed two medications but she states she has stopped taking them as they make her light headed.

## 2023-06-20 NOTE — Telephone Encounter (Signed)
Pt stated she is doing well & doing much better. Pt denied PT

## 2023-06-20 NOTE — Telephone Encounter (Signed)
I did not see her for this but if she is still having issues, would recommend she be seen unless the doc who saw her had another plan. Ty.

## 2023-07-31 ENCOUNTER — Other Ambulatory Visit: Payer: Self-pay | Admitting: Family Medicine

## 2023-07-31 DIAGNOSIS — N952 Postmenopausal atrophic vaginitis: Secondary | ICD-10-CM

## 2023-08-21 ENCOUNTER — Ambulatory Visit: Payer: Medicare Other | Admitting: Obstetrics & Gynecology

## 2023-08-21 ENCOUNTER — Encounter: Payer: Self-pay | Admitting: Obstetrics & Gynecology

## 2023-08-21 VITALS — BP 145/90 | HR 90 | Ht 61.0 in | Wt 221.0 lb

## 2023-08-21 DIAGNOSIS — N8110 Cystocele, unspecified: Secondary | ICD-10-CM | POA: Diagnosis not present

## 2023-08-21 DIAGNOSIS — Z1339 Encounter for screening examination for other mental health and behavioral disorders: Secondary | ICD-10-CM

## 2023-08-21 DIAGNOSIS — Z01419 Encounter for gynecological examination (general) (routine) without abnormal findings: Secondary | ICD-10-CM | POA: Diagnosis not present

## 2023-08-21 NOTE — Progress Notes (Signed)
Pt here for annual appt and pessary cleaning and insertion- no concerns today  Last mammogram 05/27/23- normal

## 2023-08-21 NOTE — Progress Notes (Signed)
Subjective:     Holly Herring is a 72 y.o. female here for a routine exam.  Current complaints: Pt here for annual and pessary removal and cleaning. She did not get a notice so missed her f/u appts for pessary maintenance. She denies problems.   She is unable to remove her pessary at home.    Gynecologic History No LMP recorded. Patient is postmenopausal. Contraception: post menopausal status Last Pap: 08/12/2018. Results were: normal Last mammogram: 05/27/2023. Results were: normal  Obstetric History OB History  Gravida Para Term Preterm AB Living  2 2 2         SAB IAB Ectopic Multiple Live Births          2    # Outcome Date GA Lbr Len/2nd Weight Sex Type Anes PTL Lv  2 Term      Vag-Spont     1 Term      Vag-Spont      The following portions of the patient's history were reviewed and updated as appropriate: allergies, current medications, past family history, past medical history, past social history, past surgical history, and problem list.  Review of Systems Pertinent items are noted in HPI.    Objective:  BP (!) 145/90   Pulse 90   Ht 5\' 1"  (1.549 m)   Wt 221 lb (100.2 kg)   BMI 41.76 kg/m  General Appearance:    Alert, cooperative, no distress, appears stated age  Head:    Normocephalic, without obvious abnormality, atraumatic  Eyes:    conjunctiva/corneas clear, EOM's intact, both eyes  Ears:    Normal external ear canals, both ears  Nose:   Nares normal, septum midline, mucosa normal, no drainage    or sinus tenderness  Throat:   Lips, mucosa, and tongue normal; teeth and gums normal  Neck:   Supple, symmetrical, trachea midline, no adenopathy;    thyroid:  no enlargement/tenderness/nodules  Back:     Symmetric, no curvature, ROM normal, no CVA tenderness  Lungs:     respirations unlabored  Chest Wall:    No tenderness or deformity   Heart:    Regular rate and rhythm  Breast Exam:    No tenderness, masses, or nipple abnormality  Abdomen:     Soft, non-tender,  bowel sounds active all four quadrants,    no masses, no organomegaly  Genitalia:    Normal female without lesion, discharge or tenderness   The vaginal tissue is intact and healthy.  The pessary was removed, cleaned and replaced without difficulty.    Extremities:   Extremities normal, atraumatic, no cyanosis or edema  Pulses:   2+ and symmetric all extremities  Skin:   Skin color, texture, turgor normal, no rashes or lesions     Assessment:    Healthy female exam.  Pessary maintenance.   Plan:   Holly Quin "[email or web address]" was seen today for annual exam.  Diagnoses and all orders for this visit:  Well female exam with routine gynecological exam  Bladder prolapse, female, acquired   F/u in 3 months to have pessary checked and cleaned,.   Tayo Maute L. Harraway-Smith, M.D., [facility]

## 2023-10-01 ENCOUNTER — Ambulatory Visit: Payer: Medicare Other | Admitting: Family Medicine

## 2023-10-01 ENCOUNTER — Telehealth: Payer: Self-pay | Admitting: Family Medicine

## 2023-10-01 ENCOUNTER — Encounter: Payer: Self-pay | Admitting: Family Medicine

## 2023-10-01 VITALS — BP 126/74 | HR 84 | Temp 98.0°F | Resp 16 | Ht 61.0 in | Wt 222.0 lb

## 2023-10-01 DIAGNOSIS — N952 Postmenopausal atrophic vaginitis: Secondary | ICD-10-CM

## 2023-10-01 DIAGNOSIS — Z0000 Encounter for general adult medical examination without abnormal findings: Secondary | ICD-10-CM | POA: Diagnosis not present

## 2023-10-01 DIAGNOSIS — I10 Essential (primary) hypertension: Secondary | ICD-10-CM

## 2023-10-01 LAB — COMPREHENSIVE METABOLIC PANEL
ALT: 14 U/L (ref 0–35)
AST: 21 U/L (ref 0–37)
Albumin: 4.2 g/dL (ref 3.5–5.2)
Alkaline Phosphatase: 47 U/L (ref 39–117)
BUN: 19 mg/dL (ref 6–23)
CO2: 28 meq/L (ref 19–32)
Calcium: 10 mg/dL (ref 8.4–10.5)
Chloride: 101 meq/L (ref 96–112)
Creatinine, Ser: 0.91 mg/dL (ref 0.40–1.20)
GFR: 63.81 mL/min (ref >60.00)
Glucose, Bld: 82 mg/dL (ref 70–99)
Potassium: 4.3 meq/L (ref 3.5–5.1)
Sodium: 139 meq/L (ref 135–145)
Total Bilirubin: 0.3 mg/dL (ref 0.2–1.2)
Total Protein: 7.6 g/dL (ref 6.0–8.3)

## 2023-10-01 LAB — LIPID PANEL
Cholesterol: 170 mg/dL (ref 0–200)
HDL: 99 mg/dL (ref >39.00)
LDL Cholesterol: 61 mg/dL (ref 0–99)
NonHDL: 71.44
Total CHOL/HDL Ratio: 2
Triglycerides: 51 mg/dL (ref 0.0–149.0)
VLDL: 10.2 mg/dL (ref 0.0–40.0)

## 2023-10-01 LAB — CBC
HCT: 39.8 % (ref 36.0–46.0)
Hemoglobin: 12.6 g/dL (ref 12.0–15.0)
MCHC: 31.6 g/dL (ref 30.0–36.0)
MCV: 86.7 fL (ref 78.0–100.0)
Platelets: 317 10*3/uL (ref 150.0–400.0)
RBC: 4.59 Mil/uL (ref 3.87–5.11)
RDW: 14.6 % (ref 11.5–15.5)
WBC: 6.2 10*3/uL (ref 4.0–10.5)

## 2023-10-01 MED ORDER — ESTRADIOL 1 MG PO TABS: 1.0000 mg | ORAL_TABLET | Freq: Every day | ORAL | 3 refills | Status: DC

## 2023-10-01 MED ORDER — ROSUVASTATIN CALCIUM 20 MG PO TABS: 20.0000 mg | ORAL_TABLET | Freq: Every day | ORAL | 3 refills | Status: DC

## 2023-10-01 MED ORDER — LISINOPRIL-HYDROCHLOROTHIAZIDE 10-12.5 MG PO TABS: 1.0000 | ORAL_TABLET | Freq: Every day | ORAL | 3 refills | Status: DC

## 2023-10-01 NOTE — Telephone Encounter (Signed)
Covid shot has been updated in chart.

## 2023-10-01 NOTE — Patient Instructions (Addendum)
Give [place] 2-3 business days to get the results of your labs back.   Keep the diet clean and stay active.  Let us know if you change your mind regarding a flu shot.   Foods that may reduce pain: 1) Ginger 2) Blueberries 3) Salmon 4) Pumpkin seeds 5) dark chocolate 6) turmeric 7) tart cherries 8) virgin olive oil 9) chilli peppers 10) mint 11) krill oil  Let us know if you need anything.

## 2023-10-01 NOTE — Progress Notes (Signed)
Chief Complaint  Patient presents with   Annual Exam    Annual Exam     Well Woman Holly Herring is here for a complete physical.   Her last physical was >1 year ago.  Current diet: in general, a "healthy" diet. Current exercise: walking, wt training. Weight is stable and she denies daytime fatigue. Seatbelt? Yes Advanced directive? Yes  Health Maintenance Shingrix- Yes DEXA- Yes CCS- Yes Mammogram- Yes Tetanus- Yes Pneumonia- Yes Hep C screen- Yes  Past Medical History:  Diagnosis Date   Arthritis    Hypertension    Uterine prolapse    had a Gelhorn pessary implant     Past Surgical History:  Procedure Laterality Date   ABDOMINAL HYSTERECTOMY  2019   donut     Gelhorn  pessary for uterine prolapse   HERNIA REPAIR     KNEE ARTHROSCOPY     Dr Hollice Gong   KNEE CLOSED REDUCTION Right 03/20/2021   Procedure: CLOSED MANIPULATION KNEE;  Surgeon: Ollen Gross, MD;  Location: [facility];  Service: Orthopedics;  Laterality: Right;    OOPHORECTOMY     one ovary removed due to ectopic   REDUCTION MAMMAPLASTY Bilateral 2002   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 Swallow whole.     calcium carbonate (OS-CAL) 600 MG TABS tablet Take 600 mg by mouth daily with breakfast.     Cod Liver Oil CAPS Take 1 capsule by mouth daily.     diphenhydrAMINE (SLEEP AID, DIPHENHYDRAMINE,) 25 MG tablet Take 50 mg by mouth at bedtime.     Magnesium 500 MG CAPS Take 500 mg by mouth daily.     OVER THE COUNTER  MEDICATION Take 1 capsule by mouth in the morning and at bedtime. Golo Release     POTASSIUM GLUCONATE PO Take 500 mg by mouth daily.     vitamin B-12 (CYANOCOBALAMIN) 1000 MCG tablet Take 1,000 mcg by mouth daily.     vitamin C (ASCORBIC ACID) 500 MG tablet Take 500 mg by mouth daily.     Zinc 50 MG CAPS Take 50 mg by mouth daily.      Allergies Allergies  Allergen Reactions   Codeine Itching    Can't sleep    Other     NO BLOOD OR BLOOD PRODUCTS   Penicillins Hives    Tolerated Cephalosporin Date: 01/10/21.     Percocet [Oxycodone-Acetaminophen] Itching    Has to take benadryl prior    Review of Systems: Constitutional:  no fevers Eye:  no recent significant change in vision Ears:  No changes in hearing Nose/Mouth/Throat:  no complaints of nasal congestion, no sore throat Cardiovascular: no chest pain Respiratory:  No shortness of breath Gastrointestinal:  No change in bowel habits GU:  Female: negative for dysuria Integumentary:  no abnormal skin lesions reported Neurologic:  no headaches Endocrine:  denies unexplained weight changes  Exam BP 126/74 (BP Location: Left Arm, Patient Position: Sitting, Cuff Size: Normal)   Pulse 84   Temp  98 F (36.7 C) (Oral)   Resp 16   Ht 5\' 1"  (1.549 m)   Wt 222 lb (100.7 kg)   SpO2 96%   BMI 41.95 kg/m  General:  well developed, well nourished, in no apparent distress Skin:  no significant moles, warts, or growths Head:  no masses, lesions, or tenderness Eyes:  pupils equal and round, sclera anicteric without injection Ears:  canals without lesions, TMs shiny without retraction, no obvious effusion, no erythema Nose:  nares patent, mucosa normal, and no drainage Throat/Pharynx:  lips and gingiva without lesion; tongue and uvula midline; non-inflamed pharynx; no exudates or postnasal drainage Neck: neck supple without adenopathy, thyromegaly, or masses Lungs:  clear to auscultation, breath sounds equal bilaterally, no  respiratory distress Cardio:  regular rate and rhythm, no bruits or LE edema Abdomen:  abdomen soft, nontender; bowel sounds normal; no masses or organomegaly Genital: Deferred Neuro:  gait normal; deep tendon reflexes normal and symmetric Psych: well oriented with normal range of affect and appropriate judgment/insight  Assessment and Plan  Well adult exam  Vaginal atrophy - Plan: estradiol (ESTRACE) 1 MG tablet  Essential hypertension - Plan: CBC, Comprehensive metabolic panel, Lipid panel   Well 72 y.o. female. Counseled on diet and exercise. Flu shot politely declined.  Other orders as above. Follow up in 6 mo. The patient voiced understanding and agreement to the plan.  Holly Roche Foxworth, DO 10/01/23 8:24 AM

## 2023-10-01 NOTE — Telephone Encounter (Signed)
Pt called to let Dr. Carmelia Roller know that she got her last Covid booster at the following:  [facility] [address], [place], [place] [address] P: [phone] DOS: 9.30.24

## 2023-10-31 ENCOUNTER — Ambulatory Visit: Payer: Medicare Other | Admitting: Physician Assistant

## 2023-10-31 ENCOUNTER — Telehealth: Payer: Self-pay | Admitting: Family Medicine

## 2023-10-31 ENCOUNTER — Encounter: Payer: Self-pay | Admitting: Physician Assistant

## 2023-10-31 VITALS — BP 162/88 | HR 86 | Temp 97.9°F | Ht 61.0 in | Wt 220.0 lb

## 2023-10-31 DIAGNOSIS — I10 Essential (primary) hypertension: Secondary | ICD-10-CM | POA: Diagnosis not present

## 2023-10-31 NOTE — Progress Notes (Signed)
Established patient visit   Patient: Holly Herring   DOB: 04-Feb-1951   72 y.o. Female  MRN: [id number] Visit Date: 10/31/2023  Today's healthcare provider: Alfredia Ferguson, PA-C   Chief Complaint  Patient presents with   Hypertension    Went to dentist this morning and BP was high. 630-7am she took her medication    Form Completion    Needing form filled out from dentist to have procedure- caps on teeth    Subjective     Pt reports she went to the dentist the morning for a procedure, they checked her BP and it was high, recommended she f/u with her PCP and get clearance for the dental procedure. She needs caps.   She is consistent with taking her medication.  Medications: Outpatient Medications Prior to Visit  Medication Sig   aspirin EC 81 MG tablet Take 81 mg by mouth daily. Swallow whole.   calcium carbonate (OS-CAL) 600 MG TABS tablet Take 600 mg by mouth daily with breakfast.   Cod Liver Oil CAPS Take 1 capsule by mouth daily.   diphenhydrAMINE (SLEEP AID, DIPHENHYDRAMINE,) 25 MG tablet Take 50 mg by mouth at bedtime.   estradiol (ESTRACE) 1 MG tablet Take 1 tablet (1 mg total) by mouth daily.   lisinopril-hydrochlorothiazide (ZESTORETIC) 10-12.5 MG tablet Take 1 tablet by mouth daily.   Magnesium 500 MG CAPS Take 500 mg by mouth daily.   OVER THE COUNTER MEDICATION Take 1 capsule by mouth in the morning and at bedtime. Golo Release   POTASSIUM GLUCONATE PO Take 500 mg by mouth daily.   rosuvastatin (CRESTOR) 20 MG tablet Take 1 tablet (20 mg total) by mouth daily.   vitamin B-12 (CYANOCOBALAMIN) 1000 MCG tablet Take 1,000 mcg by mouth daily.   vitamin C (ASCORBIC ACID) 500 MG tablet Take 500 mg by mouth daily.   Zinc 50 MG CAPS Take 50 mg by mouth daily.   No facility-administered medications prior to visit.    Review of Systems  Constitutional:  Negative for fatigue and fever.  Respiratory:  Negative for cough and shortness of breath.   Cardiovascular:   Negative for chest pain and leg swelling.  Gastrointestinal:  Negative for abdominal pain.  Neurological:  Negative for dizziness and headaches.       Objective    BP (!) 162/88 (BP Location: Right Arm)   Pulse 86   Temp 97.9 F (36.6 C) (Oral)   Ht 5\' 1"  (1.549 m)   Wt 220 lb (99.8 kg)   SpO2 90%   BMI 41.57 kg/m    Physical Exam Constitutional:      General: She is awake.     Appearance: She is well-developed.  HENT:     Head: Normocephalic.  Eyes:     Conjunctiva/sclera: Conjunctivae normal.  Cardiovascular:     Rate and Rhythm: Normal rate and regular rhythm.     Heart sounds: Normal heart sounds.  Pulmonary:     Effort: Pulmonary effort is normal.  Skin:    General: Skin is warm.  Neurological:     Mental Status: She is alert and oriented to person, place, and time.  Psychiatric:        Attention and Perception: Attention normal.        Mood and Affect: Mood normal.        Speech: Speech normal.        Behavior: Behavior is cooperative.      No results  found for any visits on 10/31/23.  Assessment & Plan    Essential hypertension Assessment & Plan: Chronic, usually well controlled. Pt manages with lisinopril 10 mg, hydrochlorothiazide 12.5 mg daily . Pressures rechecked and more equal bilaterally. Signed clearance form for dentist; advised pt to check her BP before her next appt, if elevated > 140/90 can take an additional 1/2 tab of her bp med        Return if symptoms worsen or fail to improve.       Alfredia Ferguson, PA-C  [facility] [phone] (phone) [phone] (fax)  [facility]

## 2023-10-31 NOTE — Assessment & Plan Note (Addendum)
Chronic, usually well controlled. Pt manages with lisinopril 10 mg, hydrochlorothiazide 12.5 mg daily . Pressures rechecked and more equal bilaterally. Signed clearance form for dentist; advised pt to check her BP before her next appt, if elevated > 140/90 can take an additional 1/2 tab of her bp med

## 2023-10-31 NOTE — Telephone Encounter (Signed)
Hoy Morn with [facility] called to inform PCP that they received a medical release documentation. However, the doctor stated he is unclear of the information on it would like PCP to provide clarification. Please call and advise with them at [phone], option 2.

## 2023-11-01 NOTE — Telephone Encounter (Signed)
[facility] called back stating that they just needed a BP evaluation and needed to know if she is cleared for the procedure.  P: [phone]

## 2023-11-01 NOTE — Telephone Encounter (Signed)
Holly Herring cleared her per note...  I called and confirmed with dentist office.

## 2023-11-01 NOTE — Telephone Encounter (Signed)
Harley with [employer] called to follow up on whether patient is cleared for procedure. Pt's procedure was scheduled for 8:30 this morning and patient is waiting. Please call the office to advise if patient is okay to proceed with procedure.

## 2023-12-11 ENCOUNTER — Encounter: Payer: Self-pay | Admitting: Obstetrics & Gynecology

## 2023-12-11 ENCOUNTER — Ambulatory Visit: Payer: Medicare Other | Admitting: Obstetrics & Gynecology

## 2023-12-11 VITALS — BP 155/66 | HR 81 | Ht 61.0 in | Wt 219.0 lb

## 2023-12-11 DIAGNOSIS — N814 Uterovaginal prolapse, unspecified: Secondary | ICD-10-CM | POA: Diagnosis not present

## 2023-12-11 NOTE — Progress Notes (Signed)
 History:  73 y.o. G2P2000 here today for pessary removal and replacement after cleaning. Pt denies any  problems with pessary. No GYN complaints. She is UTD with her mammogram and colon cancer screening.    She has a JW conf coming up.    The following portions of the patient's history were reviewed and updated as appropriate: allergies, current medications, past family history, past medical history, past social history, past surgical history and problem list.   Review of Systems:  Pertinent items are noted in HPI.    Objective:  BP (!) 155/66 (BP Location: Left Arm, Patient Position: Sitting, Cuff Size: Large)   Pulse 81   Ht 5\' 1"  (1.549 m)   Wt 219 lb (99.3 kg)   BMI 41.38 kg/m     CONSTITUTIONAL: Well-developed, well-nourished female in no acute distress.  HENT:  Normocephalic, atraumatic EYES: Conjunctivae and EOM are normal. No scleral icterus.  NECK: Normal range of motion SKIN: Skin is warm and dry. No rash noted. Not diaphoretic.No pallor. NEUROLGIC: Alert and oriented to person, place, and time. Normal coordination.  GYN: Pessary removed and cleaned; vaginal mucosa looks healthy;  no excoriations or breakdown. The cervix is without lesions. The bladder is prolapsed to the introitus without valsalva.   The pessary fits well.      Assessment & Plan:  Pessary maintenance: F/u in 3 months to have pessary removed and cleaned F/u sooner prn    Total face-to-face time with patient, review of chart, discussion with consultant and coordination of care was .     Evalene Vath L. Harraway-Smith, M.D., FACOG

## 2024-01-29 ENCOUNTER — Telehealth: Payer: Self-pay | Admitting: Family Medicine

## 2024-01-29 DIAGNOSIS — Z1231 Encounter for screening mammogram for malignant neoplasm of breast: Secondary | ICD-10-CM

## 2024-01-29 NOTE — Telephone Encounter (Signed)
 Copied from CRM [id number]. Topic: Referral - Question >> Jan 29, 2024 11:20 AM Adaysia C wrote: Reason for CRM: Patient is need of mammogram and she needs the provider to refer her to a facility. Please follow up with patient about mammogram referral #[phone]

## 2024-01-29 NOTE — Telephone Encounter (Signed)
 Mammogram order placed

## 2024-02-04 ENCOUNTER — Encounter: Payer: Self-pay | Admitting: Family Medicine

## 2024-02-04 ENCOUNTER — Ambulatory Visit (INDEPENDENT_AMBULATORY_CARE_PROVIDER_SITE_OTHER): Admitting: Family Medicine

## 2024-02-04 VITALS — BP 136/84 | HR 75 | Temp 98.7°F | Resp 16 | Ht 61.0 in | Wt 225.0 lb

## 2024-02-04 DIAGNOSIS — N644 Mastodynia: Secondary | ICD-10-CM

## 2024-02-04 NOTE — Progress Notes (Signed)
 Musculoskeletal Exam  Patient: Holly Herring DOB: 03-11-1951  DOS: 02/04/2024  SUBJECTIVE:  Chief Complaint:   Chief Complaint  Patient presents with   Breast Pain    Patient reports having left breast pain 2 days ago, getting better.     Holly Herring is a 73 y.o.  female for evaluation and treatment of L chest wall pain.   Onset:  2 days ago. No inj- has been doing over head stretches.  Location: L chest wall/breast Character:  aching  Progression of issue:  is unchanged Associated symptoms: no bruising, redness, swelling, fevers, nipple discharge, lumps/nodules Treatment: to date has been Vick's.   Neurovascular symptoms: no  Past Medical History:  Diagnosis Date   Arthritis    Hypertension    Uterine prolapse    had a Gelhorn pessary implant    Objective: VITAL SIGNS: BP 136/84 (BP Location: Left Arm, Cuff Size: Large)   Pulse 75   Temp 98.7 F (37.1 C) (Oral)   Resp 16   Ht 5\' 1"  (1.549 m)   Wt 225 lb (102.1 kg)   SpO2 98%   BMI 42.51 kg/m  Constitutional: Well formed, well developed. No acute distress. Thorax & Lungs: No accessory muscle use Breast: Examined in the presence of a female chaperone.  No external lesions or deformity.  Mild TTP over the 3 o'clock position of the breast without obvious deformity, erythema, ecchymosis, fluctuance, excessive warmth, or nodularity.  No obvious lymphadenopathy in the axillary region.  No nipple discharge. Psychiatric: Normal mood. Age appropriate judgment and insight. Alert & oriented x 3.    Assessment:  Breast pain, left - Plan: US BREAST COMPLETE UNI LEFT INC AXILLA, MM Digital Diagnostic Unilat L  Plan: Check diagnostic mammogram and ultrasound.  Hopefully a muscle strain.  Pectoral stretches and exercises provided.  Heat, ice, Tylenol.  F/u as originally scheduled. The patient voiced understanding and agreement to the plan.   [name] Cambria, DO 02/04/24  12:02 PM

## 2024-02-04 NOTE — Patient Instructions (Addendum)
 Ice/cold pack over area for 10-15 min twice daily.  OK to take Tylenol 1000 mg (2 extra strength tabs) or 975 mg (3 regular strength tabs) every 6 hours as needed.  Heat (pad or rice pillow in microwave) over affected area, 10-15 minutes twice daily.   Someone will reach out regarding your imaging. We will be in touch with your results.   Let us know if you need anything.  Pectoralis Major Rehab Ask your health care provider which exercises are safe for you. Do exercises exactly as told by your health care provider and adjust them as directed. It is normal to feel mild stretching, pulling, tightness, or discomfort as you do these exercises, but you should stop right away if you feel sudden pain or your pain gets worse. Do not begin these exercises until told by your health care provider. Stretching and range of motion exercises These exercises warm up your muscles and joints and improve the movement and flexibility of your shoulder. These exercises can also help to relieve pain, numbness, and tingling. Exercise A: Pendulum  Stand near a wall or a surface that you can hold onto for balance. Bend at the waist and let your left / right arm hang straight down. Use your other arm to keep your balance. Relax your arm and shoulder muscles, and move your hips and your trunk so your left / right arm swings freely. Your arm should swing because of the motion of your body, not because you are using your arm or shoulder muscles. Keep moving so your arm swings in the following directions, as told by your health care provider: Side to side. Forward and backward. In clockwise and counterclockwise circles. Slowly return to the starting position. Repeat 2 times. Complete this exercise 3 times per week. Exercise B: Abduction, standing Stand and hold a broomstick, a cane, or a similar object. Place your hands a little more than shoulder-width apart on the object. Your left / right hand should be palm-up, and  your other hand should be palm-down. While keeping your elbow straight and your shoulder muscles relaxed, push the stick across your body toward your left / right side. Raise your left / right arm to the side of your body and then over your head until you feel a stretch in your shoulder. Stop when you reach the angle that is recommended by your health care provider. Avoid shrugging your shoulder while you raise your arm. Keep your shoulder blade tucked down toward the middle of your spine. Hold for 10 seconds. Slowly return to the starting position. Repeat 2 times. Complete this exercise 3 times per week. Exercise C: Wand flexion, supine  Lie on your back. You may bend your knees for comfort. Hold a broomstick, a cane, or a similar object so that your hands are about shoulder-width apart on the object. Your palms should face toward your feet. Raise your left / right arm in front of your face, then behind your head (toward the floor). Use your other hand to help you do this. Stop when you feel a gentle stretch in your shoulder, or when you reach the angle that is recommended by your health care provider. Hold for 3 seconds. Use the broomstick and your other arm to help you return your left / right arm to the starting position. Repeat 2 times. Complete this exercise 3 times per week. Exercise D: Wand shoulder external rotation Stand and hold a broomstick, a cane, or a similar object so your  hands are about shoulder-width apart on the object. Start with your arms hanging down, then bend both elbows to an "L" shape (90 degrees). Keep your left / right elbow at your side. Use your other hand to push the stick so your left / right forearm moves away from your body, out to your side. Keep your left / right elbow bent to 90 degrees and keep it against your side. Stop when you feel a gentle stretch in your shoulder, or when you reach the angle recommended by your health care provider. Hold for 10  seconds. Use the stick to help you return your left / right arm to the starting position. Repeat 2 times. Complete this exercise 3 times per week. Strengthening exercises These exercises build strength and endurance in your shoulder. Endurance is the ability to use your muscles for a long time, even after your muscles get tired. Exercise E: Scapular protraction, standing Stand so you are facing a wall. Place your feet about one arm-length away from the wall. Place your hands on the wall and straighten your elbows. Keep your hands on the wall as you push your upper back away from the wall. You should feel your shoulder blades sliding forward. Keep your elbows and your head still. If you are not sure that you are doing this exercise correctly, ask your health care provider for more instructions. Hold for 3 seconds. Slowly return to the starting position. Let your muscles relax completely before you repeat this exercise. Repeat 2 times. Complete this exercise 3 times per week. Exercise F: Shoulder blade squeezes  (scapular retraction) Sit with good posture in a stable chair. Do not let your back touch the back of the chair. Your arms should be at your sides with your elbows bent. You may rest your forearms on a pillow if that is more comfortable. Squeeze your shoulder blades together. Bring them down and back. Keep your shoulders level. Do not lift your shoulders up toward your ears. Hold for 3 seconds. Return to the starting position. Repeat 2 times. Complete this exercise 3 times per week. This information is not intended to replace advice given to you by your health care provider. Make sure you discuss any questions you have with your health care provider. Document Released: 11/12/2005 Document Revised: 08/23/2016 Document Reviewed: 07/31/2015 Elsevier Interactive Patient Education  [employer].

## 2024-02-19 ENCOUNTER — Ambulatory Visit

## 2024-02-19 ENCOUNTER — Ambulatory Visit
Admission: RE | Admit: 2024-02-19 | Discharge: 2024-02-19 | Disposition: A | Discharge status: Home / Self Care | Source: Ambulatory Visit | Attending: Family Medicine

## 2024-02-19 DIAGNOSIS — N644 Mastodynia: Secondary | ICD-10-CM

## 2024-03-11 ENCOUNTER — Encounter: Payer: Self-pay | Admitting: Obstetrics & Gynecology

## 2024-03-11 ENCOUNTER — Ambulatory Visit: Admitting: Obstetrics & Gynecology

## 2024-03-11 VITALS — BP 160/62 | HR 94 | Wt 222.0 lb

## 2024-03-11 DIAGNOSIS — N814 Uterovaginal prolapse, unspecified: Secondary | ICD-10-CM | POA: Diagnosis not present

## 2024-03-11 NOTE — Progress Notes (Signed)
  HPI Pt presents today to have pessary removed and cleaned.  She denies problems and reports that her pessary is working very well.  She denies further complaints.     Review of Systems       Objective:   Physical Exam BP (!) 160/62 (BP Location: Right Arm, Patient Position: Sitting, Cuff Size: Large)   Pulse 94   Wt 222 lb (100.7 kg)   BMI 41.95 kg/m   CONSTITUTIONAL: Well-developed, well-nourished female in no acute distress.  HENT:  Normocephalic, atraumatic EYES: Conjunctivae and EOM are normal. No scleral icterus.  NECK: Normal range of motion SKIN: Skin is warm and dry. No rash noted. Not diaphoretic.No pallor. NEUROLGIC: Alert and oriented to person, place, and time. Normal coordination.  GYN: Pessary removed and cleaned; vaginal mucosa looks healty and with no excoriations or breakdown     Assessment:     Pessary check - no problems today     Plan:     F/u in 3 months to have pessary removed and cleaned F/u sooner prn   Lamir Racca L. Harraway-Smith, M.D., FACOG

## 2024-03-30 ENCOUNTER — Encounter: Payer: Self-pay | Admitting: Family Medicine

## 2024-03-30 ENCOUNTER — Ambulatory Visit (INDEPENDENT_AMBULATORY_CARE_PROVIDER_SITE_OTHER): Payer: Medicare Other | Admitting: Family Medicine

## 2024-03-30 VITALS — BP 134/82 | HR 91 | Temp 98.0°F | Resp 16 | Ht 61.0 in | Wt 223.8 lb

## 2024-03-30 DIAGNOSIS — E7800 Pure hypercholesterolemia, unspecified: Secondary | ICD-10-CM

## 2024-03-30 DIAGNOSIS — I10 Essential (primary) hypertension: Secondary | ICD-10-CM

## 2024-03-30 DIAGNOSIS — N952 Postmenopausal atrophic vaginitis: Secondary | ICD-10-CM | POA: Diagnosis not present

## 2024-03-30 LAB — LIPID PANEL
Cholesterol: 184 mg/dL (ref 0–200)
HDL: 96 mg/dL (ref >39.00)
LDL Cholesterol: 79 mg/dL (ref 0–99)
NonHDL: 88.01
Total CHOL/HDL Ratio: 2
Triglycerides: 47 mg/dL (ref 0.0–149.0)
VLDL: 9.4 mg/dL (ref 0.0–40.0)

## 2024-03-30 LAB — COMPREHENSIVE METABOLIC PANEL WITH GFR
ALT: 13 U/L (ref 0–35)
AST: 20 U/L (ref 0–37)
Albumin: 4.4 g/dL (ref 3.5–5.2)
Alkaline Phosphatase: 50 U/L (ref 39–117)
BUN: 16 mg/dL (ref 6–23)
CO2: 28 meq/L (ref 19–32)
Calcium: 9.9 mg/dL (ref 8.4–10.5)
Chloride: 101 meq/L (ref 96–112)
Creatinine, Ser: 0.79 mg/dL (ref 0.40–1.20)
GFR: 75.35 mL/min (ref >60.00)
Glucose, Bld: 85 mg/dL (ref 70–99)
Potassium: 4.8 meq/L (ref 3.5–5.1)
Sodium: 139 meq/L (ref 135–145)
Total Bilirubin: 0.3 mg/dL (ref 0.2–1.2)
Total Protein: 7.7 g/dL (ref 6.0–8.3)

## 2024-03-30 MED ORDER — LISINOPRIL-HYDROCHLOROTHIAZIDE 10-12.5 MG PO TABS: 1.0000 | ORAL_TABLET | Freq: Every day | ORAL | 3 refills | Status: AC

## 2024-03-30 MED ORDER — ESTRADIOL 1 MG PO TABS: 1.0000 mg | ORAL_TABLET | Freq: Every day | ORAL | 3 refills | Status: DC

## 2024-03-30 MED ORDER — ROSUVASTATIN CALCIUM 20 MG PO TABS: 20.0000 mg | ORAL_TABLET | Freq: Every day | ORAL | 3 refills | Status: AC

## 2024-03-30 NOTE — Patient Instructions (Addendum)
 Give [place] 2-3 business days to get the results of your labs back.   Keep the diet clean and stay active.  Please consider adding some weight resistance exercise to your routine. Consider yoga as well.   Let us know if you need anything.

## 2024-03-30 NOTE — Progress Notes (Signed)
 Chief Complaint  Patient presents with   Follow-up    Follow up    Subjective Holly Herring is a 73 y.o. female who presents for hypertension follow up. She does monitor home blood pressures. Blood pressures ranging from 130's/70's on average. She is compliant with medication-Zestoretic  10-12.5 mg/d. Patient has these side effects of medication: none She is adhering to a healthy diet overall. Current exercise: walking, active at work No CP or SOB.   Hyperlipidemia Patient presents for dyslipidemia follow up. Currently being treated with Crestor  20 mg/d and compliance with treatment thus far has been good. She  denies  myalgias. Diet/exercise as above.  The patient is not known to have coexisting coronary artery disease.   Past Medical History:  Diagnosis Date   Arthritis    Hypertension    Uterine prolapse    had a Gelhorn pessary implant    Exam BP 134/82 (BP Location: Left Arm, Patient Position: Sitting)   Pulse 91   Temp 98 F (36.7 C) (Oral)   Resp 16   Ht 5\' 1"  (1.549 m)   Wt 223 lb 12.8 oz (101.5 kg)   SpO2 96%   BMI 42.29 kg/m  General:  well developed, well nourished, in no apparent distress Heart: RRR, no bruits, no LE edema Lungs: clear to auscultation, no accessory muscle use Psych: well oriented with normal range of affect and appropriate judgment/insight  Essential hypertension  Pure hypercholesterolemia - Plan: Comprehensive metabolic panel with GFR, Lipid panel  Vaginal atrophy - Plan: estradiol  (ESTRACE ) 1 MG tablet  Chronic, stable. Zestoretic  10-12.5 mg/d. Counseled on diet and exercise. Chronic, stable. Cont Crestor  20 mg/d.  F/u in 6 mo. The patient voiced understanding and agreement to the plan.  Shellie Dials Nicholson, DO 03/30/24  8:15 AM

## 2024-06-01 ENCOUNTER — Ambulatory Visit (HOSPITAL_BASED_OUTPATIENT_CLINIC_OR_DEPARTMENT_OTHER)
Admission: RE | Admit: 2024-06-01 | Discharge: 2024-06-01 | Disposition: A | Discharge status: Home / Self Care | Source: Ambulatory Visit | Attending: Family Medicine | Admitting: Family Medicine

## 2024-06-01 ENCOUNTER — Encounter (HOSPITAL_BASED_OUTPATIENT_CLINIC_OR_DEPARTMENT_OTHER): Payer: Self-pay

## 2024-06-01 DIAGNOSIS — Z1231 Encounter for screening mammogram for malignant neoplasm of breast: Secondary | ICD-10-CM | POA: Diagnosis not present

## 2024-06-10 ENCOUNTER — Ambulatory Visit: Admitting: Family Medicine

## 2024-06-10 ENCOUNTER — Ambulatory Visit: Admitting: Obstetrics & Gynecology

## 2024-06-10 ENCOUNTER — Encounter: Payer: Self-pay | Admitting: Obstetrics & Gynecology

## 2024-06-10 VITALS — BP 167/61 | HR 84 | Wt 196.1 lb

## 2024-06-10 DIAGNOSIS — Z4689 Encounter for fitting and adjustment of other specified devices: Secondary | ICD-10-CM

## 2024-06-10 DIAGNOSIS — N8110 Cystocele, unspecified: Secondary | ICD-10-CM

## 2024-06-10 NOTE — Progress Notes (Signed)
   GYNECOLOGY OFFICE VISIT NOTE  History:  Holly Herring is a 73 y.o. PMP female here today for pessary maintenance. Patient denies any bleeding or other vaginal symptoms. No pelvic pain or other concerns.   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   Health Maintenance:  Normal mammogram on 06/01/2024.   Review of Systems:  Pertinent items noted in HPI and remainder of comprehensive ROS otherwise negative.  Physical Exam:  BP (!) 167/61 (BP Location: Left Arm, Patient Position: Sitting, Cuff Size: Large)   Pulse 84   Wt 196 lb 1.9 oz (89 kg)   BMI 37.06 kg/m  CONSTITUTIONAL: Well-developed, well-nourished female in no acute distress.  NEUROLOGIC: Alert and oriented to person, place, and time. Normal muscle tone coordination. No cranial nerve deficit noted. PSYCHIATRIC: Normal mood and affect. Normal behavior.  Normal judgment and thought content. CARDIOVASCULAR: Normal heart rate noted RESPIRATORY: Effort and breath sounds normal, no problems with respiration noted ABDOMEN: No masses noted. No other overt distention noted.   PELVIC: Normal external genitalia and normal urethral meatus, healthy vaginal mucosa noted, no concerning lesions, no excoriations.The bladder is prolapsed to the introitus without Valsalva. ?The patient's ring with support pessary and old Estring  was removed.  The cleaned pessary was replaced without complications and is noted to fit adequately. Performed in the presence of a chaperone   Assessment and Plan:     1. Bladder prolapse, female, acquired 2. Pessary maintenance (Primary) Pessary cleaned and  replaced today.    Patient will return every 3 months for pessary maintenance.  Routine preventative health maintenance measures emphasized.  I spent 20 minutes dedicated to the care of this patient including pre-visit review of records, face to face time with the patient discussing her conditions and treatments, post visit ordering of medications and appropriate tests or procedures, coordinating care and documenting this visit encounter.    GLORIS HUGGER, MD, FACOG Obstetrician & Gynecologist, [facility] for [employer], [facility]

## 2024-06-15 ENCOUNTER — Ambulatory Visit (INDEPENDENT_AMBULATORY_CARE_PROVIDER_SITE_OTHER): Admitting: *Deleted

## 2024-06-15 VITALS — Ht 61.0 in | Wt 219.0 lb

## 2024-06-15 DIAGNOSIS — Z0000 Encounter for general adult medical examination without abnormal findings: Secondary | ICD-10-CM | POA: Diagnosis not present

## 2024-06-15 DIAGNOSIS — Z780 Asymptomatic menopausal state: Secondary | ICD-10-CM | POA: Diagnosis not present

## 2024-06-15 NOTE — Progress Notes (Signed)
 Please attest this visit in the absence of patient primary care provider.    Subjective:   Holly Herring is a 73 y.o. who presents for a Medicare Wellness preventive visit.  As a reminder, Annual Wellness Visits don't include a physical exam, and some assessments may be limited, especially if this visit is performed virtually. We may recommend an in-person follow-up visit with your provider if needed.  Visit Complete: Virtual I connected with  Holly Herring on 06/15/24 by a audio enabled telemedicine application and verified that I am speaking with the correct person using two identifiers.  Patient Location: Home  Provider Location: Office/Clinic  I discussed the limitations of evaluation and management by telemedicine. The patient expressed understanding and agreed to proceed.  Vital Signs: Because this visit was a virtual/telehealth visit, some criteria may be missing or patient reported. Any vitals not documented were not able to be obtained and vitals that have been documented are patient reported.  VideoDeclined- This patient declined Librarian, academic. Therefore the visit was completed with audio only.  Persons Participating in Visit: Patient.  AWV Questionnaire: Yes: Patient Medicare AWV questionnaire was completed by the patient on 06/11/24; I have confirmed that all information answered by patient is correct and no changes since this date.  Cardiac Risk Factors include: advanced age (>28men, >58 women);dyslipidemia;hypertension     Objective:    Today's Vitals   06/15/24 1418  Weight: 219 lb (99.3 kg)  Height: 5' 1 (1.549 m)   Body mass index is 41.38 kg/m.     06/15/2024    2:27 PM 06/13/2023    4:15 PM 06/04/2022   11:38 AM 05/09/2021    8:40 AM 03/15/2021    1:31 PM 01/09/2021   12:02 PM 01/03/2021    9:22 AM  Advanced Directives  Does Patient Have a Medical Advance Directive? No No Yes No Yes No No  Type of Aeronautical engineer of [place];Living will  Healthcare Power of [place];Living will    Copy of Healthcare Power of Attorney in Chart?   Yes - validated most recent copy scanned in chart (See row information)  Yes - validated most recent copy scanned in chart (See row information)    Would patient like information on creating a medical advance directive? Yes (MAU/Ambulatory/Procedural Areas - Information given) Yes (MAU/Ambulatory/Procedural Areas - Information given)  No - Patient declined  No - Patient declined No - Patient declined    Current Medications (verified) Outpatient Encounter Medications as of 06/15/2024  Medication Sig   aspirin  EC 81 MG tablet Take 81 mg by mouth daily. Swallow whole.   calcium  carbonate (OS-CAL) 600 MG TABS tablet Take 600 mg by mouth daily with breakfast.   Cod Liver Oil CAPS Take 1 capsule by mouth daily.   diphenhydrAMINE  (SLEEP AID, DIPHENHYDRAMINE ,) 25 MG tablet Take 50 mg by mouth at bedtime.   estradiol  (ESTRACE ) 1 MG tablet Take 1 tablet (1 mg total) by mouth daily.   lisinopril -hydrochlorothiazide  (ZESTORETIC ) 10-12.5 MG tablet Take 1 tablet by mouth daily.   Magnesium  500 MG CAPS Take 500 mg by mouth daily.   POTASSIUM GLUCONATE PO Take 500 mg by mouth daily.   rosuvastatin  (CRESTOR ) 20 MG tablet Take 1 tablet (20 mg total) by mouth daily.   vitamin B-12 (CYANOCOBALAMIN) 1000 MCG tablet Take 1,000 mcg by mouth daily.   vitamin C  (ASCORBIC ACID ) 500 MG tablet Take 500 mg by mouth daily.   Zinc  50 MG CAPS Take 50 mg by mouth daily.   [DISCONTINUED] clindamycin (CLEOCIN) 300 MG capsule Take 300 mg by mouth 3 (three) times daily.   [DISCONTINUED] OVER THE COUNTER MEDICATION Take 1 capsule by mouth in the morning and at bedtime. Golo Release   No facility-administered encounter medications on file as of 06/15/2024.    Allergies (verified) Codeine, Other, Penicillins, and Percocet [oxycodone -acetaminophen ]   History: Past Medical History:  Diagnosis  Date   Arthritis    Female bladder prolapse    Has ring with support pessary   Hypertension    Past Surgical History:  Procedure Laterality Date   ABDOMINAL HYSTERECTOMY  2019   HERNIA REPAIR     KNEE ARTHROSCOPY     Dr Curley   KNEE CLOSED REDUCTION Right 03/20/2021   Procedure: CLOSED MANIPULATION KNEE;  Surgeon: Melodi Lerner, MD;  Location: [facility];  Service: Orthopedics;  Laterality: Right;    OOPHORECTOMY     one ovary removed due to ectopic   REDUCTION MAMMAPLASTY Bilateral 2002   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   Procedure: TOTAL KNEE ARTHROPLASTY;  Surgeon: Lerner JINNY Sensor, MD;  [facility]: [facility];  Service: Orthopedics;  Laterality: Left;  DEPUY/SIGMA   TOTAL KNEE ARTHROPLASTY Right 01/09/2021   Procedure: TOTAL KNEE ARTHROPLASTY;  Surgeon: Melodi Lerner, MD;  Location: [facility];  Service: Orthopedics;  Laterality: Right;    Family History  Problem Relation Age of Onset   Heart disease Mother    Other Father        shot by girlfriend   Heart disease Brother    Social History   Socioeconomic History   Marital status: Widowed    Spouse name: Not on file   Number of children: 2   Years of education: Not on file   Highest education level: 12th grade  Occupational History   Occupation: retired  Tobacco Use   Smoking status: Former    Current packs/day: 0.00    Average packs/day: 0.3 packs/day for 8.0 years (2.0 ttl pk-yrs)    Types: Cigarettes    Start date: 66    Quit date: 11/26/1978    Years since quitting: 45.5   Smokeless tobacco: Never  Vaping Use   Vaping status: Never Used  Substance and Sexual Activity   Alcohol use: Yes    Alcohol/week: 1.0 standard drink of alcohol    Types: 1 Glasses of wine per week    Comment: occasionally   Drug use: No   Sexual activity: Not on file  Other Topics Concern   Not on file  Social History Narrative   Widowed, 2 children both live in [place]   Social Drivers of Health   Financial Resource  Strain: Low Risk  (06/11/2024)   Overall Financial Resource Strain (CARDIA)    Difficulty of Paying Living Expenses: Not very hard  Food Insecurity: No Food Insecurity (06/11/2024)   Hunger Vital Sign    Worried About Running Out of Food in the Last Year: Never true    Ran Out of Food in the Last Year: Never true  Transportation Needs: No Transportation Needs (06/11/2024)   PRAPARE - Administrator, Civil Service (Medical): No    Lack of Transportation (Non-Medical): No  Physical Activity: Sufficiently Active (06/11/2024)   Exercise Vital Sign    Days of Exercise per Week: 6 days    Minutes of Exercise per Session: 30 min  Stress: No Stress Concern Present (06/11/2024)  [employer] of Occupational Health - Occupational Stress Questionnaire    Feeling of Stress: Not at all  Social Connections: Unknown (06/11/2024)   Social Connection and Isolation Panel    Frequency of Communication with Friends and Family: More than three times a week    Frequency of Social Gatherings with Friends and Family: More than three times a week    Attends Religious Services: Not on file    Active Member of Clubs or Organizations: Yes    Attends Banker Meetings: More than 4 times per year    Marital Status: Widowed    Tobacco Counseling Counseling given: Not Answered    Clinical Intake:  Pre-visit preparation completed: Yes  Pain : No/denies pain     BMI - recorded: 41.38 Nutritional Status: BMI > 30  Obese Nutritional Risks: None Diabetes: No  Lab Results  Component Value Date   HGBA1C 6.2 03/25/2023   HGBA1C 5.8 09/20/2021   HGBA1C 5.8 11/29/2020     How often do you need to have someone help you when you read instructions, pamphlets, or other written materials from your doctor or pharmacy?: 1 - Never What is the last grade level you completed in school?: 12th  Interpreter Needed?: No  Information entered by :: Lolita Libra, CMA   Activities of  Daily Living     06/11/2024   11:46 AM  In your present state of health, do you have any difficulty performing the following activities:  Hearing? 0  Vision? 0  Difficulty concentrating or making decisions? 0  Walking or climbing stairs? 0  Dressing or bathing? 0  Doing errands, shopping? 0  Preparing Food and eating ? N  Using the Toilet? N  In the past six months, have you accidently leaked urine? N  Do you have problems with loss of bowel control? N  Managing your Medications? N  Managing your Finances? N  Housekeeping or managing your Housekeeping? N    Patient Care Team: Frann Mabel Mt, DO as PCP - General (Family Medicine)  I have updated your Care Teams any recent Medical Services you may have received from other providers in the past year.     Assessment:   This is a routine wellness examination for [place].  Hearing/Vision screen Hearing Screening - Comments:: Denies hearing difficulties.  Vision Screening - Comments:: Last eye exam within last year, cataract being monitored.      Goals Addressed             This Visit's Progress    Patient Stated       To maintain current health and independence.       Depression Screen     06/15/2024    2:34 PM 10/01/2023    7:51 AM 08/21/2023    1:40 PM 06/13/2023    4:11 PM 09/21/2022    8:32 AM 06/04/2022   11:40 AM 09/20/2021    8:33 AM  PHQ 2/9 Scores  PHQ - 2 Score 0 0 0 0 0 0 0  PHQ- 9 Score 0 0 0 0 0      Fall Risk     06/11/2024   11:46 AM 10/01/2023    7:51 AM 06/06/2023    9:41 AM 03/25/2023    8:34 AM 09/21/2022    8:32 AM  Fall Risk   Falls in the past year? 0 0 0 0 0  Number falls in past yr: 0 0 0 0 0  Injury with Fall? 0  0 0 0 0  Risk for fall due to :   No Fall Risks  No Fall Risks  Follow up  Falls evaluation completed Falls prevention discussed  Falls evaluation completed      Data saved with a previous flowsheet row definition    MEDICARE RISK AT HOME:  Medicare Risk at  Home Any stairs in or around the home?: (Patient-Rptd) No If so, are there any without handrails?: (Patient-Rptd) No Home free of loose throw rugs in walkways, pet beds, electrical cords, etc?: (Patient-Rptd) Yes Adequate lighting in your home to reduce risk of falls?: (Patient-Rptd) Yes Life alert?: (Patient-Rptd) No Use of a cane, walker or w/c?: (Patient-Rptd) No Grab bars in the bathroom?: (Patient-Rptd) Yes Shower chair or bench in shower?: (Patient-Rptd) No Elevated toilet seat or a handicapped toilet?: (Patient-Rptd) Yes  TIMED UP AND GO:  Was the test performed?  No,audio  Cognitive Function: 6CIT completed        06/15/2024    2:45 PM 06/13/2023    4:17 PM  6CIT Screen  What Year? 0 points 0 points  What month? 0 points 0 points  What time? 0 points 0 points  Count back from 20 0 points 0 points  Months in reverse 2 points 2 points  Repeat phrase 4 points 2 points  Total Score 6 points 4 points    Immunizations Immunization History  Administered Date(s) Administered   Moderna Covid-19 Vaccine Bivalent Booster 19yrs & up 08/26/2023   PFIZER(Purple Top)SARS-COV-2 Vaccination 01/17/2020, 02/09/2020, 08/27/2020, 04/12/2021   PNEUMOCOCCAL CONJUGATE-20 03/21/2022   PPD Test 12/12/2012   Pfizer Covid-19 Vaccine Bivalent Booster 90yrs & up 08/24/2021   Pneumococcal Polysaccharide-23 09/22/2019   Tdap 06/05/2022   Zoster Recombinant(Shingrix) 03/08/2022, 06/21/2022    Screening Tests Health Maintenance  Topic Date Due   COVID-19 Vaccine (7 - 2024-25 season) 09/30/2024 (Originally 10/21/2023)   DEXA SCAN  06/18/2024   MAMMOGRAM  06/01/2025   Medicare Annual Wellness (AWV)  06/15/2025   Colonoscopy  09/30/2028   DTaP/Tdap/Td (2 - Td or Tdap) 06/05/2032   Pneumococcal Vaccine: 50+ Years  Completed   Hepatitis C Screening  Completed   Zoster Vaccines- Shingrix  Completed   Hepatitis B Vaccines  Aged Out   HPV VACCINES  Aged Out   Meningococcal B Vaccine  Aged Out    INFLUENZA VACCINE  Discontinued    Health Maintenance  There are no preventive care reminders to display for this patient.  Health Maintenance Items Addressed: DEXA scan ordered. All other HM up to date.  Additional Screening:  Vision Screening: Recommended annual ophthalmology exams for early detection of glaucoma and other disorders of the eye. Would you like a referral to an eye doctor? No    Dental Screening: Recommended annual dental exams for proper oral hygiene  Community Resource Referral / Chronic Care Management: CRR required this visit?  No   CCM required this visit?  No   Plan:    I have personally reviewed and noted the following in the patient's chart:   Medical and social history Use of alcohol, tobacco or illicit drugs  Current medications and supplements including opioid prescriptions. Patient is not currently taking opioid prescriptions. Functional ability and status Nutritional status Physical activity Advanced directives List of other physicians Hospitalizations, surgeries, and ER visits in previous 12 months Vitals Screenings to include cognitive, depression, and falls Referrals and appointments  In addition, I have reviewed and discussed with patient certain preventive protocols, quality metrics, and best  practice recommendations. A written personalized care plan for preventive services as well as general preventive health recommendations were provided to patient.   Lolita Libra, CMA   06/15/2024   After Visit Summary: (MyChart) Due to this being a telephonic visit, the after visit summary with patients personalized plan was offered to patient via MyChart   Notes: Nothing significant to report at this time.

## 2024-06-15 NOTE — Patient Instructions (Signed)
 Holly Herring , Thank you for taking time out of your busy schedule to complete your Annual Wellness Visit with me. I enjoyed our conversation and look forward to speaking with you again next year. I, as well as your care team,  appreciate your ongoing commitment to your health goals. Please review the following plan we discussed and let me know if I can assist you in the future. Your Game plan/ To Do List   Referral has been placed for:   Bone Density @ [facility] [phone]  Follow up Visits: Next Medicare AWV with our clinical staff: 06/16/25 2:20pm    Next Office Visit with your provider: 09/28/24 8am  Clinician Recommendations:  Aim for 30 minutes of exercise or brisk walking, 6-8 glasses of water , and 5 servings of fruits and vegetables each day.       This is a list of the screening recommended for you and due dates:  Health Maintenance  Topic Date Due   Medicare Annual Wellness Visit  06/12/2024   COVID-19 Vaccine (7 - 2024-25 season) 09/30/2024*   DEXA scan (bone density measurement)  06/18/2024   Mammogram  06/01/2025   Colon Cancer Screening  09/30/2028   DTaP/Tdap/Td vaccine (2 - Td or Tdap) 06/05/2032   Pneumococcal Vaccine for age over 62  Completed   Hepatitis C Screening  Completed   Zoster (Shingles) Vaccine  Completed   Hepatitis B Vaccine  Aged Out   HPV Vaccine  Aged Out   Meningitis B Vaccine  Aged Out   Flu Shot  Discontinued  *Topic was postponed. The date shown is not the original due date.   I mailed you a set of Advanced Directives. Let me know if you haven't received them within 1 week.  Once completed and notarized, you may return a copy of your Advanced Directive(s) by either of the following:  Bring a copy of your health care power of attorney and living will to the office to be added to your chart at your convenience. You can mail a copy to [facility] [address] [place], [place] [address] or email to  [email or web address]   Advance Care Planning is important because it:  [x]  Makes sure you receive the medical care that is consistent with your values, goals, and preferences  [x]  It provides guidance to your family and loved ones and reduces their decisional burden about whether or not they are making the right decisions based on your wishes.  Follow the link provided in your after visit summary or read over the paperwork we have mailed to you to help you started getting your Advance Directives in place. If you need assistance in completing these, please reach out to us  so that we can help you!  See attachments for Preventive Care and Fall Prevention Tips.

## 2024-06-25 ENCOUNTER — Ambulatory Visit (HOSPITAL_BASED_OUTPATIENT_CLINIC_OR_DEPARTMENT_OTHER)
Admission: RE | Admit: 2024-06-25 | Discharge: 2024-06-25 | Disposition: A | Discharge status: Home / Self Care | Source: Ambulatory Visit | Attending: Family Medicine | Admitting: Family Medicine

## 2024-06-25 DIAGNOSIS — Z780 Asymptomatic menopausal state: Secondary | ICD-10-CM | POA: Diagnosis not present

## 2024-06-26 ENCOUNTER — Ambulatory Visit: Payer: Self-pay | Admitting: Family Medicine

## 2024-08-04 ENCOUNTER — Telehealth: Payer: Self-pay

## 2024-08-04 ENCOUNTER — Telehealth: Payer: Self-pay | Admitting: Family Medicine

## 2024-08-04 NOTE — Telephone Encounter (Signed)
 Copied from CRM [id number]. Topic: Appointments - Scheduling Inquiry for Clinic >> Aug 04, 2024  8:02 AM Holly Herring wrote: Reason for CRM: patient would like prescription for COVID vaccine. Please follow up with patient

## 2024-08-04 NOTE — Telephone Encounter (Signed)
 Copied from CRM #[id number]. Topic: Clinical - Medication Question >> Aug 04, 2024  8:28 AM Deleta RAMAN wrote: Reason for CRM: patient would like prescription for COVID vaccine. Please follow up with patient. Sent wrong crm to admin pool

## 2024-08-04 NOTE — Telephone Encounter (Signed)
 Called pt was advised per our providers aren't writing Covid vaccine for now stated understand.

## 2024-09-28 ENCOUNTER — Ambulatory Visit: Admitting: Family Medicine

## 2024-10-14 ENCOUNTER — Encounter: Payer: Self-pay | Admitting: Family Medicine

## 2024-10-14 ENCOUNTER — Ambulatory Visit (INDEPENDENT_AMBULATORY_CARE_PROVIDER_SITE_OTHER): Admitting: Family Medicine

## 2024-10-14 VITALS — BP 132/70 | HR 100 | Temp 98.0°F | Resp 16 | Ht 61.0 in | Wt 223.2 lb

## 2024-10-14 DIAGNOSIS — Z0000 Encounter for general adult medical examination without abnormal findings: Secondary | ICD-10-CM

## 2024-10-14 DIAGNOSIS — I10 Essential (primary) hypertension: Secondary | ICD-10-CM

## 2024-10-14 DIAGNOSIS — R7303 Prediabetes: Secondary | ICD-10-CM | POA: Diagnosis not present

## 2024-10-14 LAB — COMPREHENSIVE METABOLIC PANEL WITH GFR
ALT: 21 U/L (ref 0–35)
AST: 27 U/L (ref 0–37)
Albumin: 4.2 g/dL (ref 3.5–5.2)
Alkaline Phosphatase: 47 U/L (ref 39–117)
BUN: 16 mg/dL (ref 6–23)
CO2: 28 meq/L (ref 19–32)
Calcium: 9.8 mg/dL (ref 8.4–10.5)
Chloride: 101 meq/L (ref 96–112)
Creatinine, Ser: 0.85 mg/dL (ref 0.40–1.20)
GFR: 68.75 mL/min (ref >60.00)
Glucose, Bld: 108 mg/dL — ABNORMAL HIGH (ref 70–99)
Potassium: 4 meq/L (ref 3.5–5.1)
Sodium: 139 meq/L (ref 135–145)
Total Bilirubin: 0.4 mg/dL (ref 0.2–1.2)
Total Protein: 7.6 g/dL (ref 6.0–8.3)

## 2024-10-14 LAB — CBC
HCT: 39 % (ref 36.0–46.0)
Hemoglobin: 13 g/dL (ref 12.0–15.0)
MCHC: 33.4 g/dL (ref 30.0–36.0)
MCV: 84.4 fl (ref 78.0–100.0)
Platelets: 312 K/uL (ref 150.0–400.0)
RBC: 4.62 Mil/uL (ref 3.87–5.11)
RDW: 15 % (ref 11.5–15.5)
WBC: 6.1 K/uL (ref 4.0–10.5)

## 2024-10-14 LAB — LIPID PANEL
Cholesterol: 164 mg/dL (ref 0–200)
HDL: 100.9 mg/dL (ref >39.00)
LDL Cholesterol: 54 mg/dL (ref 0–99)
NonHDL: 63.4
Total CHOL/HDL Ratio: 2
Triglycerides: 49 mg/dL (ref 0.0–149.0)
VLDL: 9.8 mg/dL (ref 0.0–40.0)

## 2024-10-14 NOTE — Patient Instructions (Addendum)
 Give us  2-3 business days to get the results of your labs back.   Keep the diet clean and stay active.  Please consider adding some weight resistance exercise to your routine. Consider yoga as well.   Foods that may reduce pain: 1) Ginger 2) Blueberries 3) Salmon 4) Pumpkin seeds 5) Dark chocolate 6) Turmeric 7) Tart cherries 8) Virgin olive oil 9) Chili peppers 10) Mint 11) Krill oil  Let us  know if you need anything.

## 2024-10-14 NOTE — Progress Notes (Signed)
 Chief Complaint  Patient presents with   Annual Exam    CPE     Well Woman Holly Herring is here for a complete physical.   Her last physical was >1 year ago.  Current diet: in general, a healthy diet. Current exercise: walking. Weight is stable and she denies daytime fatigue. Seatbelt? Yes Advanced directive? Yes  Health Maintenance Colonoscopy- Yes Shingrix- Yes DEXA- Yes Mammogram- Yes Tetanus- Yes Pneumonia- Yes Hep C screen- Yes  Past Medical History:  Diagnosis Date   Arthritis    Female bladder prolapse    Has ring with support pessary   Hypertension      Past Surgical History:  Procedure Laterality Date   ABDOMINAL HYSTERECTOMY  2019   HERNIA REPAIR     KNEE ARTHROSCOPY     Dr Curley   KNEE CLOSED REDUCTION Right 03/20/2021   Procedure: CLOSED MANIPULATION KNEE;  Surgeon: Melodi Lerner, MD;  Location: [facility];  Service: Orthopedics;  Laterality: Right;    OOPHORECTOMY     one ovary removed due to ectopic   REDUCTION MAMMAPLASTY Bilateral 2002   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 Swallow whole.     calcium  carbonate (OS-CAL) 600 MG TABS tablet Take 600 mg by mouth daily with breakfast.     Cod Liver Oil CAPS Take 1 capsule by mouth daily.     diphenhydrAMINE  (SLEEP AID, DIPHENHYDRAMINE ,) 25 MG tablet Take 50 mg by mouth at bedtime.     estradiol  (ESTRACE ) 1 MG tablet Take 1 tablet (1 mg total) by mouth daily. 90 tablet 3   lisinopril -hydrochlorothiazide  (ZESTORETIC )  10-12.5 MG tablet Take 1 tablet by mouth daily. 90 tablet 3   Magnesium  500 MG CAPS Take 500 mg by mouth daily.     POTASSIUM GLUCONATE PO Take 500 mg by mouth daily.     rosuvastatin  (CRESTOR ) 20 MG tablet Take 1 tablet (20 mg total) by mouth daily. 90 tablet 3   vitamin B-12 (CYANOCOBALAMIN) 1000 MCG tablet Take 1,000 mcg by mouth daily.     vitamin C  (ASCORBIC ACID ) 500 MG tablet Take 500 mg by mouth daily.     Zinc 50 MG CAPS Take 50 mg by mouth daily.      Allergies Allergies  Allergen Reactions   Codeine Itching    Can't sleep    Other     NO BLOOD OR BLOOD PRODUCTS   Penicillins Hives    Tolerated Cephalosporin Date: 01/10/21.     Percocet [Oxycodone -Acetaminophen ] Itching    Has to take benadryl  prior    Review of Systems: Constitutional:  no fevers Eye:  no recent significant change in vision Ears:  No changes in hearing Nose/Mouth/Throat:  no complaints of nasal congestion, no sore throat Cardiovascular: no chest pain Respiratory:  No shortness of breath Gastrointestinal:  No change in bowel habits GU:  Female: negative for dysuria Integumentary:  no abnormal skin lesions reported Neurologic:  no headaches Endocrine:  denies unexplained weight changes  Exam BP  132/70 (BP Location: Left Arm, Patient Position: Sitting)   Pulse 100   Temp 98 F (36.7 C) (Oral)   Resp 16   Ht 5' 1 (1.549 m)   Wt 223 lb 3.2 oz (101.2 kg)   SpO2 98%   BMI 42.17 kg/m  General:  well developed, well nourished, in no apparent distress Skin:  no significant moles, warts, or growths Head:  no masses, lesions, or tenderness Eyes:  pupils equal and round, sclera anicteric without injection Ears:  canals without lesions, TMs shiny without retraction, no obvious effusion, no erythema Nose:  nares patent, mucosa normal, and no drainage Throat/Pharynx:  lips and gingiva without lesion; tongue and uvula midline; non-inflamed pharynx; no exudates or postnasal drainage Neck: neck supple  without adenopathy, thyromegaly, or masses Lungs:  clear to auscultation, breath sounds equal bilaterally, no respiratory distress Cardio:  regular rate and rhythm, no bruits or LE edema Abdomen:  abdomen soft, nontender; bowel sounds normal; no masses or organomegaly Genital: Deferred Neuro:  gait normal; deep tendon reflexes normal and symmetric Psych: well oriented with normal range of affect and appropriate judgment/insight  Assessment and Plan  Well adult exam  Essential hypertension - Plan: CBC, Comprehensive metabolic panel with GFR, Lipid panel  Prediabetes - Plan: Hemoglobin A1c   Well 73 y.o. female. Counseled on diet and exercise. Add some strength training.  Other orders as above. Follow up in 6 mo. The patient voiced understanding and agreement to the plan.  Holly Mt [place], DO 10/14/24 7:22 AM

## 2024-10-15 ENCOUNTER — Ambulatory Visit: Payer: Self-pay | Admitting: Family Medicine

## 2024-10-15 LAB — HEMOGLOBIN A1C: Hgb A1c MFr Bld: 6.1 % (ref 4.6–6.5)

## 2024-12-30 ENCOUNTER — Ambulatory Visit: Admitting: Obstetrics and Gynecology

## 2024-12-30 VITALS — BP 152/54 | HR 93 | Wt 227.0 lb

## 2024-12-30 DIAGNOSIS — N958 Other specified menopausal and perimenopausal disorders: Secondary | ICD-10-CM

## 2024-12-30 DIAGNOSIS — Z4689 Encounter for fitting and adjustment of other specified devices: Secondary | ICD-10-CM

## 2024-12-30 DIAGNOSIS — Z79890 Hormone replacement therapy (postmenopausal): Secondary | ICD-10-CM

## 2024-12-30 MED ORDER — ESTRADIOL 0.01 % VA CREA: TOPICAL_CREAM | VAGINAL | 12 refills | Status: AC

## 2024-12-30 NOTE — Progress Notes (Signed)
" ° °  ESTABLISHED GYNECOLOGY VISIT Chief Complaint  Patient presents with   Pessary Check    Subjective:  Holly Herring is a 74 y.o. G2P2000 presenting for pessary check.  She uses a pessary for prolapse. Denies pelvic pain, vaginal bleeding or discharge. Requests refill on vaginal estrogen  She also reports using oral estradiol  1 mg daily. Has been taking this for several years. Has had hysterectomy in 2019.  Review of Systems:   Pertinent items are noted in HPI  Pertinent History Reviewed:  Reviewed past medical,surgical, social and family history.  Reviewed problem list, medications and allergies.  Objective:   Vitals:   12/30/24 1408 12/30/24 1420  BP: (!) 165/68 (!) 152/54  Pulse: 94 93  Weight: 227 lb (103 kg)    Physical Examination:   General appearance - well appearing, and in no distress  Mental status - alert, oriented to person, place, and time  Psych:  normal mood and affect  Skin - warm and dry, normal color, no suspicious lesions noted  Abdomen - soft, nontender, nondistended, no masses or organomegaly  Pelvic -  VULVA: normal appearing vulva with no masses, tenderness or lesions   VAGINA: normal appearing vagina with normal color and discharge, no lesions / erosions  CERVIX: normal appearing cervix without discharge or lesions Pessary removed, cleaned, replaced  Extremities:  No swelling or varicosities noted  Chaperone present for exam  Assessment and Plan:  1. Pessary maintenance (Primary) Continue pessary  2. Hormone replacement therapy Counseled patient to consider discontinuing oral estrogen due to VTE risks  3. Genitourinary syndrome of menopause Refill given - estradiol  (ESTRACE ) 0.01 % CREA vaginal cream; Apply 1 gram per vagina two to three times a week  Dispense: 30 g; Refill: 12    No follow-ups on file.  Future Appointments  Date Time Provider Department Center  04/14/2025  7:00 AM Frann Mabel Mt, [place] [facility]  06/16/2025  2:20 PM [facility] VISIT 1 [facility] Ferdie Rollo ONEIDA Abigail, MD, FACOG Obstetrician & Gynecologist, [facility] for [facility], [facility] "

## 2025-04-14 ENCOUNTER — Ambulatory Visit: Admitting: Family Medicine

## 2025-06-16 ENCOUNTER — Ambulatory Visit
